# Patient Record
Sex: Female | Born: 1945
Health system: Southern US, Community
[De-identification: ages and names within clinical notes are randomized; demographics above are authoritative.]

## PROBLEM LIST (undated history)

## (undated) DIAGNOSIS — I10 Essential (primary) hypertension: Secondary | ICD-10-CM

## (undated) DIAGNOSIS — I491 Atrial premature depolarization: Secondary | ICD-10-CM

## (undated) DIAGNOSIS — K759 Inflammatory liver disease, unspecified: Secondary | ICD-10-CM

## (undated) DIAGNOSIS — E785 Hyperlipidemia, unspecified: Secondary | ICD-10-CM

## (undated) DIAGNOSIS — I4891 Unspecified atrial fibrillation: Secondary | ICD-10-CM

## (undated) DIAGNOSIS — I219 Acute myocardial infarction, unspecified: Secondary | ICD-10-CM

## (undated) DIAGNOSIS — I639 Cerebral infarction, unspecified: Secondary | ICD-10-CM

## (undated) DIAGNOSIS — Z87442 Personal history of urinary calculi: Secondary | ICD-10-CM

## (undated) HISTORY — DX: Hyperlipidemia, unspecified: E78.5

## (undated) HISTORY — DX: Unspecified atrial fibrillation: I48.91

## (undated) HISTORY — DX: Atrial premature depolarization: I49.1

## (undated) HISTORY — DX: Cerebral infarction, unspecified: I63.9

## (undated) HISTORY — DX: Essential (primary) hypertension: I10

## (undated) HISTORY — DX: Inflammatory liver disease, unspecified: K75.9

## (undated) HISTORY — PX: SHOULDER ARTHROSCOPY W/ ROTATOR CUFF REPAIR: SHX2400

## (undated) HISTORY — PX: HEMORROIDECTOMY: SUR656

## (undated) HISTORY — PX: TOTAL HIP ARTHROPLASTY: SHX124

---

## 2008-09-07 ENCOUNTER — Encounter: Admission: RE | Admit: 2008-09-07 | Discharge: 2008-09-07 | Payer: Self-pay | Admitting: Orthopedic Surgery

## 2009-02-03 ENCOUNTER — Inpatient Hospital Stay (HOSPITAL_COMMUNITY): Admission: RE | Admit: 2009-02-03 | Discharge: 2009-02-07 | Payer: Self-pay | Admitting: Neurosurgery

## 2009-02-03 ENCOUNTER — Ambulatory Visit: Payer: Self-pay | Admitting: Internal Medicine

## 2009-02-03 ENCOUNTER — Ambulatory Visit: Payer: Self-pay | Admitting: Cardiology

## 2009-02-04 ENCOUNTER — Encounter: Payer: Self-pay | Admitting: Pulmonary Disease

## 2009-02-07 ENCOUNTER — Encounter (INDEPENDENT_AMBULATORY_CARE_PROVIDER_SITE_OTHER): Payer: Self-pay | Admitting: Neurosurgery

## 2009-03-04 DIAGNOSIS — I471 Supraventricular tachycardia: Secondary | ICD-10-CM | POA: Insufficient documentation

## 2009-03-04 DIAGNOSIS — R7881 Bacteremia: Secondary | ICD-10-CM | POA: Insufficient documentation

## 2009-03-04 DIAGNOSIS — K759 Inflammatory liver disease, unspecified: Secondary | ICD-10-CM | POA: Insufficient documentation

## 2009-03-04 DIAGNOSIS — E785 Hyperlipidemia, unspecified: Secondary | ICD-10-CM | POA: Insufficient documentation

## 2009-03-04 DIAGNOSIS — I10 Essential (primary) hypertension: Secondary | ICD-10-CM | POA: Insufficient documentation

## 2009-03-07 ENCOUNTER — Encounter: Payer: Self-pay | Admitting: Internal Medicine

## 2009-03-07 ENCOUNTER — Ambulatory Visit: Payer: Self-pay | Admitting: Internal Medicine

## 2009-05-05 ENCOUNTER — Ambulatory Visit: Payer: Self-pay | Admitting: Internal Medicine

## 2009-12-16 ENCOUNTER — Encounter: Admission: RE | Admit: 2009-12-16 | Discharge: 2009-12-16 | Payer: Self-pay | Admitting: Neurosurgery

## 2010-03-28 ENCOUNTER — Telehealth: Payer: Self-pay | Admitting: Internal Medicine

## 2010-05-03 ENCOUNTER — Ambulatory Visit: Payer: Self-pay | Admitting: Internal Medicine

## 2010-07-10 ENCOUNTER — Telehealth: Payer: Self-pay | Admitting: Internal Medicine

## 2010-07-17 ENCOUNTER — Ambulatory Visit: Payer: Self-pay | Admitting: Internal Medicine

## 2010-07-17 DIAGNOSIS — R0789 Other chest pain: Secondary | ICD-10-CM | POA: Insufficient documentation

## 2010-07-20 ENCOUNTER — Telehealth (INDEPENDENT_AMBULATORY_CARE_PROVIDER_SITE_OTHER): Payer: Self-pay | Admitting: *Deleted

## 2010-07-24 ENCOUNTER — Encounter: Payer: Self-pay | Admitting: Internal Medicine

## 2010-07-24 ENCOUNTER — Ambulatory Visit: Payer: Self-pay

## 2010-07-24 ENCOUNTER — Encounter (HOSPITAL_COMMUNITY): Admission: RE | Admit: 2010-07-24 | Discharge: 2010-08-24 | Payer: Self-pay | Admitting: Internal Medicine

## 2010-07-24 ENCOUNTER — Ambulatory Visit: Payer: Self-pay | Admitting: Internal Medicine

## 2010-10-18 ENCOUNTER — Ambulatory Visit: Payer: Self-pay | Admitting: Internal Medicine

## 2011-01-02 NOTE — Assessment & Plan Note (Signed)
Summary: Cardiology Nuclear Testing  Nuclear Med Background Indications for Stress Test: Evaluation for Ischemia   History: Echo, GXT  History Comments: '10 Echo:normal  Symptoms: Chest Pain, Diaphoresis, Fatigue, Palpitations, Rapid HR  Symptoms Comments: Last episode of CP:3 weeks ago.   Nuclear Pre-Procedure Cardiac Risk Factors: Family History - CAD, Hypertension, Lipids, Smoker Caffeine/Decaff Intake: None NPO After: 5:30 AM Lungs: Clear IV 0.9% NS with Angio Cath: 22g     IV Site: (R) Forearm IV Started by: Irean Hong RN Chest Size (in) 34     Cup Size B     Height (in): 66 Weight (lb): 132 BMI: 21.38 Tech Comments: Held Diltiazem x 48 hours per MD. Patient had multiple runs of SVT with exercise and in recovery.  Spoke with Dr. Johney Frame, he said for patient to go back on her Diltiazem.  Advised patient, she had not started the 240mg  that he increased her to yet because it was mail ordered, she's still on the 180mg .  [employer], CMA-N  Nuclear Med Study 1 or 2 day study:  1 day     Stress Test Type:  Stress Reading MD:  Arvilla Meres, MD     Referring MD:  Hillis Range, MD Resting Radionuclide:  Technetium 92m Tetrofosmin     Resting Radionuclide Dose:  11 mCi  Stress Radionuclide:  Technetium 3m Tetrofosmin     Stress Radionuclide Dose:  33 mCi   Stress Protocol Exercise Time (min):  7:01 min     Max HR:  153 bpm     Predicted Max HR:  156 bpm  Max Systolic BP: 175 mm Hg     Percent Max HR:  98.08 %     METS: 8.5 Rate Pressure Product:  09811    Stress Test Technologist:  [employer] CMA-N     Nuclear Technologist:  Domenic Polite CNMT  Rest Procedure  Myocardial perfusion imaging was performed at rest 45 minutes following the intravenous administration of Myoview Technetium 44m Tetrofosmin.  Stress Procedure  The patient exercised for 7:01.  The patient stopped due to fatigue and denied any chest pain.  There were no diagnostic ST-T wave changes, but she  did have multiple runs of SVT.  Myoview was injected at peak exercise and myocardial perfusion imaging was performed after a brief delay.  QPS Raw Data Images:  Normal; no motion artifact; normal heart/lung ratio. Stress Images:  Mildly decreased uptake in the inferior wall and inferoseptum. Rest Images:  Mildly decreased uptake in the inferoseptum septum Subtraction (SDS):  Possible inferoseptal infarct with very mild ischemia in the inferior wall and septum. Transient Ischemic Dilatation:  .92  (Normal <1.22)  Lung/Heart Ratio:  .26  (Normal <0.45)  Quantitative Gated Spect Images QGS EDV:  53 ml QGS ESV:  17 ml QGS EF:  69 % QGS cine images:  Normal  Findings Abormal nuclear study Evidence for inferior ischemia      Overall Impression  Exercise Capacity: Fair exercise capacity. BP Response: Normal blood pressure response. Clinical Symptoms: There is dyspnea. ECG Impression: Insignificant upsloping ST segment depression. Very brief runs of SVT. Overall Impression: Abnormal stress nuclear study. Overall Impression Comments: Possible inferoseptal infarct with very mild ischemia in the inferior wall and septum.  Appended Document: Cardiology Nuclear Testing Low risk myoview.  Continue current management. She had frequent episodes of nonsustained atrial tachycardia during the study. We have increased cardizem recently to 240mg  daily.  IF her episodes of palpitations continue or worsen, we should  consider increase cardizem to 360mg  daily.  SHe may also require an antiarrhythmic medicine at that time.  I am scheduled to see her in Nov.  She should contact us if things worsen and we could see her sooner.  Appended Document: Cardiology Nuclear Testing increased dose of Diltiazem 240mg  was just started on 07/25/10 per pt

## 2011-01-02 NOTE — Progress Notes (Signed)
Summary: Nuc Pre-Procedure   Nuclear Med Background Indications for Stress Test: Evaluation for Ischemia   History: Echo  History Comments: 2010 Echo- Ef-Nml  Symptoms: Chest Pain, Diaphoresis, Fatigue    Nuclear Pre-Procedure Cardiac Risk Factors: Hypertension, Lipids Height (in): 66

## 2011-01-02 NOTE — Progress Notes (Signed)
Summary: Nuc Pre-Procedure  Phone Note Outgoing Call Call back at [facility] Phone [phone]   Call placed by: Antionette Char RN,  July 20, 2010 1:54 PM Call placed to: Patient Reason for Call: Confirm/change Appt Summary of Call: Reviewed information on Myoview Information Sheet (see scanned document for further details).  Spoke with patient.

## 2011-01-02 NOTE — Assessment & Plan Note (Signed)
Summary: rov.appt is 1:30/ gd   Visit Type:  Follow-up Primary Provider:  Abner Greenspan, MD   History of Present Illness: The patient presents today for cardiology followup. She reports having tachy palpitations 2 weeks ago, lasting 1 hour with diaphoresis while eating dinner.  This episode resolved spontaneously.  She also reports sharp fleeting chest pain intermittently over the past 2 weeks with associated fatigue.   The patient denies any symptoms of  shortness of breath, orthopnea, PND, lower extremity edema, dizziness, presyncope, syncope, or neurologic sequela. The patient is tolerating medications without difficulties and is otherwise without complaint today.   Current Medications (verified): 1)  Lisinopril 10 Mg Tabs (Lisinopril) .... Take One Tablet By Mouth Daily 2)  Ursodiol 300 Mg Caps (Ursodiol) .Marland Kitchen.. 1 By Mouth Once Daily 3)  Acyclovir 200 Mg Caps (Acyclovir) .... 2 Tabs Once Daily 4)  Ra Acid Reducer Max St 150 Mg Tabs (Ranitidine Hcl) .Marland Kitchen.. 1 By Mouth Once Daily 5)  Bayer Aspirin 325 Mg Tabs (Aspirin) .... Take One Tablet Once Daily 6)  Multivitamins   Tabs (Multiple Vitamin) .Marland Kitchen.. 1 By Mouth Once Daily 7)  Calcium/vitamin D/minerals 600-200 Mg-Unit Tabs (Calcium Carbonate-Vit D-Min) .... 2 Tabs Daily 8)  Actonel 150 Mg Tabs (Risedronate Sodium) .Marland Kitchen.. 1 By Mouth Qmonth 9)  Prempro 0.625-2.5 Mg Tabs (Conj Estrog-Medroxyprogest Ace) .... Once Daily 10)  Diltiazem Hcl Er Beads 180 Mg Xr24h-Cap (Diltiazem Hcl Er Beads) .... Take One Capsule By Mouth Daily 11)  Welchol 625 Mg Tabs (Colesevelam Hcl) .... 6 Tabs Once Daily 12)  Fish Oil   Oil (Fish Oil) .... 3 Capsules Once Daily  Allergies (verified): No Known Drug Allergies  Past History:  Past Medical History: Reviewed history from 05/05/2009 and no changes required. HEPATITIS (ICD-573.3) HYPERLIPIDEMIA-MIXED (ICD-272.4) HYPERTENSION, UNSPECIFIED (ICD-401.9) POST-OPERATIVE ATRIAL FIBRILLATION (PAROXYSMAL) SYMPTOMATIC  PACS  Past Surgical History: Reviewed history from 03/07/2009 and no changes required.  Status post anterolateral decompression L2-L3 and L3-L4 with diskectomy and plating under general anesthesia on February 03, 2009.      Social History: Reviewed history from 03/04/2009 and no changes required.   She lives in [place], [place]   [place].  Married  Tobacco Use - No.  Alcohol Use - no  Review of Systems       All systems are reviewed and negative except as listed in the HPI.   Vital Signs:  Patient profile:   65 year old female Height:      66 inches Weight:      135 pounds BMI:     21.87 Pulse rate:   84 / minute BP sitting:   118 / 70  (left arm)  Vitals Entered By: Laurance Flatten CMA (July 17, 2010 1:54 PM)  Physical Exam  General:  Well developed, well nourished, in no acute distress. Head:  normocephalic and atraumatic Eyes:  PERRLA/EOM intact; conjunctiva and lids normal. Mouth:  Teeth, gums and palate normal. Oral mucosa normal. Neck:  Neck supple, no JVD. No masses, thyromegaly or abnormal cervical nodes. Lungs:  Clear bilaterally to auscultation and percussion. Heart:  Non-displaced PMI, chest non-tender; regular rate and rhythm, S1, S2 without murmurs, rubs or gallops. Carotid upstroke normal, no bruit. Normal abdominal aortic size, no bruits. Femorals normal pulses, no bruits. Pedals normal pulses. No edema, no varicosities. Abdomen:  Bowel sounds positive; abdomen soft and non-tender without masses, organomegaly, or hernias noted. No hepatosplenomegaly. Msk:  Back normal, normal gait. Muscle strength and tone normal. Pulses:  pulses normal  in all 4 extremities Extremities:  No clubbing or cyanosis. Neurologic:  Alert and oriented x 3. Psych:  very anxious today, mildly pressured speech   EKG  Procedure date:  07/17/2010  Findings:      sinus rhythm 86 bpm, otherwise normal ekg  Impression & Recommendations:  Problem # 1:  ATRIAL FIBRILLATION  (ICD-427.31)  Recent palpitations are likely due to afib.  I will continue ASA and increase cardizem DC to 240mg  daily today.  If she has further palpitations, we will consider event monitor placement.  Her updated medication list for this problem includes:    Bayer Aspirin 325 Mg Tabs (Aspirin) .Marland Kitchen... Take one tablet once daily  Problem # 2:  OTHER CHEST PAIN (ICD-786.59)  The patient has symptoms of atypical chest pain.  We will obtain GXT Myoview to evaluate for ischemia as a cause.  Her updated medication list for this problem includes:    Lisinopril 10 Mg Tabs (Lisinopril) .Marland Kitchen... Take one tablet by mouth daily    Bayer Aspirin 325 Mg Tabs (Aspirin) .Marland Kitchen... Take one tablet once daily    Diltiazem Hcl Er Beads 240 Mg Xr24h-cap (Diltiazem hcl er beads) .Marland Kitchen... Take one capsule by mouth daily  Problem # 3:  HYPERLIPIDEMIA-MIXED (ICD-272.4)  stable Her updated medication list for this problem includes:    Welchol 625 Mg Tabs (Colesevelam hcl) .Marland KitchenMarland KitchenMarland KitchenMarland Kitchen 6 tabs once daily  Her updated medication list for this problem includes:    Welchol 625 Mg Tabs (Colesevelam hcl) .Marland KitchenMarland KitchenMarland KitchenMarland Kitchen 6 tabs once daily  Other Orders: EKG w/ Interpretation (93000) Nuclear Stress Test (Nuc Stress Test)  Patient Instructions: 1)  Your physician recommends that you schedule a follow-up appointment in: 3 months with Dr Johney Frame 2)  Your physician has requested that you have an exercise stress myoview.  For further information please visit [email or web address]  Please follow instruction sheet, as given. 3)  Your physician has recommended you make the following change in your medication: increase Cardizem to 240mg  daily Prescriptions: DILTIAZEM HCL ER BEADS 240 MG XR24H-CAP (DILTIAZEM HCL ER BEADS) Take one capsule by mouth daily  #90 x 3   Entered by:   Dennis Bast, RN, BSN   Authorized by:   Hillis Range, MD   Signed by:   Dennis Bast, RN, BSN on 07/17/2010   Method used:   Faxed to ...       [facility] (mail-order)              , [place]         Ph: [phone]       Fax: [phone]   RxID:   [id number]

## 2011-01-02 NOTE — Assessment & Plan Note (Signed)
Summary: yearly   Visit Type:  Follow-up Primary Provider:  Abner Greenspan, MD   History of Present Illness: The patient presents today for routine electrophysiology followup. She reports doing very well since last being seen in our clinic. SHe has had no symptomatic atrial fibrillation over the past year.  Her palpitations/ PACs have significantly improved with diltiazem.  The patient denies any symptoms of palpitations, chest pain, shortness of, orthopnea, PND, lower extremity edema, dizziness, presyncope, syncope, or neurologic sequela. The patient is tolerating medications without difficulties and is otherwise without complaint today.   Current Medications (verified): 1)  Lisinopril 10 Mg Tabs (Lisinopril) .... Take One Tablet By Mouth Daily 2)  Ursodiol 300 Mg Caps (Ursodiol) .Marland Kitchen.. 1 By Mouth Once Daily 3)  Acyclovir 200 Mg Caps (Acyclovir) .... 2 Tabs Once Daily 4)  Ra Acid Reducer Max St 150 Mg Tabs (Ranitidine Hcl) .Marland Kitchen.. 1 By Mouth Once Daily 5)  Bayer Aspirin 325 Mg Tabs (Aspirin) .... Take One Tablet Once Daily 6)  Multivitamins   Tabs (Multiple Vitamin) .Marland Kitchen.. 1 By Mouth Once Daily 7)  Calcium/vitamin D/minerals 600-200 Mg-Unit Tabs (Calcium Carbonate-Vit D-Min) .... 2 Tabs Daily 8)  Actonel 150 Mg Tabs (Risedronate Sodium) .Marland Kitchen.. 1 By Mouth Qmonth 9)  Prempro 0.625-2.5 Mg Tabs (Conj Estrog-Medroxyprogest Ace) .... Once Daily 10)  Diltiazem Hcl Er Beads 180 Mg Xr24h-Cap (Diltiazem Hcl Er Beads) .... Take One Capsule By Mouth Daily 11)  Welchol 625 Mg Tabs (Colesevelam Hcl) .... 6 Tabs Once Daily 12)  Lyrica 75 Mg Caps (Pregabalin) .Marland Kitchen.. 1 Capsule Once Daily 13)  Fish Oil   Oil (Fish Oil) .... 3 Capsules Once Daily  Allergies (verified): No Known Drug Allergies  Past History:  Past Medical History: Reviewed history from 05/05/2009 and no changes required. HEPATITIS (ICD-573.3) HYPERLIPIDEMIA-MIXED (ICD-272.4) HYPERTENSION, UNSPECIFIED (ICD-401.9) POST-OPERATIVE ATRIAL FIBRILLATION  (PAROXYSMAL) SYMPTOMATIC PACS  Past Surgical History: Reviewed history from 03/07/2009 and no changes required.  Status post anterolateral decompression L2-L3 and L3-L4 with diskectomy and plating under general anesthesia on February 03, 2009.      Vital Signs:  Patient profile:   65 year old female Height:      66 inches Weight:      140 pounds BMI:     22.68 Pulse rate:   90 / minute Pulse rhythm:   regular BP sitting:   130 / 82  (left arm)  Vitals Entered By: Laurance Flatten CMA (May 03, 2010 11:10 AM)  Physical Exam  General:  Well developed, well nourished, in no acute distress. Head:  normocephalic and atraumatic Eyes:  PERRLA/EOM intact; conjunctiva and lids normal. Mouth:  Teeth, gums and palate normal. Oral mucosa normal. Neck:  Neck supple, no JVD. No masses, thyromegaly or abnormal cervical nodes. Lungs:  Clear bilaterally to auscultation and percussion. Heart:  Non-displaced PMI, chest non-tender; regular rate and rhythm, S1, S2 without murmurs, rubs or gallops. Carotid upstroke normal, no bruit. Normal abdominal aortic size, no bruits. Femorals normal pulses, no bruits. Pedals normal pulses. No edema, no varicosities. Abdomen:  Bowel sounds positive; abdomen soft and non-tender without masses, organomegaly, or hernias noted. No hepatosplenomegaly. Msk:  Back normal, normal gait. Muscle strength and tone normal. Pulses:  pulses normal in all 4 extremities Extremities:  No clubbing or cyanosis. Neurologic:  Alert and oriented x 3.   EKG  Procedure date:  05/03/2010  Findings:      sinus rhythm 90 bpm, otherwise normal ekg  Impression & Recommendations:  Problem #  1:  ATRIAL FIBRILLATION (ICD-427.31) The patient has had no further symptomatic episodes of atrial fibrillation. She reports pacs/ palpitations are much improved with diltiazem.  I have therefore continued diltiazem longterm.  Her CHADS2 score is 1 and she is appropriately anticoagulated with aspirin.  We  discussed both pradaxa and coumadin as alternatives to aspirin.  She is clear in her decision to continue ASA.  No other changes were made today.  Problem # 2:  HYPERTENSION, UNSPECIFIED (ICD-401.9) stable no changes today  Patient Instructions: 1)  return in 12 months

## 2011-01-02 NOTE — Progress Notes (Signed)
Summary: pt wants sooner appt  Phone Note Call from Patient Call back at Home Phone [phone]   Caller: Patient Reason for Call: Talk to Nurse, Talk to Doctor Summary of Call: pt wants to come in to be seen before first avail. When I asked what problem was she having she said something is not right in her chest and she just needs to see him and she knows it. I asked was she having chest pain and she said no. Initial call taken by: Omer Jack,  July 10, 2010 10:41 AM  Follow-up for Phone Call        Mon at 1:30 to see Dr Johney Frame Dennis Bast, RN, BSN  July 12, 2010 9:28 AM

## 2011-01-02 NOTE — Assessment & Plan Note (Signed)
Summary: 3 [email or web address]   Visit Type:  Follow-up Primary Provider:  Abner Greenspan, MD  CC:  3 month ROV; No complaints.  History of Present Illness: The patient presents today for cardiology followup. She reports very well since last being seen in our office. She reports short fleeting palpitations but denies and prolonged episodes of tachycardia.  She feels that her rhythm is controlled with cardizem.   The patient denies any symptoms of chest pain, shortness of breath, orthopnea, PND, lower extremity edema, dizziness, presyncope, syncope, or neurologic sequela. The patient is tolerating medications without difficulties and is otherwise without complaint today.   Problems Prior to Update: 1)  Other Chest Pain  (ICD-786.59) 2)  Hyperlipidemia-mixed  (ICD-272.4) 3)  Atrial Fibrillation  (ICD-427.31) 4)  Hepatitis  (ICD-573.3) 5)  Hyperlipidemia-mixed  (ICD-272.4) 6)  Hypertension, Unspecified  (ICD-401.9) 7)  Atrial Fibrillation With Rapid Ventricular Response  (ICD-427.31) 8)  Bacteremia  (ICD-790.7)  Medications Prior to Update: 1)  Lisinopril 10 Mg Tabs (Lisinopril) .... Take One Tablet By Mouth Daily 2)  Ursodiol 300 Mg Caps (Ursodiol) .Marland Kitchen.. 1 By Mouth Once Daily 3)  Acyclovir 200 Mg Caps (Acyclovir) .... 2 Tabs Once Daily 4)  Ra Acid Reducer Max St 150 Mg Tabs (Ranitidine Hcl) .Marland Kitchen.. 1 By Mouth Once Daily 5)  Bayer Aspirin 325 Mg Tabs (Aspirin) .... Take One Tablet Once Daily 6)  Multivitamins   Tabs (Multiple Vitamin) .Marland Kitchen.. 1 By Mouth Once Daily 7)  Calcium/vitamin D/minerals 600-200 Mg-Unit Tabs (Calcium Carbonate-Vit D-Min) .... 2 Tabs Daily 8)  Actonel 150 Mg Tabs (Risedronate Sodium) .Marland Kitchen.. 1 By Mouth Qmonth 9)  Prempro 0.625-2.5 Mg Tabs (Conj Estrog-Medroxyprogest Ace) .... Once Daily 10)  Diltiazem Hcl Er Beads 240 Mg Xr24h-Cap (Diltiazem Hcl Er Beads) .... Take One Capsule By Mouth Daily 11)  Welchol 625 Mg Tabs (Colesevelam Hcl) .... 6 Tabs Once Daily 12)  Fish Oil   Oil (Fish  Oil) .... 3 Capsules Once Daily  Current Medications (verified): 1)  Lisinopril 10 Mg Tabs (Lisinopril) .... Take One Tablet By Mouth Daily 2)  Ursodiol 300 Mg Caps (Ursodiol) .Marland Kitchen.. 1 By Mouth Once Daily 3)  Acyclovir 200 Mg Caps (Acyclovir) .... 2 Tabs Once Daily 4)  Ra Acid Reducer Max St 150 Mg Tabs (Ranitidine Hcl) .Marland Kitchen.. 1 By Mouth Once Daily 5)  Bayer Aspirin 325 Mg Tabs (Aspirin) .... Take One Tablet Once Daily 6)  Multivitamins   Tabs (Multiple Vitamin) .Marland Kitchen.. 1 By Mouth Once Daily 7)  Calcium/vitamin D/minerals 600-200 Mg-Unit Tabs (Calcium Carbonate-Vit D-Min) .... 2 Tabs Daily 8)  Actonel 150 Mg Tabs (Risedronate Sodium) .Marland Kitchen.. 1 By Mouth Qmonth 9)  Prempro 0.625-2.5 Mg Tabs (Conj Estrog-Medroxyprogest Ace) .... Once Daily 10)  Diltiazem Hcl Er Beads 240 Mg Xr24h-Cap (Diltiazem Hcl Er Beads) .... Take One Capsule By Mouth Daily 11)  Welchol 625 Mg Tabs (Colesevelam Hcl) .... 6 Tabs Once Daily 12)  Fish Oil   Oil (Fish Oil) .... 3 Capsules Once Daily  Allergies (verified): No Known Drug Allergies  Past History:  Past Medical History: HEPATITIS (ICD-573.3) HYPERLIPIDEMIA-MIXED (ICD-272.4) HYPERTENSION, UNSPECIFIED (ICD-401.9) POST-OPERATIVE ATRIAL FIBRILLATION (PAROXYSMAL) SYMPTOMATIC PACS and recently documented SVT (likley atrial tachycardia)  Past Surgical History: Reviewed history from 03/07/2009 and no changes required.  Status post anterolateral decompression L2-L3 and L3-L4 with diskectomy and plating under general anesthesia on February 03, 2009.      Social History: Reviewed history from 03/04/2009 and no changes required.   She lives in [place], [place]  [place].  Married  Tobacco Use - No.  Alcohol Use - no  Review of Systems       All systems are reviewed and negative except as listed in the HPI.   Vital Signs:  Patient profile:   65 year old female Height:      64 inches Weight:      136 pounds Pulse rate:   98 / minute BP sitting:   132 / 78  (left  arm) Cuff size:   regular  Vitals Entered By: Stanton Kidney, EMT-P (October 18, 2010 10:12 AM)  Physical Exam  General:  Well developed, well nourished, in no acute distress. Head:  normocephalic and atraumatic Eyes:  PERRLA/EOM intact; conjunctiva and lids normal. Mouth:  Teeth, gums and palate normal. Oral mucosa normal. Neck:  Neck supple, no JVD. No masses, thyromegaly or abnormal cervical nodes. Lungs:  Clear Heart:  Non-displaced PMI, chest non-tender; regular rate and rhythm, S1, S2 without murmurs, rubs or gallops. Carotid upstroke normal, no bruit. Normal abdominal aortic size, no bruits. Femorals normal pulses, no bruits. Pedals normal pulses. No edema, no varicosities. Abdomen:  Bowel sounds positive; abdomen soft and non-tender without masses, organomegaly, or hernias noted. No hepatosplenomegaly. Msk:  Back normal, normal gait. Muscle strength and tone normal. Pulses:  pulses normal in all 4 extremities Extremities:  No clubbing or cyanosis. Neurologic:  Alert and oriented x 3.   EKG  Procedure date:  10/18/2010  Findings:      sinus with occasional PACs otherwise normal ekg  Impression & Recommendations:  Problem # 1:  ATRIAL FIBRILLATION (ICD-427.31) The patient has a h/o afib for which she is taking ASA. She recently underwent stress testing during which she had a narrow complex irrgular tachycardia which appears to have been nonsustained atrial tachycardia.   She feels that her symptoms are presently controlled with cardizem and does not want to consider AAD at this time.  Problem # 2:  OTHER CHEST PAIN (ICD-786.59) resolved recent nuclear study was low risk no further testing is planned at this time she will contact me if symptoms of ischemia occur  Problem # 3:  HYPERTENSION, UNSPECIFIED (ICD-401.9) stable  Problem # 4:  HYPERLIPIDEMIA-MIXED (ICD-272.4) stable Her updated medication list for this problem includes:    Welchol 625 Mg Tabs (Colesevelam  hcl) .Marland KitchenMarland KitchenMarland KitchenMarland Kitchen 6 tabs once daily  Patient Instructions: 1)  Your physician wants you to follow-up in: 12 months with Dr Jacquiline Doe will receive a reminder letter in the mail two months in advance. If you don't receive a letter, please call our office to schedule the follow-up appointment.

## 2011-01-02 NOTE — Progress Notes (Signed)
Summary: refill**mail to pt**  Phone Note Refill Request Message from:  Patient on March 28, 2010 11:03 AM  Refills Requested: Medication #1:  DILTIAZEM HCL ER BEADS 180 MG XR24H-CAP Take one capsule by mouth daily   Supply Requested: 3 months Please send written rx to pt, needs 90day supply   Method Requested: Mail to Patient Initial call taken by: Migdalia Dk,  March 28, 2010 11:05 AM  Follow-up for Phone Call        Rx faxed to pharmacy    Prescriptions: DILTIAZEM HCL ER BEADS 180 MG XR24H-CAP (DILTIAZEM HCL ER BEADS) Take one capsule by mouth daily  #90 x 3   Entered by:   Laurance Flatten CMA   Authorized by:   Hillis Range, MD   Signed by:   Laurance Flatten CMA on 03/31/2010   Method used:   Print then Mail to Patient   RxID:   [id number] DILTIAZEM HCL ER BEADS 180 MG XR24H-CAP (DILTIAZEM HCL ER BEADS) Take one capsule by mouth daily  #90 x 3   Entered by:   Laurance Flatten CMA   Authorized by:   Hillis Range, MD   Signed by:   Laurance Flatten CMA on 03/29/2010   Method used:   Faxed to ...       [facility] (mail-order)             , [place]         Ph:        Fax: [phone]   RxID:   [id number]

## 2011-01-08 ENCOUNTER — Other Ambulatory Visit (HOSPITAL_COMMUNITY): Payer: Self-pay | Admitting: Neurosurgery

## 2011-01-08 DIAGNOSIS — M545 Low back pain, unspecified: Secondary | ICD-10-CM

## 2011-01-08 DIAGNOSIS — M546 Pain in thoracic spine: Secondary | ICD-10-CM

## 2011-01-10 ENCOUNTER — Ambulatory Visit (HOSPITAL_COMMUNITY)
Admission: RE | Admit: 2011-01-10 | Discharge: 2011-01-10 | Disposition: A | Discharge status: Home / Self Care | Payer: 59 | Source: Ambulatory Visit | Attending: Neurosurgery | Admitting: Neurosurgery

## 2011-01-10 ENCOUNTER — Other Ambulatory Visit (HOSPITAL_COMMUNITY): Payer: Self-pay

## 2011-01-10 DIAGNOSIS — M545 Low back pain, unspecified: Secondary | ICD-10-CM | POA: Insufficient documentation

## 2011-01-10 DIAGNOSIS — M47817 Spondylosis without myelopathy or radiculopathy, lumbosacral region: Secondary | ICD-10-CM | POA: Insufficient documentation

## 2011-01-10 DIAGNOSIS — J984 Other disorders of lung: Secondary | ICD-10-CM | POA: Insufficient documentation

## 2011-01-10 DIAGNOSIS — M4120 Other idiopathic scoliosis, site unspecified: Secondary | ICD-10-CM | POA: Insufficient documentation

## 2011-01-10 DIAGNOSIS — M51379 Other intervertebral disc degeneration, lumbosacral region without mention of lumbar back pain or lower extremity pain: Secondary | ICD-10-CM | POA: Insufficient documentation

## 2011-01-10 DIAGNOSIS — M79609 Pain in unspecified limb: Secondary | ICD-10-CM | POA: Insufficient documentation

## 2011-01-10 DIAGNOSIS — M5137 Other intervertebral disc degeneration, lumbosacral region: Secondary | ICD-10-CM | POA: Insufficient documentation

## 2011-01-10 DIAGNOSIS — M546 Pain in thoracic spine: Secondary | ICD-10-CM

## 2011-01-10 DIAGNOSIS — M519 Unspecified thoracic, thoracolumbar and lumbosacral intervertebral disc disorder: Secondary | ICD-10-CM | POA: Insufficient documentation

## 2011-01-10 DIAGNOSIS — I700 Atherosclerosis of aorta: Secondary | ICD-10-CM | POA: Insufficient documentation

## 2011-01-10 DIAGNOSIS — M4804 Spinal stenosis, thoracic region: Secondary | ICD-10-CM | POA: Insufficient documentation

## 2011-01-10 MED ORDER — IOHEXOL 300 MG/ML  SOLN
10.0000 mL | Freq: Once | INTRAMUSCULAR | Status: AC | PRN
  Administered 2011-01-10: 10 mL via INTRATHECAL

## 2011-01-19 ENCOUNTER — Telehealth (INDEPENDENT_AMBULATORY_CARE_PROVIDER_SITE_OTHER): Payer: Self-pay | Admitting: *Deleted

## 2011-01-24 NOTE — Progress Notes (Signed)
Summary: Records Request  Faxed OV, EKG & Stress to [facility] ([phone]). Debby Freiberg  January 19, 2011 12:33 PM

## 2011-02-04 HISTORY — PX: LUMBAR LAMINECTOMY/DECOMPRESSION MICRODISCECTOMY: SHX5026

## 2011-03-15 LAB — CULTURE, BLOOD (ROUTINE X 2): Culture: NO GROWTH

## 2011-03-15 LAB — URINALYSIS, ROUTINE W REFLEX MICROSCOPIC
Nitrite: NEGATIVE
Protein, ur: NEGATIVE mg/dL
Urobilinogen, UA: 0.2 mg/dL (ref 0.0–1.0)

## 2011-03-15 LAB — CBC
HCT: 34.3 % — ABNORMAL LOW (ref 36.0–46.0)
Hemoglobin: 11.6 g/dL — ABNORMAL LOW (ref 12.0–15.0)
Hemoglobin: 12.2 g/dL (ref 12.0–15.0)
MCHC: 35 g/dL (ref 30.0–36.0)
MCHC: 35 g/dL (ref 30.0–36.0)
MCHC: 35 g/dL (ref 30.0–36.0)
MCV: 101.2 fL — ABNORMAL HIGH (ref 78.0–100.0)
MCV: 101.9 fL — ABNORMAL HIGH (ref 78.0–100.0)
Platelets: 212 10*3/uL (ref 150–400)
Platelets: 230 10*3/uL (ref 150–400)
Platelets: 245 10*3/uL (ref 150–400)
Platelets: 283 10*3/uL (ref 150–400)
RBC: 3.29 MIL/uL — ABNORMAL LOW (ref 3.87–5.11)
RBC: 3.91 MIL/uL (ref 3.87–5.11)
RDW: 13.2 % (ref 11.5–15.5)
RDW: 13.2 % (ref 11.5–15.5)
WBC: 10.3 10*3/uL (ref 4.0–10.5)
WBC: 13.9 10*3/uL — ABNORMAL HIGH (ref 4.0–10.5)
WBC: 8.3 10*3/uL (ref 4.0–10.5)

## 2011-03-15 LAB — BASIC METABOLIC PANEL
BUN: 8 mg/dL (ref 6–23)
BUN: 8 mg/dL (ref 6–23)
CO2: 22 mEq/L (ref 19–32)
CO2: 24 mEq/L (ref 19–32)
CO2: 25 mEq/L (ref 19–32)
CO2: 26 mEq/L (ref 19–32)
Calcium: 7.9 mg/dL — ABNORMAL LOW (ref 8.4–10.5)
Calcium: 8.2 mg/dL — ABNORMAL LOW (ref 8.4–10.5)
Calcium: 8.3 mg/dL — ABNORMAL LOW (ref 8.4–10.5)
Calcium: 8.4 mg/dL (ref 8.4–10.5)
Calcium: 9.4 mg/dL (ref 8.4–10.5)
Calcium: 9.7 mg/dL (ref 8.4–10.5)
Chloride: 107 mEq/L (ref 96–112)
Creatinine, Ser: 0.7 mg/dL (ref 0.4–1.2)
Creatinine, Ser: 0.83 mg/dL (ref 0.4–1.2)
Creatinine, Ser: 0.84 mg/dL (ref 0.4–1.2)
Creatinine, Ser: 0.92 mg/dL (ref 0.4–1.2)
Creatinine, Ser: 0.98 mg/dL (ref 0.4–1.2)
GFR calc Af Amer: >60 mL/min (ref >60)
GFR calc Af Amer: >60 mL/min (ref >60)
GFR calc non Af Amer: 58 mL/min — ABNORMAL LOW (ref >60)
GFR calc non Af Amer: >60 mL/min (ref >60)
GFR calc non Af Amer: >60 mL/min (ref >60)
GFR calc non Af Amer: >60 mL/min (ref >60)
GFR calc non Af Amer: >60 mL/min (ref >60)
GFR calc non Af Amer: >60 mL/min (ref >60)
Glucose, Bld: 125 mg/dL — ABNORMAL HIGH (ref 70–99)
Glucose, Bld: 135 mg/dL — ABNORMAL HIGH (ref 70–99)
Glucose, Bld: 90 mg/dL (ref 70–99)
Glucose, Bld: 99 mg/dL (ref 70–99)
Potassium: 3.9 mEq/L (ref 3.5–5.1)
Sodium: 134 mEq/L — ABNORMAL LOW (ref 135–145)
Sodium: 141 mEq/L (ref 135–145)

## 2011-03-15 LAB — URINE MICROSCOPIC-ADD ON

## 2011-03-15 LAB — HEPATIC FUNCTION PANEL: Alkaline Phosphatase: 70 U/L (ref 39–117)

## 2011-03-15 LAB — CARDIAC PANEL(CRET KIN+CKTOT+MB+TROPI)
CK, MB: 3.9 ng/mL (ref 0.3–4.0)
Total CK: 1425 U/L — ABNORMAL HIGH (ref 7–177)
Total CK: 1602 U/L — ABNORMAL HIGH (ref 7–177)
Total CK: 1871 U/L — ABNORMAL HIGH (ref 7–177)
Troponin I: <0.01 ng/mL (ref 0.00–0.06)

## 2011-03-15 LAB — DIFFERENTIAL
Lymphs Abs: 2.1 10*3/uL (ref 0.7–4.0)
Monocytes Absolute: 0.9 10*3/uL (ref 0.1–1.0)
Monocytes Relative: 7 % (ref 3–12)
Neutro Abs: 10.8 10*3/uL — ABNORMAL HIGH (ref 1.7–7.7)
Neutrophils Relative %: 78 % — ABNORMAL HIGH (ref 43–77)

## 2011-03-15 LAB — PROTIME-INR: INR: 0.9 (ref 0.00–1.49)

## 2011-03-15 LAB — TSH: TSH: 4.459 u[IU]/mL (ref 0.350–4.500)

## 2011-03-15 LAB — URINE CULTURE
Colony Count: NO GROWTH
Culture: NO GROWTH

## 2011-03-15 LAB — TYPE AND SCREEN: Antibody Screen: NEGATIVE

## 2011-03-15 LAB — MAGNESIUM: Magnesium: 2.1 mg/dL (ref 1.5–2.5)

## 2011-03-27 ENCOUNTER — Telehealth: Payer: Self-pay | Admitting: Internal Medicine

## 2011-03-27 NOTE — Telephone Encounter (Signed)
Diltiazem 90 day with [facility]ills [facility]

## 2011-03-27 NOTE — Telephone Encounter (Signed)
Pt id number for prescription [id number]

## 2011-03-29 ENCOUNTER — Other Ambulatory Visit: Payer: Self-pay | Admitting: *Deleted

## 2011-03-29 MED ORDER — DILTIAZEM HCL ER COATED BEADS 240 MG PO CP24: 240.0000 mg | ORAL_CAPSULE | Freq: Every day | ORAL | Status: DC

## 2011-03-29 NOTE — Telephone Encounter (Signed)
This needs to be printed and signed by the Dr to be faxed in please

## 2011-03-29 NOTE — Telephone Encounter (Signed)
Faxed to pharmacy [facility]

## 2011-04-17 NOTE — Consult Note (Signed)
Abigail Sullivan, Abigail Sullivan                ACCOUNT NO.:  [id number]   MEDICAL RECORD NO.:  [id number]          PATIENT TYPE:  INP   LOCATION:  3108                         FACILITY:  [facility]   PHYSICIAN:  Jonelle Sidle, MD DATE OF BIRTH:  1946/07/18   DATE OF CONSULTATION:  DATE OF DISCHARGE:                                 CONSULTATION   REQUESTING PHYSICIAN:  Dr. Tressie Stalker   REASON FOR CONSULTATION:  Atrial fibrillation with rapid ventricular  response.   HISTORY OF PRESENT ILLNESS:  Abigail Sullivan is a pleasant 65 year old woman  with limited information indicating approximately 12-year history of  hypertension, hyperlipidemia, and autoimmune hepatitis.  She lives in  [place], although has all of her specialty follow-up here in [place].  She denies any personal history of coronary artery disease or myocardial  infarction, but does report a history of previous irregular heart  beats and was seen by a cardiologist in [place], [place] approximately 7  years ago for this problem.  She states that she underwent cardiac  stress testing which was normal at that time and does not recall any  specific diagnosis of atrial fibrillation, although does admit that  occasionally she does experience rapid palpitations at rest, lasting  approximately 30 seconds, over the last several years.  She is now  status post anterolateral decompression L2-3 and L3-4 levels with  diskectomy and plating under general anesthesia on the 4th of March by  Dr. Venetia Maxon.  She was noted to have some postoperative agitation and by  telemetry monitoring has had evidence of paroxysmal atrial fibrillation  with rapid ventricular response as high as the 160s.  During my  interview, she had returned to sinus rhythm around 100 beats per minute.  Her electrocardiogram over the last few days shows evidence of atrial  fibrillation with nonspecific ST-T wave changes.  Her tracing from January 31, 2009 showed sinus rhythm with  premature atrial complexes.  She did  have cardiac markers obtained over the last 24 hours, revealing normal  troponin I levels.  Her TSH is also normal at 4.4.  She is mildly anemic  with a hemoglobin of 11.9 and otherwise her electrolytes are normal.  We  have been asked to help assist with her management.   ALLERGIES:  NO KNOWN DRUG ALLERGIES.   MEDICATIONS AT HOME:  Include:  1. Estradiol daily.  2. Lisinopril 10 mg p.o. daily.  3. Ursodiol 300 mg p.o. b.i.d.  4. Valtrex 1000 mg p.o. daily.  5. Acid reducer 150 mg p.o. daily.  6. Aspirin 81 mg p.o. daily.  7. Multivitamin one p.o. daily.  8. Calcium and vitamin D 600 mg p.o. daily.  9. Actonel 150 mg monthly.   PAST MEDICAL HISTORY:  Is outlined above.  1. She reports history of hypertension and hyperlipidemia, although no      known thyroid disease, diabetes mellitus, congestive heart failure      or cerebrovascular disease/stroke.  2. She reports a history of autoimmune hepatitis/primary biliary      cirrhosis overlap and is followed by Dr. Madilyn Fireman here in [place].  3.  She has had previous back surgeries and history of scoliosis      spondylosis and spondylolisthesis with herniated lumbar disks.   SOCIAL HISTORY:  The patient is married.  She lives in [place], [place].  No active tobacco or alcohol use.   FAMILY HISTORY:  Reviewed and is noncontributory at this point.   REVIEW OF SYSTEMS:  As outlined above.  She has no typical exertional  chest pain or breathlessness at baseline.  She denies any prolonged  episodes of palpitations but, as stated above, has had brief episodes of  rapid palpitations at rest over the last several years.  She has never  been on Coumadin.  She has no orthopnea, PND, or lower extremity edema.  Otherwise negative.   EXAMINATION:  Blood pressure of 114/88, heart rate 100 and sinus rhythm  up into the 150s-160s with atrial fibrillation, temperature 98.1  degrees, respirations 21  and nonlabored with oxygen saturation 90% on  room air.  The patient is comfortable and in no acute distress.  HEENT:  Conjunctivae is normal.  Pharynx is clear.  NECK:  Supple.  No elevated venous pressure.  No loud bruits or  thyromegaly.  LUNGS:  Clear breathing at rest.  CARDIAC:  Exam reveals a regular rate and rhythm.  Soft S4.  No  pathologic systolic murmur or pericardial rub.  ABDOMEN:  Soft, nontender.  Active bowel sounds.  EXTREMITIES:  Exhibit trace edema, symmetrical, distal pulses 1+.  SKIN:  Warm and dry.  MUSCULOSKELETAL:  No kyphosis noted.  NEUROPSYCHIATRIC:  The patient is alert and oriented x3.  Affect is  appropriate.   LABORATORY DATA:  WBC 11.4, hemoglobin 11.9, hematocrit 33.7, platelets  212.  Sodium 137, potassium 3.5, chloride 108, bicarb 21, glucose 90,  BUN 80, creatinine 0.75, total CK decreasing from 18.71 down to 14.25  with normal CK-MB and CK-MB relative indices and also normal troponin I  levels x3.  TSH is normal at 4.459.  Urinalysis with large amount of  blood but otherwise negative and urine culture as well as blood cultures  are negative at this point.   Chest x-ray from earlier today demonstrates no acute disease process.   IMPRESSION:  1. Paroxysmal atrial fibrillation with rapid ventricular response.  I      suspect that the patient has a longstanding history of this based      on her description of palpitations intermittently, perhaps now      exacerbated in the postoperative setting.  Her cardiac markers      argue against an acute coronary syndrome and her electrocardiogram      is nonspecific.  She is aware of the palpitations, but is not      having any other chest pain or shortness of breath.  She has not      been on any rate controlling medications as of yet.  Her CHADS2      score is essentially 1 at this point based on hypertension.  2. Longstanding history of hypertension.  3. Normal TSH.  4. Reported history of  hyperlipidemia.  5. Reported history of autoimmune hepatitis followed by Dr. Madilyn Fireman.  6. Status post anterolateral decompression L2-L3 and L3-L4 with      diskectomy and plating under general anesthesia on February 03, 2009.   RECOMMENDATIONS:  At this point, would continue aspirin and not plan on  Coumadin.  Would initiate Diltiazem at 60 mg p.o. q.6 h following a  single 15  mg IV bolus.  This can be titrated as needed.  Would  concurrently decrease lisinopril to 5 mg daily.  A follow-up  electrocardiogram as well as echocardiogram will be obtained.  As long  as her rhythm stabilizes on this regimen, she should be able to be  transferred to Telemetry.  Would not otherwise anticoagulate at this  time.  Our service will follow with you.      Jonelle Sidle, MD  Electronically Signed     SGM/MEDQ  D:  02/06/2009  T:  02/06/2009  Job:  [id number]   cc:   Cristi Loron, M.D.

## 2011-04-17 NOTE — Op Note (Signed)
NAMETOSCA, PLETZ                ACCOUNT NO.:  [id number]   MEDICAL RECORD NO.:  [id number]          PATIENT TYPE:  INP   LOCATION:  3108                         FACILITY:  [facility]   PHYSICIAN:  Danae Orleans. Venetia Maxon, M.D.  DATE OF BIRTH:  [birth year]   DATE OF PROCEDURE:  02/03/2009  DATE OF DISCHARGE:                               OPERATIVE REPORT   PREOPERATIVE DIAGNOSIS:  Scoliosis, spondylosis, spondylolisthesis,  herniated lumbar disk, degenerative disk disease and radiculopathy L2-3  and L3-4 levels.   POSTOPERATIVE DIAGNOSIS:  Scoliosis, spondylosis, spondylolisthesis,  herniated lumbar disk, degenerative disk disease and radiculopathy L2-3  and L3-4 levels.   PROCEDURE:  Anterolateral decompression L2-3 and L3-4 levels with  diskectomy, interbody PEEK cage fusion with Osteocel and anterolateral  plating at L2-3 and L3-4 levels with neuro monitoring.   SURGEON:  Danae Orleans. Venetia Maxon, MD   ASSISTANTS:  Georgiann Cocker, RN and Hilda Lias, MD   ANESTHESIA:  General endotracheal anesthesia.   ESTIMATED BLOOD LOSS:  Minimal.   COMPLICATIONS:  None.   DISPOSITION:  To recovery.   INDICATIONS:  Clydean Posas is a 65 year old woman with severe lumbar  scoliosis and disk herniations at L2-3 and L3-4 levels on the left.  It  was elected to take her to surgery for anterolateral decompression and  fusion at the L2-3 and L3-4 levels.   PROCEDURE:  Ms. Hulsebus was brought to the operating room.  Following a  satisfactory and uncomplicated induction of general endotracheal  anesthesia plus intravenous lines and Foley catheter, she was placed in  a lateral position with right side down and the operating table was  adjusted and the patient was taped so that exposure could be obtained of  the L3-4 and L2-3 interspaces.  Using the [facility] far lateral  decompression minimally invasive system with neuro monitoring the lower  extremity myotomes, the C-arm fluoroscopy to confirm the correct level  and orientation the incision was made on the left flank overlying the L3-  4 interspace and further incision was made posteriorly, one  fingerbreadth behind and the retroperitoneal space was entered.  The  soft tissues were swept and then the dilator probe was inserted after a  stab incision was made through fascia.  Using sequential dilators and  confirming with neuro monitoring, the interspace was instrumented with a  K-wire at the L3-4 level and subsequently there was no evidence of any  neural irritation.  A minimally invasive retractor was placed.  The  psoas muscle was swept off the interspace after the posterior shim was  placed, no evidence of any neural activity.  The interspace was incised  and a thorough diskectomy was performed including release of the lateral  annulus on the right side.  After thorough diskectomy, an initial cage  phantom was placed.  An 8-mm PEEK cage was selected, packed with  Osteocel, inserted and countersunk appropriately with good restoration  of the interspace height and alignment with correction of the scoliotic  curve and deformity.  A lateral plate was then placed with 5.5 x 55 mm  screws one at L3, one at  L4 and the plate was placed and anchored  appropriately.  A separate stab incision was then made between the tenth  and eleventh ribs overlying the L2-3 interspace and the L2-3 interspace  was entered and K-wire was placed.  The similar diskectomy was performed  and after lateral release was performed with thorough diskectomy, a 50-  mm x 8-mm PEEK cage was selected, packed and morselized with Osteocel  and inserted into the interspace and countersunk appropriately.  The  lateral osteophytes were removed at this level.  A similarly sized  lateral plate was placed with 50 mm screws and locked down  appropriately.  The minimally invasive retractor was removed.  The  incisions were closed with interrupted Vicryls after irrigation.  The  wound was   dressed with Dermabond.  The patient was extubated in the  operating room and taken to recovery room in stable satisfactory  condition having tolerated the operation well.  Counts were correct at  the end of the case.      Danae Orleans. Venetia Maxon, M.D.  Electronically Signed     JDS/MEDQ  D:  02/03/2009  T:  02/04/2009  Job:  [id number]

## 2011-04-20 NOTE — Discharge Summary (Signed)
[name], Sullivan                ACCOUNT NO.:  [id number]   MEDICAL RECORD NO.:  [id number]          PATIENT TYPE:  INP   LOCATION:  4709                         FACILITY:  [facility]   PHYSICIAN:  Danae Orleans. Venetia Maxon, M.D.  DATE OF BIRTH:  [birth year]   DATE OF ADMISSION:  02/03/2009  DATE OF DISCHARGE:  02/07/2009                               DISCHARGE SUMMARY   REASON FOR ADMISSION:  1. Lumbosacral spondylosis.  2. Spondylolisthesis.  3. Scoliosis.  4. Hyperlipidemia.  5. Autoimmune hepatitis.  6. Esophageal reflux.   ADDITIONAL DIAGNOSES:  1. Acute delirium.  2. Bone cartilage disorder, not otherwise specified.  3. Atrial fibrillation.   FINAL DIAGNOSES:  1. Acute delirium.  2. Bone cartilage disorder, not otherwise specified.  3. Atrial fibrillation.   HISTORY OF PRESENT ILLNESS AND HOSPITAL COURSE:  Abigail Sullivan is a 65-  year-old woman with idiopathic scoliosis, severe left groin and low back  pain.  She has a significant lumbar scoliosis that involving the L2-3  and L3-L4 levels and it was elected to admit to the hospital, at which  point she underwent an anterolateral decompression and fusion L2-3 and  L3-4 levels with good restoration of vertebral alignment and relief of  her pain.  Postoperatively, she had a bout of atrial fibrillation.  She  also had acute delirium.  She was initially mildly combative in the  intensive care unit.  She was treated for her new onset atrial  fibrillation with Cardizem drip.  She also had hypokalemia and  hypomagnesemia, both which were replaced, and she was having acute  delirium, which also resolved.  The patient was observed in the  intensive care unit and improved her atrial fibrillation also resolved.  Her echocardiogram was not problematic, and at this point, she was doing  better on March 10, 2009, and was released home following cardiology  clearance with discharge medications of Flexeril and Tylenol for pain.  Follow up in 3 weeks  in the office.      Danae Orleans. Venetia Maxon, M.D.  Electronically Signed     JDS/MEDQ  D:  03/10/2009  T:  03/11/2009  Job:  [id number]

## 2011-10-10 ENCOUNTER — Encounter: Payer: Self-pay | Admitting: *Deleted

## 2011-10-10 ENCOUNTER — Ambulatory Visit (INDEPENDENT_AMBULATORY_CARE_PROVIDER_SITE_OTHER): Payer: Medicare Other | Admitting: Internal Medicine

## 2011-10-10 ENCOUNTER — Encounter: Payer: Self-pay | Admitting: Internal Medicine

## 2011-10-10 VITALS — BP 150/83 | HR 104 | Ht 64.0 in | Wt 140.0 lb

## 2011-10-10 DIAGNOSIS — I471 Supraventricular tachycardia: Secondary | ICD-10-CM

## 2011-10-10 DIAGNOSIS — I498 Other specified cardiac arrhythmias: Secondary | ICD-10-CM

## 2011-10-10 DIAGNOSIS — Z720 Tobacco use: Secondary | ICD-10-CM | POA: Insufficient documentation

## 2011-10-10 DIAGNOSIS — R0789 Other chest pain: Secondary | ICD-10-CM

## 2011-10-10 DIAGNOSIS — F17200 Nicotine dependence, unspecified, uncomplicated: Secondary | ICD-10-CM | POA: Insufficient documentation

## 2011-10-10 DIAGNOSIS — E785 Hyperlipidemia, unspecified: Secondary | ICD-10-CM

## 2011-10-10 DIAGNOSIS — I10 Essential (primary) hypertension: Secondary | ICD-10-CM

## 2011-10-10 NOTE — Progress Notes (Signed)
The patient presents today for routine electrophysiology followup.  Since last being seen in our clinic, the patient reports doing very well.  Today, she denies symptoms of palpitations, chest pain, shortness of breath, orthopnea, PND, lower extremity edema, dizziness, presyncope, syncope, or neurologic sequela.  The patient feels that she is tolerating medications without difficulties and is otherwise without complaint today.   Past Medical History  Diagnosis Date  . Hyperlipidemia   . Hypertension   . Hepatitis   . Atrial fibrillation     post-operative   . Premature atrial contractions     systomatic PAC's and documented SVT ( likley atrial tachycardia )   Past Surgical History  Procedure Date  . Lumbar laminectomy/decompression microdiscectomy 02/04/2011    L2-L3 and L3-L4 with diskectomy and plating    Current Outpatient Prescriptions  Medication Sig Dispense Refill  . acyclovir (ZOVIRAX) 200 MG capsule Take by mouth 2 (two) times daily.        Marland Kitchen aspirin 325 MG tablet Take 325 mg by mouth daily.        . Calcium Carbonate-Vitamin D (CALCIUM + D) 600-200 MG-UNIT TABS Take 1 tablet by mouth daily.        Marland Kitchen diltiazem (CARDIZEM CD) 240 MG 24 hr capsule Take 1 capsule (240 mg total) by mouth daily.  90 capsule  3  . Doxylamine Succinate, Sleep, (SLEEP AID PO) Take 50 mg by mouth as needed.        Marland Kitchen estrogen, conjugated,-medroxyprogesterone (PREMPRO) 0.625-2.5 MG per tablet Take 1 tablet by mouth daily.        Marland Kitchen lisinopril (PRINIVIL,ZESTRIL) 10 MG tablet Take 10 mg by mouth daily.        . multivitamin (THERAGRAN) per tablet Take 1 tablet by mouth daily.        . Omega-3 Fatty Acids (FISH OIL PO) Take 3 capsules by mouth daily.        . ranitidine (ZANTAC) 150 MG capsule Take 150 mg by mouth daily.        . simvastatin (ZOCOR) 20 MG tablet Take 20 mg by mouth at bedtime.        . ursodiol (ACTIGALL) 300 MG capsule Take 300 mg by mouth 2 (two) times daily.          No Known  Allergies  History   Social History  . Marital Status: Married    Spouse Name: N/A    Number of Children: N/A  . Years of Education: N/A   Occupational History  . Not on file.   Social History Main Topics  . Smoking status: Current Everyday Smoker -- 0.3 packs/day    Types: Cigarettes  . Smokeless tobacco: Never Used   Comment: smokes 3 cigarettes per day, not interested in quitting  . Alcohol Use: No  . Drug Use: No  . Sexually Active: Not on file   Other Topics Concern  . Not on file   Social History Narrative  . No narrative on file    Physical Exam: Filed Vitals:   10/10/11 1037  BP: 150/83  Pulse: 104  Height: 5\' 4"  (1.626 m)  Weight: 140 lb (63.504 kg)    GEN- The patient is anxious appearing, alert and oriented x 3 today.   Head- normocephalic, atraumatic Eyes-  Sclera clear, conjunctiva pink Ears- hearing intact Oropharynx- clear Neck- supple, no JVP Lymph- no cervical lymphadenopathy Lungs- Clear to ausculation bilaterally, normal work of breathing Heart- Regular rate and rhythm, no murmurs, rubs or  gallops, PMI not laterally displaced GI- soft, NT, ND, + BS Extremities- no clubbing, cyanosis, or edema MS- no significant deformity or atrophy Skin- no rash or lesion  ekg today reveals sinus rhythm 97 bpm, otherwise normal ekg  Assessment and Plan:

## 2011-10-10 NOTE — Patient Instructions (Signed)
Your physician wants you to follow-up in: 12 months with Dr Allred You will receive a reminder letter in the mail two months in advance. If you don't receive a letter, please call our office to schedule the follow-up appointment.  

## 2011-10-10 NOTE — Assessment & Plan Note (Signed)
Resolved Low risk myoview from 2011 reviewed with pt today No further workup planned

## 2011-10-10 NOTE — Assessment & Plan Note (Signed)
Elevated today,  I have recommended tobacco cessation though she is unwilling to quit. She will follow her BP at home and contact my office if it remains elevated We would increase lisinopril at that time.

## 2011-10-10 NOTE — Assessment & Plan Note (Signed)
Stable No change required today  

## 2011-10-10 NOTE — Assessment & Plan Note (Signed)
I have recommended tobacco cessation though she is unwilling to quit.

## 2011-10-10 NOTE — Assessment & Plan Note (Signed)
No recent epiosdes Controlled with diltiazem

## 2011-12-10 DIAGNOSIS — K759 Inflammatory liver disease, unspecified: Secondary | ICD-10-CM | POA: Diagnosis not present

## 2012-02-13 DIAGNOSIS — I10 Essential (primary) hypertension: Secondary | ICD-10-CM | POA: Diagnosis not present

## 2012-02-13 DIAGNOSIS — E782 Mixed hyperlipidemia: Secondary | ICD-10-CM | POA: Diagnosis not present

## 2012-02-13 DIAGNOSIS — M25869 Other specified joint disorders, unspecified knee: Secondary | ICD-10-CM | POA: Diagnosis not present

## 2012-03-20 DIAGNOSIS — Z0000 Encounter for general adult medical examination without abnormal findings: Secondary | ICD-10-CM | POA: Diagnosis not present

## 2012-04-02 DIAGNOSIS — H01009 Unspecified blepharitis unspecified eye, unspecified eyelid: Secondary | ICD-10-CM | POA: Diagnosis not present

## 2012-04-02 DIAGNOSIS — D1801 Hemangioma of skin and subcutaneous tissue: Secondary | ICD-10-CM | POA: Diagnosis not present

## 2012-04-02 DIAGNOSIS — B009 Herpesviral infection, unspecified: Secondary | ICD-10-CM | POA: Diagnosis not present

## 2012-04-02 DIAGNOSIS — L821 Other seborrheic keratosis: Secondary | ICD-10-CM | POA: Diagnosis not present

## 2012-04-07 DIAGNOSIS — F3289 Other specified depressive episodes: Secondary | ICD-10-CM | POA: Diagnosis not present

## 2012-04-07 DIAGNOSIS — F329 Major depressive disorder, single episode, unspecified: Secondary | ICD-10-CM | POA: Diagnosis not present

## 2012-04-29 DIAGNOSIS — Z01419 Encounter for gynecological examination (general) (routine) without abnormal findings: Secondary | ICD-10-CM | POA: Diagnosis not present

## 2012-04-29 DIAGNOSIS — Z1231 Encounter for screening mammogram for malignant neoplasm of breast: Secondary | ICD-10-CM | POA: Diagnosis not present

## 2012-04-29 DIAGNOSIS — R61 Generalized hyperhidrosis: Secondary | ICD-10-CM | POA: Diagnosis not present

## 2012-04-30 DIAGNOSIS — M1610 Unilateral primary osteoarthritis, unspecified hip: Secondary | ICD-10-CM | POA: Diagnosis not present

## 2012-04-30 DIAGNOSIS — M76899 Other specified enthesopathies of unspecified lower limb, excluding foot: Secondary | ICD-10-CM | POA: Diagnosis not present

## 2012-05-05 DIAGNOSIS — R928 Other abnormal and inconclusive findings on diagnostic imaging of breast: Secondary | ICD-10-CM | POA: Diagnosis not present

## 2012-05-05 DIAGNOSIS — Z13820 Encounter for screening for osteoporosis: Secondary | ICD-10-CM | POA: Diagnosis not present

## 2012-05-07 ENCOUNTER — Other Ambulatory Visit: Payer: Self-pay | Admitting: Internal Medicine

## 2012-05-07 MED ORDER — DILTIAZEM HCL ER COATED BEADS 240 MG PO CP24: 240.0000 mg | ORAL_CAPSULE | Freq: Every day | ORAL | Status: DC

## 2012-05-07 NOTE — Telephone Encounter (Signed)
Pt needs a 90day supply sent in plz she is almost out

## 2012-05-28 DIAGNOSIS — E782 Mixed hyperlipidemia: Secondary | ICD-10-CM | POA: Diagnosis not present

## 2012-05-28 DIAGNOSIS — I10 Essential (primary) hypertension: Secondary | ICD-10-CM | POA: Diagnosis not present

## 2012-06-10 DIAGNOSIS — R61 Generalized hyperhidrosis: Secondary | ICD-10-CM | POA: Diagnosis not present

## 2012-06-10 DIAGNOSIS — Z79890 Hormone replacement therapy (postmenopausal): Secondary | ICD-10-CM | POA: Diagnosis not present

## 2012-06-10 DIAGNOSIS — Z13820 Encounter for screening for osteoporosis: Secondary | ICD-10-CM | POA: Diagnosis not present

## 2012-06-23 DIAGNOSIS — K759 Inflammatory liver disease, unspecified: Secondary | ICD-10-CM | POA: Diagnosis not present

## 2012-07-01 DIAGNOSIS — Z961 Presence of intraocular lens: Secondary | ICD-10-CM | POA: Diagnosis not present

## 2012-09-01 DIAGNOSIS — Z23 Encounter for immunization: Secondary | ICD-10-CM | POA: Diagnosis not present

## 2012-09-05 DIAGNOSIS — F17200 Nicotine dependence, unspecified, uncomplicated: Secondary | ICD-10-CM | POA: Diagnosis not present

## 2012-09-05 DIAGNOSIS — Z2089 Contact with and (suspected) exposure to other communicable diseases: Secondary | ICD-10-CM | POA: Diagnosis not present

## 2012-09-05 DIAGNOSIS — I10 Essential (primary) hypertension: Secondary | ICD-10-CM | POA: Diagnosis not present

## 2012-09-05 DIAGNOSIS — E782 Mixed hyperlipidemia: Secondary | ICD-10-CM | POA: Diagnosis not present

## 2012-10-27 ENCOUNTER — Ambulatory Visit (INDEPENDENT_AMBULATORY_CARE_PROVIDER_SITE_OTHER): Payer: Medicare Other | Admitting: Internal Medicine

## 2012-10-27 ENCOUNTER — Encounter: Payer: Self-pay | Admitting: Internal Medicine

## 2012-10-27 VITALS — BP 124/68 | HR 84 | Ht 65.0 in | Wt 125.0 lb

## 2012-10-27 DIAGNOSIS — I471 Supraventricular tachycardia: Secondary | ICD-10-CM

## 2012-10-27 DIAGNOSIS — E785 Hyperlipidemia, unspecified: Secondary | ICD-10-CM

## 2012-10-27 DIAGNOSIS — F17200 Nicotine dependence, unspecified, uncomplicated: Secondary | ICD-10-CM

## 2012-10-27 DIAGNOSIS — I10 Essential (primary) hypertension: Secondary | ICD-10-CM | POA: Diagnosis not present

## 2012-10-27 DIAGNOSIS — Z720 Tobacco use: Secondary | ICD-10-CM

## 2012-10-27 DIAGNOSIS — I498 Other specified cardiac arrhythmias: Secondary | ICD-10-CM | POA: Diagnosis not present

## 2012-10-27 MED ORDER — DILTIAZEM HCL ER COATED BEADS 240 MG PO CP24: 240.0000 mg | ORAL_CAPSULE | Freq: Every day | ORAL | Status: DC

## 2012-10-27 NOTE — Assessment & Plan Note (Signed)
Stable No change required today Followed by primary care

## 2012-10-27 NOTE — Assessment & Plan Note (Signed)
Stable No change required today  

## 2012-10-27 NOTE — Progress Notes (Signed)
PCP: Abner Greenspan, MD  Abigail Sullivan is a 66 y.o. female who presents today for routine electrophysiology followup.  Since last being seen in our clinic, the patient reports doing very well.  She has rare palpitations at night but feels that these are well controlled. Today, she denies symptoms of chest pain, shortness of breath,  lower extremity edema, dizziness, presyncope, or syncope.  The patient is otherwise without complaint today.   Past Medical History  Diagnosis Date  . Hyperlipidemia   . Hypertension   . Hepatitis   . Atrial fibrillation     post-operative   . Premature atrial contractions     systomatic PAC's and documented SVT ( likley atrial tachycardia )   Past Surgical History  Procedure Date  . Lumbar laminectomy/decompression microdiscectomy 02/04/2011    L2-L3 and L3-L4 with diskectomy and plating    Current Outpatient Prescriptions  Medication Sig Dispense Refill  . acyclovir (ZOVIRAX) 200 MG capsule Take by mouth 2 (two) times daily.        Marland Kitchen aspirin 325 MG tablet Take 325 mg by mouth daily.        . Calcium Carbonate-Vitamin D (CALCIUM + D) 600-200 MG-UNIT TABS Take 1 tablet by mouth daily.        Marland Kitchen diltiazem (CARDIZEM CD) 240 MG 24 hr capsule Take 1 capsule (240 mg total) by mouth daily.  90 capsule  3  . Doxylamine Succinate, Sleep, (SLEEP AID PO) Take 50 mg by mouth as needed.        Marland Kitchen lisinopril (PRINIVIL,ZESTRIL) 10 MG tablet Take 10 mg by mouth daily.        Marland Kitchen LYRICA 75 MG capsule Take 1 tablet by mouth daily.      . medroxyPROGESTERone (PROVERA) 2.5 MG tablet Take 1 tablet by mouth daily.      . multivitamin (THERAGRAN) per tablet Take 1 tablet by mouth daily.        . Omega-3 Fatty Acids (FISH OIL PO) Take 3 capsules by mouth daily.        . ranitidine (ZANTAC) 150 MG capsule Take 150 mg by mouth daily.        . simvastatin (ZOCOR) 20 MG tablet Take 20 mg by mouth at bedtime.        . ursodiol (ACTIGALL) 300 MG capsule Take 300 mg by mouth 2 (two) times  daily.          Physical Exam: Filed Vitals:   10/27/12 1026  BP: 124/68  Pulse: 84  Height: 5\' 5"  (1.651 m)  Weight: 125 lb (56.7 kg)  SpO2: 99%    GEN- The patient is well appearing, alert and oriented x 3 today.   Head- normocephalic, atraumatic Eyes-  Sclera clear, conjunctiva pink Ears- hearing intact Oropharynx- clear Lungs- Clear to ausculation bilaterally, normal work of breathing Heart- Regular rate and rhythm, no murmurs, rubs or gallops, PMI not laterally displaced GI- soft, NT, ND, + BS Extremities- no clubbing, cyanosis, or edema  ekg today reveals sinus rhythm with occasional PACs, otherwise normal ekg  Assessment and Plan:

## 2012-10-27 NOTE — Assessment & Plan Note (Signed)
Controlled. No changes. 

## 2012-10-27 NOTE — Patient Instructions (Addendum)
Your physician wants you to follow-up in: 12 months with Dr Allred You will receive a reminder letter in the mail two months in advance. If you don't receive a letter, please call our office to schedule the follow-up appointment.  

## 2012-10-27 NOTE — Assessment & Plan Note (Signed)
Cessation is advised 

## 2012-11-03 ENCOUNTER — Other Ambulatory Visit: Payer: Self-pay | Admitting: Internal Medicine

## 2012-11-03 NOTE — Telephone Encounter (Signed)
Pt states that the 24 hour diltiazem will cost $40 next year instead of the $7 the others will cost.  She is requesting that it be changed to diltiazem hcl or verapamil (regular short acting) if possible.  She would like the rx sent to [facility].  She is requesting a return call when this has been done.

## 2012-11-03 NOTE — Telephone Encounter (Signed)
New Problem:    Patient called in because her insurance company let her know that her diltiazem (CARDIZEM CD) 240 MG 24 hr capsule will be more expensive next year and wanted to know if she could change it to Diltiazem HCL or Verapamil HCL.  Please call back.

## 2012-11-04 NOTE — Telephone Encounter (Signed)
We can have her take cardizem 60mg  q 6 hours, though I worry about compliance and lifestyle implications with this strategy. Are there any once daily calcium channel blockers that they will pay for?  How about daily beta blockers?

## 2012-11-05 NOTE — Telephone Encounter (Signed)
There are BB I'm sure that would be covered but not sure about the CCB.  Just let me know if your would prefer BB and we can try this

## 2012-11-07 DIAGNOSIS — R928 Other abnormal and inconclusive findings on diagnostic imaging of breast: Secondary | ICD-10-CM | POA: Diagnosis not present

## 2012-11-07 NOTE — Telephone Encounter (Signed)
Dr Johney Frame says patient can change to Nadolol 10mg  daily if insurance will pay  She is going to check and see if her insurance will cover and call me back on Mon

## 2012-11-07 NOTE — Telephone Encounter (Signed)
Pt calling back re change in medication, check with pharmacy and med has not been called in , pls call [phone]

## 2012-11-10 NOTE — Telephone Encounter (Signed)
F/u   Returning call back to nurse from Friday evening.  Will explain to Bonnie when she calls

## 2012-11-11 NOTE — Telephone Encounter (Signed)
Nadolol will not work It will be over $100  So she will stay with Cardizem

## 2012-12-18 DIAGNOSIS — K759 Inflammatory liver disease, unspecified: Secondary | ICD-10-CM | POA: Diagnosis not present

## 2013-01-06 DIAGNOSIS — E782 Mixed hyperlipidemia: Secondary | ICD-10-CM | POA: Diagnosis not present

## 2013-01-06 DIAGNOSIS — I4891 Unspecified atrial fibrillation: Secondary | ICD-10-CM | POA: Diagnosis not present

## 2013-01-06 DIAGNOSIS — I10 Essential (primary) hypertension: Secondary | ICD-10-CM | POA: Diagnosis not present

## 2013-04-02 DIAGNOSIS — L538 Other specified erythematous conditions: Secondary | ICD-10-CM | POA: Diagnosis not present

## 2013-04-02 DIAGNOSIS — B009 Herpesviral infection, unspecified: Secondary | ICD-10-CM | POA: Diagnosis not present

## 2013-04-02 DIAGNOSIS — L259 Unspecified contact dermatitis, unspecified cause: Secondary | ICD-10-CM | POA: Diagnosis not present

## 2013-04-02 DIAGNOSIS — L250 Unspecified contact dermatitis due to cosmetics: Secondary | ICD-10-CM | POA: Diagnosis not present

## 2013-04-02 DIAGNOSIS — L570 Actinic keratosis: Secondary | ICD-10-CM | POA: Diagnosis not present

## 2013-05-01 DIAGNOSIS — Z79890 Hormone replacement therapy (postmenopausal): Secondary | ICD-10-CM | POA: Diagnosis not present

## 2013-05-01 DIAGNOSIS — Z01419 Encounter for gynecological examination (general) (routine) without abnormal findings: Secondary | ICD-10-CM | POA: Diagnosis not present

## 2013-05-04 DIAGNOSIS — R928 Other abnormal and inconclusive findings on diagnostic imaging of breast: Secondary | ICD-10-CM | POA: Diagnosis not present

## 2013-05-12 DIAGNOSIS — R7402 Elevation of levels of lactic acid dehydrogenase (LDH): Secondary | ICD-10-CM | POA: Diagnosis not present

## 2013-05-12 DIAGNOSIS — I10 Essential (primary) hypertension: Secondary | ICD-10-CM | POA: Diagnosis not present

## 2013-05-12 DIAGNOSIS — E782 Mixed hyperlipidemia: Secondary | ICD-10-CM | POA: Diagnosis not present

## 2013-06-08 DIAGNOSIS — M79609 Pain in unspecified limb: Secondary | ICD-10-CM | POA: Diagnosis not present

## 2013-06-08 DIAGNOSIS — Z1331 Encounter for screening for depression: Secondary | ICD-10-CM | POA: Diagnosis not present

## 2013-06-09 DIAGNOSIS — M79609 Pain in unspecified limb: Secondary | ICD-10-CM | POA: Diagnosis not present

## 2013-06-16 DIAGNOSIS — K759 Inflammatory liver disease, unspecified: Secondary | ICD-10-CM | POA: Diagnosis not present

## 2013-06-16 DIAGNOSIS — D500 Iron deficiency anemia secondary to blood loss (chronic): Secondary | ICD-10-CM | POA: Diagnosis not present

## 2013-06-29 DIAGNOSIS — M205X9 Other deformities of toe(s) (acquired), unspecified foot: Secondary | ICD-10-CM | POA: Diagnosis not present

## 2013-06-29 DIAGNOSIS — G5790 Unspecified mononeuropathy of unspecified lower limb: Secondary | ICD-10-CM | POA: Diagnosis not present

## 2013-06-29 DIAGNOSIS — IMO0002 Reserved for concepts with insufficient information to code with codable children: Secondary | ICD-10-CM | POA: Diagnosis not present

## 2013-07-02 DIAGNOSIS — H26499 Other secondary cataract, unspecified eye: Secondary | ICD-10-CM | POA: Diagnosis not present

## 2013-09-08 DIAGNOSIS — Z23 Encounter for immunization: Secondary | ICD-10-CM | POA: Diagnosis not present

## 2013-09-11 DIAGNOSIS — E782 Mixed hyperlipidemia: Secondary | ICD-10-CM | POA: Diagnosis not present

## 2013-09-11 DIAGNOSIS — I10 Essential (primary) hypertension: Secondary | ICD-10-CM | POA: Diagnosis not present

## 2013-09-14 DIAGNOSIS — E875 Hyperkalemia: Secondary | ICD-10-CM | POA: Diagnosis not present

## 2013-09-30 DIAGNOSIS — I129 Hypertensive chronic kidney disease with stage 1 through stage 4 chronic kidney disease, or unspecified chronic kidney disease: Secondary | ICD-10-CM | POA: Diagnosis not present

## 2013-09-30 DIAGNOSIS — N183 Chronic kidney disease, stage 3 unspecified: Secondary | ICD-10-CM | POA: Diagnosis not present

## 2013-09-30 DIAGNOSIS — E782 Mixed hyperlipidemia: Secondary | ICD-10-CM | POA: Diagnosis not present

## 2013-10-28 ENCOUNTER — Encounter: Payer: Self-pay | Admitting: Internal Medicine

## 2013-10-28 ENCOUNTER — Ambulatory Visit (INDEPENDENT_AMBULATORY_CARE_PROVIDER_SITE_OTHER): Payer: Medicare Other | Admitting: Internal Medicine

## 2013-10-28 VITALS — BP 102/68 | HR 97 | Ht 65.0 in | Wt 123.0 lb

## 2013-10-28 DIAGNOSIS — F17200 Nicotine dependence, unspecified, uncomplicated: Secondary | ICD-10-CM

## 2013-10-28 DIAGNOSIS — I4891 Unspecified atrial fibrillation: Secondary | ICD-10-CM

## 2013-10-28 DIAGNOSIS — I498 Other specified cardiac arrhythmias: Secondary | ICD-10-CM

## 2013-10-28 DIAGNOSIS — Z720 Tobacco use: Secondary | ICD-10-CM

## 2013-10-28 DIAGNOSIS — I10 Essential (primary) hypertension: Secondary | ICD-10-CM

## 2013-10-28 MED ORDER — DILTIAZEM HCL ER COATED BEADS 240 MG PO CP24: 240.0000 mg | ORAL_CAPSULE | Freq: Every day | ORAL | Status: DC

## 2013-10-28 MED ORDER — ASPIRIN EC 81 MG PO TBEC: 81.0000 mg | DELAYED_RELEASE_TABLET | Freq: Every day | ORAL | Status: DC

## 2013-10-28 NOTE — Progress Notes (Signed)
PCP: Abner Greenspan, MD  Abigail Sullivan is a 67 y.o. female who presents today for routine electrophysiology followup.  Since last being seen in our clinic, the patient reports doing very well.  She has rare palpitations at night but feels that these are well controlled. Today, she denies symptoms of chest pain, shortness of breath,  lower extremity edema, dizziness, presyncope, or syncope.  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   The patient is otherwise without complaint today.   Past Medical History  Diagnosis Date  . Hyperlipidemia   . Hypertension   . Hepatitis   . Atrial fibrillation     post-operative   . Premature atrial contractions     systomatic PAC's and documented SVT ( likley atrial tachycardia )   Past Surgical History  Procedure Laterality Date  . Lumbar laminectomy/decompression microdiscectomy  02/04/2011    L2-L3 and L3-L4 with diskectomy and plating    Current Outpatient Prescriptions  Medication Sig Dispense Refill  . acyclovir (ZOVIRAX) 200 MG capsule Take by mouth 2 (two) times daily.        Marland Kitchen aspirin 325 MG tablet Take 325 mg by mouth daily.        . Calcium Carbonate-Vitamin D (CALCIUM + D) 600-200 MG-UNIT TABS Take 1 tablet by mouth daily.        Marland Kitchen diltiazem (CARDIZEM CD) 240 MG 24 hr capsule Take 1 capsule (240 mg total) by mouth daily.  90 capsule  3  . Doxylamine Succinate, Sleep, (SLEEP AID PO) Take 50 mg by mouth as needed.        Marland Kitchen estradiol (ESTRACE) 1 MG tablet TAKE ONE TABLET DAILY      . lisinopril (PRINIVIL,ZESTRIL) 10 MG tablet Take 10 mg by mouth daily.        . multivitamin (THERAGRAN) per tablet Take 1 tablet by mouth daily.        . Omega-3 Fatty Acids (FISH OIL PO) Take 3 capsules by mouth daily.        . simvastatin (ZOCOR) 20 MG tablet Take 20 mg by mouth at bedtime.        . ursodiol (ACTIGALL) 300 MG capsule Take 300 mg by mouth 2 (two) times daily.         No current facility-administered medications for this visit.     Physical Exam: Filed Vitals:   10/28/13 0945  BP: 102/68  Pulse: 97  Height: 5\' 5"  (1.651 m)  Weight: 123 lb (55.792 kg)  SpO2: 98%    GEN- The patient is well appearing, alert and oriented x 3 today.   Head- normocephalic, atraumatic Eyes-  Sclera clear, conjunctiva pink Ears- hearing intact Oropharynx- clear Lungs- Clear to ausculation bilaterally, normal work of breathing Heart- Regular rate and rhythm, no murmurs, rubs or gallops, PMI not laterally displaced GI- soft, NT, ND, + BS Extremities- no clubbing, cyanosis, or edema  ekg today reveals sinus rhythm with occasional PACs, otherwise normal ekg  Assessment and Plan:  1. afib Well controlled CHADS2VASC score is 3.  She is very clear that she does not want to take anticoagulation at this time.  She is aware that her annual risk for stroke is 2-3%. Decrease ASA to 81mg  daily today.  We discussed AVERROES data as an alterative  2. HTN Stable No change required today  3. Tobacco Cessation advised  Return in 1 year

## 2013-10-28 NOTE — Patient Instructions (Signed)
Your physician wants you to follow-up in: 12 months with Dr Allred You will receive a reminder letter in the mail two months in advance. If you don't receive a letter, please call our office to schedule the follow-up appointment.  Your physician has recommended you make the following change in your medication:  1) Decrease Aspirin to 81mg daily   

## 2013-12-09 DIAGNOSIS — E782 Mixed hyperlipidemia: Secondary | ICD-10-CM | POA: Diagnosis not present

## 2013-12-09 DIAGNOSIS — I129 Hypertensive chronic kidney disease with stage 1 through stage 4 chronic kidney disease, or unspecified chronic kidney disease: Secondary | ICD-10-CM | POA: Diagnosis not present

## 2013-12-09 DIAGNOSIS — R61 Generalized hyperhidrosis: Secondary | ICD-10-CM | POA: Diagnosis not present

## 2013-12-15 DIAGNOSIS — K759 Inflammatory liver disease, unspecified: Secondary | ICD-10-CM | POA: Diagnosis not present

## 2013-12-16 DIAGNOSIS — I129 Hypertensive chronic kidney disease with stage 1 through stage 4 chronic kidney disease, or unspecified chronic kidney disease: Secondary | ICD-10-CM | POA: Diagnosis not present

## 2013-12-16 DIAGNOSIS — B199 Unspecified viral hepatitis without hepatic coma: Secondary | ICD-10-CM | POA: Diagnosis not present

## 2013-12-16 DIAGNOSIS — E782 Mixed hyperlipidemia: Secondary | ICD-10-CM | POA: Diagnosis not present

## 2013-12-16 DIAGNOSIS — N183 Chronic kidney disease, stage 3 unspecified: Secondary | ICD-10-CM | POA: Diagnosis not present

## 2013-12-30 DIAGNOSIS — M5417 Radiculopathy, lumbosacral region: Secondary | ICD-10-CM | POA: Insufficient documentation

## 2013-12-30 DIAGNOSIS — IMO0002 Reserved for concepts with insufficient information to code with codable children: Secondary | ICD-10-CM | POA: Diagnosis not present

## 2013-12-30 DIAGNOSIS — M169 Osteoarthritis of hip, unspecified: Secondary | ICD-10-CM | POA: Diagnosis not present

## 2013-12-30 DIAGNOSIS — M4120 Other idiopathic scoliosis, site unspecified: Secondary | ICD-10-CM | POA: Diagnosis not present

## 2013-12-30 DIAGNOSIS — M1610 Unilateral primary osteoarthritis, unspecified hip: Secondary | ICD-10-CM | POA: Diagnosis not present

## 2014-01-05 ENCOUNTER — Other Ambulatory Visit: Payer: Self-pay | Admitting: Neurosurgery

## 2014-01-05 DIAGNOSIS — M25552 Pain in left hip: Secondary | ICD-10-CM

## 2014-01-05 DIAGNOSIS — M4120 Other idiopathic scoliosis, site unspecified: Secondary | ICD-10-CM

## 2014-01-14 ENCOUNTER — Ambulatory Visit
Admission: RE | Admit: 2014-01-14 | Discharge: 2014-01-14 | Disposition: A | Discharge status: Home / Self Care | Payer: Medicare Other | Source: Ambulatory Visit | Attending: Neurosurgery | Admitting: Neurosurgery

## 2014-01-14 DIAGNOSIS — M1610 Unilateral primary osteoarthritis, unspecified hip: Secondary | ICD-10-CM | POA: Diagnosis not present

## 2014-01-14 DIAGNOSIS — M48061 Spinal stenosis, lumbar region without neurogenic claudication: Secondary | ICD-10-CM | POA: Diagnosis not present

## 2014-01-14 DIAGNOSIS — M25552 Pain in left hip: Secondary | ICD-10-CM

## 2014-01-14 DIAGNOSIS — M5137 Other intervertebral disc degeneration, lumbosacral region: Secondary | ICD-10-CM | POA: Diagnosis not present

## 2014-01-14 DIAGNOSIS — M4120 Other idiopathic scoliosis, site unspecified: Secondary | ICD-10-CM

## 2014-01-14 DIAGNOSIS — M169 Osteoarthritis of hip, unspecified: Secondary | ICD-10-CM | POA: Diagnosis not present

## 2014-01-14 DIAGNOSIS — M47817 Spondylosis without myelopathy or radiculopathy, lumbosacral region: Secondary | ICD-10-CM | POA: Diagnosis not present

## 2014-01-14 MED ORDER — GADOBENATE DIMEGLUMINE 529 MG/ML IV SOLN
10.0000 mL | Freq: Once | INTRAVENOUS | Status: AC | PRN
  Administered 2014-01-14: 10 mL via INTRAVENOUS

## 2014-01-18 DIAGNOSIS — IMO0002 Reserved for concepts with insufficient information to code with codable children: Secondary | ICD-10-CM | POA: Diagnosis not present

## 2014-01-18 DIAGNOSIS — M545 Low back pain, unspecified: Secondary | ICD-10-CM | POA: Insufficient documentation

## 2014-01-18 DIAGNOSIS — I10 Essential (primary) hypertension: Secondary | ICD-10-CM | POA: Diagnosis not present

## 2014-01-18 DIAGNOSIS — M4120 Other idiopathic scoliosis, site unspecified: Secondary | ICD-10-CM | POA: Diagnosis not present

## 2014-01-18 DIAGNOSIS — M169 Osteoarthritis of hip, unspecified: Secondary | ICD-10-CM | POA: Diagnosis not present

## 2014-01-18 DIAGNOSIS — M1610 Unilateral primary osteoarthritis, unspecified hip: Secondary | ICD-10-CM | POA: Diagnosis not present

## 2014-01-21 DIAGNOSIS — M1610 Unilateral primary osteoarthritis, unspecified hip: Secondary | ICD-10-CM | POA: Diagnosis not present

## 2014-01-26 DIAGNOSIS — M1610 Unilateral primary osteoarthritis, unspecified hip: Secondary | ICD-10-CM | POA: Diagnosis not present

## 2014-02-01 DIAGNOSIS — IMO0002 Reserved for concepts with insufficient information to code with codable children: Secondary | ICD-10-CM | POA: Diagnosis not present

## 2014-02-01 DIAGNOSIS — M79609 Pain in unspecified limb: Secondary | ICD-10-CM | POA: Diagnosis not present

## 2014-02-01 DIAGNOSIS — I4891 Unspecified atrial fibrillation: Secondary | ICD-10-CM | POA: Diagnosis not present

## 2014-02-01 DIAGNOSIS — R52 Pain, unspecified: Secondary | ICD-10-CM | POA: Diagnosis not present

## 2014-02-01 DIAGNOSIS — Z79899 Other long term (current) drug therapy: Secondary | ICD-10-CM | POA: Diagnosis not present

## 2014-02-01 DIAGNOSIS — Z01812 Encounter for preprocedural laboratory examination: Secondary | ICD-10-CM | POA: Diagnosis not present

## 2014-02-01 DIAGNOSIS — M1990 Unspecified osteoarthritis, unspecified site: Secondary | ICD-10-CM | POA: Diagnosis not present

## 2014-02-01 DIAGNOSIS — Z01818 Encounter for other preprocedural examination: Secondary | ICD-10-CM | POA: Diagnosis not present

## 2014-02-03 DIAGNOSIS — Z9889 Other specified postprocedural states: Secondary | ICD-10-CM | POA: Diagnosis not present

## 2014-02-03 DIAGNOSIS — Z01818 Encounter for other preprocedural examination: Secondary | ICD-10-CM | POA: Diagnosis not present

## 2014-02-03 DIAGNOSIS — M1610 Unilateral primary osteoarthritis, unspecified hip: Secondary | ICD-10-CM | POA: Diagnosis not present

## 2014-02-08 DIAGNOSIS — I10 Essential (primary) hypertension: Secondary | ICD-10-CM | POA: Diagnosis not present

## 2014-02-08 DIAGNOSIS — E7800 Pure hypercholesterolemia, unspecified: Secondary | ICD-10-CM | POA: Diagnosis present

## 2014-02-08 DIAGNOSIS — M259 Joint disorder, unspecified: Secondary | ICD-10-CM | POA: Diagnosis not present

## 2014-02-08 DIAGNOSIS — M1610 Unilateral primary osteoarthritis, unspecified hip: Secondary | ICD-10-CM | POA: Diagnosis not present

## 2014-02-08 DIAGNOSIS — I4891 Unspecified atrial fibrillation: Secondary | ICD-10-CM | POA: Diagnosis present

## 2014-02-08 DIAGNOSIS — M25859 Other specified joint disorders, unspecified hip: Secondary | ICD-10-CM | POA: Diagnosis not present

## 2014-02-08 DIAGNOSIS — F17200 Nicotine dependence, unspecified, uncomplicated: Secondary | ICD-10-CM | POA: Diagnosis present

## 2014-02-08 DIAGNOSIS — M169 Osteoarthritis of hip, unspecified: Secondary | ICD-10-CM | POA: Diagnosis present

## 2014-02-08 DIAGNOSIS — Z96649 Presence of unspecified artificial hip joint: Secondary | ICD-10-CM | POA: Diagnosis not present

## 2014-02-08 DIAGNOSIS — K219 Gastro-esophageal reflux disease without esophagitis: Secondary | ICD-10-CM | POA: Diagnosis present

## 2014-02-08 DIAGNOSIS — Z471 Aftercare following joint replacement surgery: Secondary | ICD-10-CM | POA: Diagnosis not present

## 2014-02-08 DIAGNOSIS — Z79899 Other long term (current) drug therapy: Secondary | ICD-10-CM | POA: Diagnosis not present

## 2014-02-08 DIAGNOSIS — Z7982 Long term (current) use of aspirin: Secondary | ICD-10-CM | POA: Diagnosis not present

## 2014-02-12 DIAGNOSIS — R269 Unspecified abnormalities of gait and mobility: Secondary | ICD-10-CM | POA: Diagnosis not present

## 2014-02-12 DIAGNOSIS — Z96649 Presence of unspecified artificial hip joint: Secondary | ICD-10-CM | POA: Diagnosis not present

## 2014-02-12 DIAGNOSIS — IMO0001 Reserved for inherently not codable concepts without codable children: Secondary | ICD-10-CM | POA: Diagnosis not present

## 2014-02-12 DIAGNOSIS — Z471 Aftercare following joint replacement surgery: Secondary | ICD-10-CM | POA: Diagnosis not present

## 2014-03-25 DIAGNOSIS — M1610 Unilateral primary osteoarthritis, unspecified hip: Secondary | ICD-10-CM | POA: Diagnosis not present

## 2014-03-25 DIAGNOSIS — Z96649 Presence of unspecified artificial hip joint: Secondary | ICD-10-CM | POA: Diagnosis not present

## 2014-04-08 DIAGNOSIS — B009 Herpesviral infection, unspecified: Secondary | ICD-10-CM | POA: Diagnosis not present

## 2014-04-08 DIAGNOSIS — L578 Other skin changes due to chronic exposure to nonionizing radiation: Secondary | ICD-10-CM | POA: Diagnosis not present

## 2014-05-04 DIAGNOSIS — M25559 Pain in unspecified hip: Secondary | ICD-10-CM | POA: Diagnosis not present

## 2014-05-04 DIAGNOSIS — M1710 Unilateral primary osteoarthritis, unspecified knee: Secondary | ICD-10-CM | POA: Diagnosis not present

## 2014-05-04 DIAGNOSIS — IMO0002 Reserved for concepts with insufficient information to code with codable children: Secondary | ICD-10-CM | POA: Diagnosis not present

## 2014-05-10 DIAGNOSIS — Z1231 Encounter for screening mammogram for malignant neoplasm of breast: Secondary | ICD-10-CM | POA: Diagnosis not present

## 2014-05-10 DIAGNOSIS — Z01419 Encounter for gynecological examination (general) (routine) without abnormal findings: Secondary | ICD-10-CM | POA: Diagnosis not present

## 2014-06-08 DIAGNOSIS — E782 Mixed hyperlipidemia: Secondary | ICD-10-CM | POA: Diagnosis not present

## 2014-06-08 DIAGNOSIS — I129 Hypertensive chronic kidney disease with stage 1 through stage 4 chronic kidney disease, or unspecified chronic kidney disease: Secondary | ICD-10-CM | POA: Diagnosis not present

## 2014-06-08 DIAGNOSIS — N183 Chronic kidney disease, stage 3 unspecified: Secondary | ICD-10-CM | POA: Diagnosis not present

## 2014-06-10 DIAGNOSIS — M810 Age-related osteoporosis without current pathological fracture: Secondary | ICD-10-CM | POA: Diagnosis not present

## 2014-06-15 DIAGNOSIS — IMO0002 Reserved for concepts with insufficient information to code with codable children: Secondary | ICD-10-CM | POA: Diagnosis not present

## 2014-06-15 DIAGNOSIS — N183 Chronic kidney disease, stage 3 unspecified: Secondary | ICD-10-CM | POA: Diagnosis not present

## 2014-06-15 DIAGNOSIS — I129 Hypertensive chronic kidney disease with stage 1 through stage 4 chronic kidney disease, or unspecified chronic kidney disease: Secondary | ICD-10-CM | POA: Diagnosis not present

## 2014-06-15 DIAGNOSIS — E782 Mixed hyperlipidemia: Secondary | ICD-10-CM | POA: Diagnosis not present

## 2014-06-18 DIAGNOSIS — Z86010 Personal history of colonic polyps: Secondary | ICD-10-CM | POA: Diagnosis not present

## 2014-06-18 DIAGNOSIS — Z1211 Encounter for screening for malignant neoplasm of colon: Secondary | ICD-10-CM | POA: Diagnosis not present

## 2014-06-18 DIAGNOSIS — K5900 Constipation, unspecified: Secondary | ICD-10-CM | POA: Diagnosis not present

## 2014-06-24 DIAGNOSIS — Z96649 Presence of unspecified artificial hip joint: Secondary | ICD-10-CM | POA: Diagnosis not present

## 2014-06-28 DIAGNOSIS — K745 Biliary cirrhosis, unspecified: Secondary | ICD-10-CM | POA: Diagnosis not present

## 2014-06-28 DIAGNOSIS — K759 Inflammatory liver disease, unspecified: Secondary | ICD-10-CM | POA: Diagnosis not present

## 2014-07-01 DIAGNOSIS — M949 Disorder of cartilage, unspecified: Secondary | ICD-10-CM | POA: Diagnosis not present

## 2014-07-01 DIAGNOSIS — M899 Disorder of bone, unspecified: Secondary | ICD-10-CM | POA: Diagnosis not present

## 2014-07-05 DIAGNOSIS — H26499 Other secondary cataract, unspecified eye: Secondary | ICD-10-CM | POA: Diagnosis not present

## 2014-08-10 DIAGNOSIS — Z23 Encounter for immunization: Secondary | ICD-10-CM | POA: Diagnosis not present

## 2014-08-19 DIAGNOSIS — Z1211 Encounter for screening for malignant neoplasm of colon: Secondary | ICD-10-CM | POA: Diagnosis not present

## 2014-08-19 DIAGNOSIS — Z86010 Personal history of colonic polyps: Secondary | ICD-10-CM | POA: Diagnosis not present

## 2014-09-09 DIAGNOSIS — Z1211 Encounter for screening for malignant neoplasm of colon: Secondary | ICD-10-CM | POA: Diagnosis not present

## 2014-09-09 DIAGNOSIS — Z86010 Personal history of colonic polyps: Secondary | ICD-10-CM | POA: Diagnosis not present

## 2014-09-09 DIAGNOSIS — Z79899 Other long term (current) drug therapy: Secondary | ICD-10-CM | POA: Diagnosis not present

## 2014-09-09 DIAGNOSIS — I10 Essential (primary) hypertension: Secondary | ICD-10-CM | POA: Diagnosis not present

## 2014-09-09 DIAGNOSIS — D127 Benign neoplasm of rectosigmoid junction: Secondary | ICD-10-CM | POA: Diagnosis not present

## 2014-09-09 DIAGNOSIS — Z7982 Long term (current) use of aspirin: Secondary | ICD-10-CM | POA: Diagnosis not present

## 2014-09-09 DIAGNOSIS — K635 Polyp of colon: Secondary | ICD-10-CM | POA: Diagnosis not present

## 2014-09-09 DIAGNOSIS — Z79890 Hormone replacement therapy (postmenopausal): Secondary | ICD-10-CM | POA: Diagnosis not present

## 2014-09-09 DIAGNOSIS — K5730 Diverticulosis of large intestine without perforation or abscess without bleeding: Secondary | ICD-10-CM | POA: Diagnosis not present

## 2014-09-09 DIAGNOSIS — Z87891 Personal history of nicotine dependence: Secondary | ICD-10-CM | POA: Diagnosis not present

## 2014-09-09 DIAGNOSIS — K648 Other hemorrhoids: Secondary | ICD-10-CM | POA: Diagnosis not present

## 2014-09-15 DIAGNOSIS — M546 Pain in thoracic spine: Secondary | ICD-10-CM | POA: Diagnosis not present

## 2014-09-15 DIAGNOSIS — R262 Difficulty in walking, not elsewhere classified: Secondary | ICD-10-CM | POA: Diagnosis not present

## 2014-09-15 DIAGNOSIS — M6281 Muscle weakness (generalized): Secondary | ICD-10-CM | POA: Diagnosis not present

## 2014-09-22 DIAGNOSIS — M546 Pain in thoracic spine: Secondary | ICD-10-CM | POA: Diagnosis not present

## 2014-09-22 DIAGNOSIS — R262 Difficulty in walking, not elsewhere classified: Secondary | ICD-10-CM | POA: Diagnosis not present

## 2014-09-22 DIAGNOSIS — M6281 Muscle weakness (generalized): Secondary | ICD-10-CM | POA: Diagnosis not present

## 2014-09-24 DIAGNOSIS — M546 Pain in thoracic spine: Secondary | ICD-10-CM | POA: Diagnosis not present

## 2014-09-24 DIAGNOSIS — M6281 Muscle weakness (generalized): Secondary | ICD-10-CM | POA: Diagnosis not present

## 2014-09-24 DIAGNOSIS — R262 Difficulty in walking, not elsewhere classified: Secondary | ICD-10-CM | POA: Diagnosis not present

## 2014-09-28 DIAGNOSIS — M6281 Muscle weakness (generalized): Secondary | ICD-10-CM | POA: Diagnosis not present

## 2014-09-28 DIAGNOSIS — R262 Difficulty in walking, not elsewhere classified: Secondary | ICD-10-CM | POA: Diagnosis not present

## 2014-09-28 DIAGNOSIS — M546 Pain in thoracic spine: Secondary | ICD-10-CM | POA: Diagnosis not present

## 2014-09-30 DIAGNOSIS — M546 Pain in thoracic spine: Secondary | ICD-10-CM | POA: Diagnosis not present

## 2014-09-30 DIAGNOSIS — R262 Difficulty in walking, not elsewhere classified: Secondary | ICD-10-CM | POA: Diagnosis not present

## 2014-09-30 DIAGNOSIS — M6281 Muscle weakness (generalized): Secondary | ICD-10-CM | POA: Diagnosis not present

## 2014-10-25 ENCOUNTER — Ambulatory Visit (INDEPENDENT_AMBULATORY_CARE_PROVIDER_SITE_OTHER): Payer: Medicare Other | Admitting: Internal Medicine

## 2014-10-25 ENCOUNTER — Encounter: Payer: Self-pay | Admitting: Internal Medicine

## 2014-10-25 VITALS — BP 120/88 | HR 98 | Ht 65.0 in | Wt 128.4 lb

## 2014-10-25 DIAGNOSIS — I480 Paroxysmal atrial fibrillation: Secondary | ICD-10-CM | POA: Diagnosis not present

## 2014-10-25 DIAGNOSIS — I10 Essential (primary) hypertension: Secondary | ICD-10-CM | POA: Diagnosis not present

## 2014-10-25 DIAGNOSIS — Z720 Tobacco use: Secondary | ICD-10-CM | POA: Diagnosis not present

## 2014-10-25 DIAGNOSIS — Z23 Encounter for immunization: Secondary | ICD-10-CM | POA: Diagnosis not present

## 2014-10-25 DIAGNOSIS — I4891 Unspecified atrial fibrillation: Secondary | ICD-10-CM | POA: Diagnosis not present

## 2014-10-25 NOTE — Progress Notes (Signed)
PCP: Marco Collie, MD  Abigail Sullivan is a 68 y.o. female who presents today for routine electrophysiology followup.  Since last being seen in our clinic, the patient reports doing very well.  Her afib is well controlled.  She underwent hip surgery this past year.  Her primary concern is with orthopedic issues.  Today, she denies symptoms of chest pain, shortness of breath,  lower extremity edema, dizziness, presyncope, or syncope.  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   The patient is otherwise without complaint today.   Past Medical History  Diagnosis Date  . Hyperlipidemia   . Hypertension   . Hepatitis   . Atrial fibrillation     post-operative   . Premature atrial contractions     systomatic PAC's and documented SVT ( likley atrial tachycardia )   Past Surgical History  Procedure Laterality Date  . Lumbar laminectomy/decompression microdiscectomy  02/04/2011    L2-L3 and L3-L4 with diskectomy and plating    Current Outpatient Prescriptions  Medication Sig Dispense Refill  . acyclovir (ZOVIRAX) 200 MG capsule Take 200 mg by mouth 2 (two) times daily.     Marland Kitchen aspirin EC 81 MG tablet Take 1 tablet (81 mg total) by mouth daily. 90 tablet 3  . Calcium Carbonate-Vitamin D (CALCIUM + D) 600-200 MG-UNIT TABS Take 3 tablets by mouth daily.     Marland Kitchen diltiazem (CARDIZEM CD) 240 MG 24 hr capsule Take 1 capsule (240 mg total) by mouth daily. 90 capsule 3  . Doxylamine Succinate, Sleep, (SLEEP AID PO) Take 100 mg by mouth at bedtime.     Marland Kitchen estradiol (ESTRACE) 1 MG tablet TAKE ONE TABLET BY MOUTH DAILY    . lisinopril (PRINIVIL,ZESTRIL) 10 MG tablet Take 10 mg by mouth daily.      . medroxyPROGESTERone (PROVERA) 2.5 MG tablet Take 1 tablet by mouth daily.    . multivitamin (THERAGRAN) per tablet Take 1 tablet by mouth daily.      . Omega-3 Fatty Acids (FISH OIL PO) Take 3 capsules by mouth daily.      . simvastatin (ZOCOR) 20 MG tablet Take 20 mg by mouth at bedtime.      .  ursodiol (ACTIGALL) 300 MG capsule Take 300 mg by mouth 2 (two) times daily.       No current facility-administered medications for this visit.    Physical Exam: Filed Vitals:   10/25/14 0940  BP: 120/88  Pulse: 98  Height: 5\' 5"  (1.651 m)  Weight: 128 lb 6.4 oz (58.242 kg)    GEN- The patient is well appearing, alert and oriented x 3 today.   Head- normocephalic, atraumatic Eyes-  Sclera clear, conjunctiva pink Ears- hearing intact Oropharynx- clear Lungs- Clear to ausculation bilaterally, normal work of breathing Heart- Regular rate and rhythm, no murmurs, rubs or gallops, PMI not laterally displaced GI- soft, NT, ND, + BS Extremities- no clubbing, cyanosis, or edema  ekg today reveals sinus rhythm with occasional PACs, otherwise normal ekg  Assessment and Plan:  1. afib Well controlled CHADS2VASC score is 3.  She is very clear that she does not want to take anticoagulation at this time.  She is aware that her annual risk for stroke is 2-3%. Decrease ASA to 81mg  daily today.  We discussed AVERROES data as an alterative again today  2. HTN Stable No change required today She wishes to have bmet followed by primary care  3. Tobacco Cessation advised.  She is clear  that she will not quit  4. HL She wishes to have LFTs/Lipids by primary care As she is on diltiazem, would consider decreasing simvastatin to 10mg  daily.  Will defer to primary care.  Return in 1 year

## 2014-10-25 NOTE — Patient Instructions (Signed)
Your physician wants you to follow-up in: 12 months with Dr Allred You will receive a reminder letter in the mail two months in advance. If you don't receive a letter, please call our office to schedule the follow-up appointment.  

## 2014-12-06 ENCOUNTER — Other Ambulatory Visit: Payer: Self-pay | Admitting: *Deleted

## 2014-12-06 MED ORDER — DILTIAZEM HCL ER COATED BEADS 240 MG PO CP24: 240.0000 mg | ORAL_CAPSULE | Freq: Every day | ORAL | Status: DC

## 2014-12-07 DIAGNOSIS — I129 Hypertensive chronic kidney disease with stage 1 through stage 4 chronic kidney disease, or unspecified chronic kidney disease: Secondary | ICD-10-CM | POA: Diagnosis not present

## 2014-12-07 DIAGNOSIS — N183 Chronic kidney disease, stage 3 (moderate): Secondary | ICD-10-CM | POA: Diagnosis not present

## 2014-12-07 DIAGNOSIS — E782 Mixed hyperlipidemia: Secondary | ICD-10-CM | POA: Diagnosis not present

## 2014-12-14 DIAGNOSIS — K754 Autoimmune hepatitis: Secondary | ICD-10-CM | POA: Diagnosis not present

## 2014-12-14 DIAGNOSIS — N183 Chronic kidney disease, stage 3 (moderate): Secondary | ICD-10-CM | POA: Diagnosis not present

## 2014-12-14 DIAGNOSIS — I129 Hypertensive chronic kidney disease with stage 1 through stage 4 chronic kidney disease, or unspecified chronic kidney disease: Secondary | ICD-10-CM | POA: Diagnosis not present

## 2014-12-14 DIAGNOSIS — Z0000 Encounter for general adult medical examination without abnormal findings: Secondary | ICD-10-CM | POA: Diagnosis not present

## 2014-12-14 DIAGNOSIS — Z139 Encounter for screening, unspecified: Secondary | ICD-10-CM | POA: Diagnosis not present

## 2014-12-14 DIAGNOSIS — Z1389 Encounter for screening for other disorder: Secondary | ICD-10-CM | POA: Diagnosis not present

## 2015-01-28 DIAGNOSIS — K219 Gastro-esophageal reflux disease without esophagitis: Secondary | ICD-10-CM | POA: Diagnosis not present

## 2015-01-28 DIAGNOSIS — K745 Biliary cirrhosis, unspecified: Secondary | ICD-10-CM | POA: Diagnosis not present

## 2015-04-11 DIAGNOSIS — L821 Other seborrheic keratosis: Secondary | ICD-10-CM | POA: Diagnosis not present

## 2015-04-11 DIAGNOSIS — L578 Other skin changes due to chronic exposure to nonionizing radiation: Secondary | ICD-10-CM | POA: Diagnosis not present

## 2015-05-12 DIAGNOSIS — Z1231 Encounter for screening mammogram for malignant neoplasm of breast: Secondary | ICD-10-CM | POA: Diagnosis not present

## 2015-05-12 DIAGNOSIS — Z01419 Encounter for gynecological examination (general) (routine) without abnormal findings: Secondary | ICD-10-CM | POA: Diagnosis not present

## 2015-05-26 DIAGNOSIS — R922 Inconclusive mammogram: Secondary | ICD-10-CM | POA: Diagnosis not present

## 2015-05-26 DIAGNOSIS — R928 Other abnormal and inconclusive findings on diagnostic imaging of breast: Secondary | ICD-10-CM | POA: Diagnosis not present

## 2015-06-07 DIAGNOSIS — E782 Mixed hyperlipidemia: Secondary | ICD-10-CM | POA: Diagnosis not present

## 2015-06-07 DIAGNOSIS — N183 Chronic kidney disease, stage 3 (moderate): Secondary | ICD-10-CM | POA: Diagnosis not present

## 2015-06-07 DIAGNOSIS — I129 Hypertensive chronic kidney disease with stage 1 through stage 4 chronic kidney disease, or unspecified chronic kidney disease: Secondary | ICD-10-CM | POA: Diagnosis not present

## 2015-06-14 DIAGNOSIS — K754 Autoimmune hepatitis: Secondary | ICD-10-CM | POA: Diagnosis not present

## 2015-06-14 DIAGNOSIS — E782 Mixed hyperlipidemia: Secondary | ICD-10-CM | POA: Diagnosis not present

## 2015-06-14 DIAGNOSIS — N183 Chronic kidney disease, stage 3 (moderate): Secondary | ICD-10-CM | POA: Diagnosis not present

## 2015-06-14 DIAGNOSIS — I129 Hypertensive chronic kidney disease with stage 1 through stage 4 chronic kidney disease, or unspecified chronic kidney disease: Secondary | ICD-10-CM | POA: Diagnosis not present

## 2015-07-07 DIAGNOSIS — H26493 Other secondary cataract, bilateral: Secondary | ICD-10-CM | POA: Diagnosis not present

## 2015-08-31 DIAGNOSIS — Z23 Encounter for immunization: Secondary | ICD-10-CM | POA: Diagnosis not present

## 2015-09-13 DIAGNOSIS — L570 Actinic keratosis: Secondary | ICD-10-CM | POA: Diagnosis not present

## 2015-09-16 DIAGNOSIS — I214 Non-ST elevation (NSTEMI) myocardial infarction: Secondary | ICD-10-CM | POA: Diagnosis not present

## 2015-09-16 DIAGNOSIS — R197 Diarrhea, unspecified: Secondary | ICD-10-CM | POA: Diagnosis not present

## 2015-09-16 DIAGNOSIS — A09 Infectious gastroenteritis and colitis, unspecified: Secondary | ICD-10-CM | POA: Diagnosis not present

## 2015-09-16 DIAGNOSIS — I249 Acute ischemic heart disease, unspecified: Secondary | ICD-10-CM | POA: Diagnosis not present

## 2015-09-16 DIAGNOSIS — R404 Transient alteration of awareness: Secondary | ICD-10-CM | POA: Diagnosis not present

## 2015-09-16 DIAGNOSIS — Z79899 Other long term (current) drug therapy: Secondary | ICD-10-CM | POA: Diagnosis not present

## 2015-09-16 DIAGNOSIS — I482 Chronic atrial fibrillation: Secondary | ICD-10-CM | POA: Diagnosis not present

## 2015-09-16 DIAGNOSIS — K922 Gastrointestinal hemorrhage, unspecified: Secondary | ICD-10-CM | POA: Diagnosis not present

## 2015-09-16 DIAGNOSIS — I517 Cardiomegaly: Secondary | ICD-10-CM | POA: Diagnosis not present

## 2015-09-16 DIAGNOSIS — I4892 Unspecified atrial flutter: Secondary | ICD-10-CM | POA: Diagnosis not present

## 2015-09-16 DIAGNOSIS — I491 Atrial premature depolarization: Secondary | ICD-10-CM | POA: Diagnosis not present

## 2015-09-16 DIAGNOSIS — R531 Weakness: Secondary | ICD-10-CM | POA: Diagnosis not present

## 2015-09-16 DIAGNOSIS — R110 Nausea: Secondary | ICD-10-CM | POA: Diagnosis not present

## 2015-09-16 DIAGNOSIS — M1990 Unspecified osteoarthritis, unspecified site: Secondary | ICD-10-CM | POA: Diagnosis present

## 2015-09-16 DIAGNOSIS — I484 Atypical atrial flutter: Secondary | ICD-10-CM | POA: Diagnosis not present

## 2015-09-16 DIAGNOSIS — D500 Iron deficiency anemia secondary to blood loss (chronic): Secondary | ICD-10-CM | POA: Diagnosis present

## 2015-09-16 DIAGNOSIS — N179 Acute kidney failure, unspecified: Secondary | ICD-10-CM | POA: Diagnosis not present

## 2015-09-16 DIAGNOSIS — E785 Hyperlipidemia, unspecified: Secondary | ICD-10-CM | POA: Diagnosis present

## 2015-09-16 DIAGNOSIS — K529 Noninfective gastroenteritis and colitis, unspecified: Secondary | ICD-10-CM | POA: Diagnosis not present

## 2015-09-16 DIAGNOSIS — R1110 Vomiting, unspecified: Secondary | ICD-10-CM | POA: Diagnosis not present

## 2015-09-16 DIAGNOSIS — R1013 Epigastric pain: Secondary | ICD-10-CM | POA: Diagnosis not present

## 2015-09-16 DIAGNOSIS — R112 Nausea with vomiting, unspecified: Secondary | ICD-10-CM | POA: Diagnosis not present

## 2015-09-16 DIAGNOSIS — I10 Essential (primary) hypertension: Secondary | ICD-10-CM | POA: Diagnosis not present

## 2015-09-16 DIAGNOSIS — Z7982 Long term (current) use of aspirin: Secondary | ICD-10-CM | POA: Diagnosis not present

## 2015-09-16 DIAGNOSIS — Z23 Encounter for immunization: Secondary | ICD-10-CM | POA: Diagnosis not present

## 2015-09-16 DIAGNOSIS — E860 Dehydration: Secondary | ICD-10-CM | POA: Diagnosis not present

## 2015-09-16 DIAGNOSIS — F17210 Nicotine dependence, cigarettes, uncomplicated: Secondary | ICD-10-CM | POA: Diagnosis present

## 2015-09-16 DIAGNOSIS — R079 Chest pain, unspecified: Secondary | ICD-10-CM | POA: Diagnosis not present

## 2015-09-27 DIAGNOSIS — I4891 Unspecified atrial fibrillation: Secondary | ICD-10-CM | POA: Diagnosis not present

## 2015-09-27 DIAGNOSIS — K922 Gastrointestinal hemorrhage, unspecified: Secondary | ICD-10-CM | POA: Diagnosis not present

## 2015-09-27 DIAGNOSIS — I214 Non-ST elevation (NSTEMI) myocardial infarction: Secondary | ICD-10-CM | POA: Diagnosis not present

## 2015-09-27 DIAGNOSIS — K529 Noninfective gastroenteritis and colitis, unspecified: Secondary | ICD-10-CM | POA: Diagnosis not present

## 2015-09-28 DIAGNOSIS — R197 Diarrhea, unspecified: Secondary | ICD-10-CM | POA: Diagnosis not present

## 2015-10-03 DIAGNOSIS — E876 Hypokalemia: Secondary | ICD-10-CM | POA: Diagnosis not present

## 2015-10-04 ENCOUNTER — Encounter: Payer: Self-pay | Admitting: Internal Medicine

## 2015-10-06 ENCOUNTER — Telehealth: Payer: Self-pay | Admitting: Internal Medicine

## 2015-10-06 NOTE — Telephone Encounter (Signed)
New Message  Pt calling to speak w/ RN concernig her hospital f/u- seen @ [facility]. Please call back and discuss.

## 2015-10-06 NOTE — Telephone Encounter (Signed)
I spoke with the pt and she was admitted to [facility] 2 weeks ago with dehydration, vomiting and diarrhea.  During her hospitalization she was told that she had a heart attack.  The physicians felt that this was related to infection.  When the pt was discharged she was given all documents and told to follow-up with Cardiology. The pt would like to send this information into our office for Dr Rayann Heman to review prior to her appointment on 11/02/15. The pt plans to send this through certified mailed to our office.  I instructed her to send them Attn: Dr Rayann Heman and Janan Halter RN.  Pt agreed with plan.

## 2015-10-10 ENCOUNTER — Telehealth: Payer: Self-pay | Admitting: Internal Medicine

## 2015-10-10 NOTE — Telephone Encounter (Signed)
New message     FYI Pt c/o medication issue:  1. Name of Medication: lipitor 2. How are you currently taking this medication (dosage and times per day)?  Stopped rx 3. Are you having a reaction (difficulty breathing--STAT)? no 4. What is your medication issue?  Patient want Dr Rayann Heman to know that she is no longer taking lipitor because of severe leg cramps.  She is now taking simvastatin 20mg  daily.  She has been on this medication for 2wks and no leg cramps.  Changes were made when she was in the hosp.

## 2015-10-10 NOTE — Telephone Encounter (Signed)
Changes are present on medication list

## 2015-10-28 ENCOUNTER — Ambulatory Visit: Payer: Medicare Other | Admitting: Internal Medicine

## 2015-11-02 ENCOUNTER — Ambulatory Visit (INDEPENDENT_AMBULATORY_CARE_PROVIDER_SITE_OTHER): Payer: Medicare Other | Admitting: Internal Medicine

## 2015-11-02 ENCOUNTER — Ambulatory Visit: Payer: Medicare Other | Admitting: Internal Medicine

## 2015-11-02 ENCOUNTER — Encounter: Payer: Self-pay | Admitting: Internal Medicine

## 2015-11-02 VITALS — BP 120/70 | HR 101 | Ht 64.0 in | Wt 134.6 lb

## 2015-11-02 DIAGNOSIS — I214 Non-ST elevation (NSTEMI) myocardial infarction: Secondary | ICD-10-CM

## 2015-11-02 MED ORDER — NITROGLYCERIN 0.4 MG SL SUBL: 0.4000 mg | SUBLINGUAL_TABLET | SUBLINGUAL | Status: DC | PRN

## 2015-11-02 MED ORDER — DILTIAZEM HCL ER COATED BEADS 240 MG PO CP24: 240.0000 mg | ORAL_CAPSULE | Freq: Every day | ORAL | Status: DC

## 2015-11-02 NOTE — Patient Instructions (Addendum)
Medication Instructions:  Your physician recommends that you continue on your current medications as directed. Please refer to the Current Medication list given to you today.   Labwork: None ordered   Testing/Procedures: Your physician has requested that you have a lexiscan myoview. For further information please visit [email or web address]. Please follow instruction sheet, as given.    Follow-Up: Your physician wants you to follow-up in: 12 months with Dr Vallery Ridge will receive a reminder letter in the mail two months in advance. If you don't receive a letter, please call our office to schedule the follow-up appointment.   Any Other Special Instructions Will Be Listed Below (If Applicable).     If you need a refill on your cardiac medications before your next appointment, please call your pharmacy.

## 2015-11-03 NOTE — Progress Notes (Signed)
PCP: Marco Collie, MD  Abigail Sullivan is a 69 y.o. female who presents today for routine electrophysiology followup.  Since last being seen in our clinic, the patient reports doing very well.  Her afib is well controlled.  She was hospitalized 10/16 for GI bleed.  Her GI workup remains pending.  I have reviewed multiple records from her hospitalization.  This reveals that she had elevated troponin without significant ekg changes.  Echo revealed no WMA.  She has done well since.  She has occasional SOB.  Today, she denies symptoms of chest pain, shortness of breath,  lower extremity edema, dizziness, presyncope, or syncope.  The patient is otherwise without complaint today.   Past Medical History  Diagnosis Date  . Hyperlipidemia   . Hypertension   . Hepatitis   . Atrial fibrillation (HCC)     post-operative   . Premature atrial contractions     systomatic PAC's and documented SVT ( likley atrial tachycardia )   Past Surgical History  Procedure Laterality Date  . Lumbar laminectomy/decompression microdiscectomy  02/04/2011    L2-L3 and L3-L4 with diskectomy and plating    Current Outpatient Prescriptions  Medication Sig Dispense Refill  . acyclovir (ZOVIRAX) 200 MG capsule Take 200 mg by mouth 2 (two) times daily.     . Calcium Carbonate-Vitamin D (CALCIUM + D) 600-200 MG-UNIT TABS Take 1 tablet by mouth daily.     Marland Kitchen diltiazem (CARDIZEM CD) 240 MG 24 hr capsule Take 1 capsule (240 mg total) by mouth daily. 90 capsule 3  . Doxylamine Succinate, Sleep, (SLEEP AID PO) Take 100 mg by mouth at bedtime.     Marland Kitchen estradiol (ESTRACE) 1 MG tablet TAKE ONE TABLET BY MOUTH DAILY    . lisinopril (PRINIVIL,ZESTRIL) 10 MG tablet Take 10 mg by mouth daily.      . medroxyPROGESTERone (PROVERA) 2.5 MG tablet Take 1 tablet by mouth daily.    . multivitamin (THERAGRAN) per tablet Take 1 tablet by mouth daily.      . nitroGLYCERIN (NITROSTAT) 0.4 MG SL tablet Place 1 tablet (0.4 mg total) under the tongue every 5  (five) minutes x 3 doses as needed for chest pain. If no relief after 3 doses, call 911 25 tablet 3  . Omega-3 Fatty Acids (FISH OIL PO) Take 3 capsules by mouth daily.      . Probiotic Product (PROBIOTIC PO) Take 1 capsule by mouth daily.    . simvastatin (ZOCOR) 20 MG tablet Take 20 mg by mouth at bedtime.      . ursodiol (ACTIGALL) 300 MG capsule Take 300 mg by mouth 2 (two) times daily.       No current facility-administered medications for this visit.    Physical Exam: Filed Vitals:   11/02/15 1552  BP: 120/70  Pulse: 101  Height: 5\' 4"  (1.626 m)  Weight: 134 lb 9.6 oz (61.054 kg)    GEN- The patient is well appearing, alert and oriented x 3 today.   Head- normocephalic, atraumatic Eyes-  Sclera clear, conjunctiva pink Ears- hearing intact Oropharynx- clear Lungs- Clear to ausculation bilaterally, normal work of breathing Heart- Regular rate and rhythm, no murmurs, rubs or gallops, PMI not laterally displaced GI- soft, NT, ND, + BS Extremities- no clubbing, cyanosis, or edema  ekg today reveals sinus rhythm with occasional PACs, otherwise normal ekg  Assessment and Plan:  1. afib Well controlled CHADS2VASC score is 3.  She is very clear that she does not want to take anticoagulation  at this time.  She was previously on ASA but due to recent GI bleed, this is on hold.  She would like to restart ASA if upcoming GI workup is negative.  I have advised eliquis long term which she declines. Could consider watchman device, though we did not discuss this today  2. HTN Stable No change required today  3. Tobacco Cessation advised.  She is clear that she will not quit  4. Recent NSTEMI Given robust troponin elevation in setting of GI bleed, she may benefit from stress testing.  Due to orthopedic issues, she is not very active and thus exercise tolerance is difficult to assess. Lexiscan myoview  Return in 1 year

## 2015-11-08 DIAGNOSIS — Z8719 Personal history of other diseases of the digestive system: Secondary | ICD-10-CM | POA: Diagnosis not present

## 2015-11-08 DIAGNOSIS — D649 Anemia, unspecified: Secondary | ICD-10-CM | POA: Diagnosis not present

## 2015-11-08 DIAGNOSIS — R112 Nausea with vomiting, unspecified: Secondary | ICD-10-CM | POA: Diagnosis not present

## 2015-11-11 ENCOUNTER — Encounter: Payer: Self-pay | Admitting: Internal Medicine

## 2015-11-11 ENCOUNTER — Telehealth: Payer: Self-pay | Admitting: Internal Medicine

## 2015-11-11 NOTE — Telephone Encounter (Signed)
°  New Prob  Pt states she spoke with Thressa Sheller regarding having records sent to her medical team. Fax numbers below:  PCP: Dr. Marco Collie Fax [phone] GI: Dr. Carmell Austria Fax [phone]

## 2015-11-11 NOTE — Telephone Encounter (Signed)
Office note sent to both MD's

## 2015-11-14 ENCOUNTER — Encounter: Payer: Self-pay | Admitting: Internal Medicine

## 2015-11-16 ENCOUNTER — Encounter (HOSPITAL_COMMUNITY): Payer: Medicare Other

## 2015-12-06 DIAGNOSIS — J40 Bronchitis, not specified as acute or chronic: Secondary | ICD-10-CM | POA: Diagnosis not present

## 2015-12-06 DIAGNOSIS — Z6823 Body mass index (BMI) 23.0-23.9, adult: Secondary | ICD-10-CM | POA: Diagnosis not present

## 2015-12-07 DIAGNOSIS — E782 Mixed hyperlipidemia: Secondary | ICD-10-CM | POA: Diagnosis not present

## 2015-12-07 DIAGNOSIS — I129 Hypertensive chronic kidney disease with stage 1 through stage 4 chronic kidney disease, or unspecified chronic kidney disease: Secondary | ICD-10-CM | POA: Diagnosis not present

## 2015-12-13 DIAGNOSIS — J40 Bronchitis, not specified as acute or chronic: Secondary | ICD-10-CM | POA: Diagnosis not present

## 2015-12-19 ENCOUNTER — Other Ambulatory Visit: Payer: Self-pay

## 2015-12-19 DIAGNOSIS — R112 Nausea with vomiting, unspecified: Secondary | ICD-10-CM | POA: Diagnosis not present

## 2015-12-19 DIAGNOSIS — K2900 Acute gastritis without bleeding: Secondary | ICD-10-CM | POA: Diagnosis not present

## 2015-12-19 DIAGNOSIS — Z8719 Personal history of other diseases of the digestive system: Secondary | ICD-10-CM | POA: Diagnosis not present

## 2015-12-19 DIAGNOSIS — I10 Essential (primary) hypertension: Secondary | ICD-10-CM | POA: Diagnosis not present

## 2015-12-19 DIAGNOSIS — I252 Old myocardial infarction: Secondary | ICD-10-CM | POA: Diagnosis not present

## 2015-12-19 DIAGNOSIS — K2970 Gastritis, unspecified, without bleeding: Secondary | ICD-10-CM | POA: Diagnosis not present

## 2015-12-26 ENCOUNTER — Telehealth (HOSPITAL_COMMUNITY): Payer: Self-pay | Admitting: *Deleted

## 2015-12-29 DIAGNOSIS — I4891 Unspecified atrial fibrillation: Secondary | ICD-10-CM | POA: Diagnosis not present

## 2015-12-29 DIAGNOSIS — E782 Mixed hyperlipidemia: Secondary | ICD-10-CM | POA: Diagnosis not present

## 2015-12-29 DIAGNOSIS — N183 Chronic kidney disease, stage 3 (moderate): Secondary | ICD-10-CM | POA: Diagnosis not present

## 2015-12-29 DIAGNOSIS — I129 Hypertensive chronic kidney disease with stage 1 through stage 4 chronic kidney disease, or unspecified chronic kidney disease: Secondary | ICD-10-CM | POA: Diagnosis not present

## 2016-01-04 ENCOUNTER — Telehealth (HOSPITAL_COMMUNITY): Payer: Self-pay | Admitting: *Deleted

## 2016-01-04 NOTE — Telephone Encounter (Signed)
Patient given detailed instructions per Myocardial Perfusion Study Information Sheet for the test on 01/09/16 at 1000 Patient notified to arrive 15 minutes early and that it is imperative to arrive on time for appointment to keep from having the test rescheduled.  If you need to cancel or reschedule your appointment, please call the office within 24 hours of your appointment. Failure to do so may result in a cancellation of your appointment, and a $50 no show fee. Patient verbalized understanding.Hubbard Robinson, RN

## 2016-01-09 ENCOUNTER — Ambulatory Visit (HOSPITAL_COMMUNITY): Payer: Medicare Other | Attending: Internal Medicine

## 2016-01-09 DIAGNOSIS — R9439 Abnormal result of other cardiovascular function study: Secondary | ICD-10-CM | POA: Insufficient documentation

## 2016-01-09 DIAGNOSIS — F17200 Nicotine dependence, unspecified, uncomplicated: Secondary | ICD-10-CM | POA: Diagnosis not present

## 2016-01-09 DIAGNOSIS — I10 Essential (primary) hypertension: Secondary | ICD-10-CM | POA: Diagnosis not present

## 2016-01-09 DIAGNOSIS — R0602 Shortness of breath: Secondary | ICD-10-CM | POA: Insufficient documentation

## 2016-01-09 DIAGNOSIS — I214 Non-ST elevation (NSTEMI) myocardial infarction: Secondary | ICD-10-CM

## 2016-01-09 DIAGNOSIS — I4891 Unspecified atrial fibrillation: Secondary | ICD-10-CM | POA: Insufficient documentation

## 2016-01-09 LAB — MYOCARDIAL PERFUSION IMAGING
CHL CUP NUCLEAR SSS: 2
CSEPPHR: 117 {beats}/min
LHR: 0.3
LV dias vol: 77 mL
LVSYSVOL: 29 mL
NUC STRESS TID: 0.74
Rest HR: 93 {beats}/min
SDS: 2
SRS: 0

## 2016-01-09 MED ORDER — TECHNETIUM TC 99M SESTAMIBI GENERIC - CARDIOLITE
32.6000 | Freq: Once | INTRAVENOUS | Status: AC | PRN
  Administered 2016-01-09: 33 via INTRAVENOUS

## 2016-01-09 MED ORDER — TECHNETIUM TC 99M SESTAMIBI GENERIC - CARDIOLITE
10.3000 | Freq: Once | INTRAVENOUS | Status: AC | PRN
  Administered 2016-01-09: 10 via INTRAVENOUS

## 2016-01-09 MED ORDER — REGADENOSON 0.4 MG/5ML IV SOLN
0.4000 mg | Freq: Once | INTRAVENOUS | Status: AC
  Administered 2016-01-09: 0.4 mg via INTRAVENOUS

## 2016-01-16 ENCOUNTER — Telehealth: Payer: Self-pay | Admitting: Internal Medicine

## 2016-01-16 NOTE — Telephone Encounter (Signed)
New message   Pt states that she is returning rn call

## 2016-01-17 ENCOUNTER — Other Ambulatory Visit: Payer: Self-pay | Admitting: Internal Medicine

## 2016-01-17 MED ORDER — APIXABAN 5 MG PO TABS
5.0000 mg | ORAL_TABLET | Freq: Two times a day (BID) | ORAL | Status: DC
Start: 2016-01-17 — End: 2016-01-25

## 2016-01-17 NOTE — Telephone Encounter (Signed)
Discussed with Dr Rayann Heman, she may start Eliquis 5 mg bid and stop Aspirin. She will need a 4 week follow up in the anti-coagulation clinic.  Patient aware. Rx sent in and I have mailed patient a 30 day free trial card.  She is worried about the cost and I have told her that we can do tier exceptions and will handle that for her.

## 2016-01-18 ENCOUNTER — Telehealth: Payer: Self-pay | Admitting: Internal Medicine

## 2016-01-19 ENCOUNTER — Telehealth: Payer: Self-pay | Admitting: Internal Medicine

## 2016-01-19 NOTE — Telephone Encounter (Signed)
Pt calling to check if she can get a "waiver" for the Eliquis to reduce the cost, her husband picked up her med and it was 366 for 1 month, she asked him why he didn't wait for the 30 free card and he said because it was ready at the pharmacy, will she still be able to use it? Also took asa today, should she start taking it tomorrow or wait until she see's Allred 01-24-16? PLS CALL [phone]

## 2016-01-19 NOTE — Telephone Encounter (Signed)
Called patient and let her know that I had mailed her a 30 day free card which we discussed.  She said she knew but her husband picked it up anyway.  She called yesterday and requested an appointment with Dr Rayann Heman to discuss Eliquis.  I tried explaining to her that she would need to come in to the CV[facility] clinic in 4 weeks to see how things were going on the medication.  She wants to discuss with Dr Rayann Heman still.  Melissa made her an appointment with Dr Rayann Heman for 2/22.  She called yesterday with multiple questions and Melissa let her know she could forward these to me.  She stated she would feel better discussing with Dr Rayann Heman as he is the one who prescribed.  The questions that I answered were.Marland Kitchen 1) What kind of SE should she expect, 2) How does she take(ok to take in the morning with all her other meds), 3) Does it have to be 12 hours apart? 4) What type things should she look for?  I tried to answer her questions but unfortunately she has more.  Over the last 2 days she has called multiple times with numerous questions.  Very anxious about the medication and in general

## 2016-01-25 ENCOUNTER — Ambulatory Visit (INDEPENDENT_AMBULATORY_CARE_PROVIDER_SITE_OTHER): Payer: Medicare Other | Admitting: Pharmacist

## 2016-01-25 ENCOUNTER — Ambulatory Visit: Payer: Medicare Other | Admitting: Internal Medicine

## 2016-01-25 DIAGNOSIS — I4891 Unspecified atrial fibrillation: Secondary | ICD-10-CM | POA: Diagnosis not present

## 2016-01-25 MED ORDER — RIVAROXABAN 20 MG PO TABS: 20.0000 mg | ORAL_TABLET | Freq: Every day | ORAL | Status: DC

## 2016-01-25 NOTE — Patient Instructions (Signed)
Continue your Eliquis until you run out.  Change to Xarelto 20mg  daily.  Make sure you take it with food.   We will follow up in 6 weeks.

## 2016-01-25 NOTE — Progress Notes (Signed)
Pt seen today at the request of Dr. Rayann Heman.  She was started on anticoagulation with Eliquis on 2/14.  She had been hesitant to start anticoagulation in the past due to potential side effects.  At her visit with Dr. Rayann Heman in November 2016, he stressed the importance of anticoagulation again and patient called back in February ready to start.  During a hospital stay at [facility] in October, pt had a GI bleed so Eliquis was chosen.  After pt had Rx sent to the pharmacy, she called back on 2/15 and 2/16 with concerns over cost and side effects.  Her questions were answered but she had more so an appointment was made in the Anticoagulation clinic today.   Discussed rationale for anticoagulation in patients with atrial fibrillation as well as her individual stroke risk.  We compared stroke risk with bleeding risks of these medications to explain the benefit.  She also asked for an explination of why Eliquis so we discussed the difference in the medications.  She states she has had a repeat endoscopy last month and everything was normal.  She would like to try Xarelto or Pradaxa because the copay is significantly lower than with Eliquis ($30/mo verus $138/mo).  Given her repeat GI evaluation was clear, will change to Xarelto.    Reviewed patients medication list.  Pt is not currently on any combined P-gp and strong CYP3A4 inhibitors/inducers (ketoconazole, traconazole, ritonavir, carbamazepine, phenytoin, rifampin, St. John's wort).  Reviewed labs.  SCr 0.8, Weight 61 kg, CrCl- 64 mL/min. Will dose her at 20 mg daily.    Pt is aware to take the medication once daily with the largest meal of the day.  Side effects of potential bleeding are discussed, including unusual colored urine or stools, coughing up blood or coffee ground emesis, nose bleeds or serious fall or head trauma.  Discussed signs and symptoms of stroke. The patient should avoid any OTC items containing aspirin or ibuprofen.  She does take IBU  a few times of the month for back pain.  Instructed to limit to as little as possible.  Avoid alcohol consumption.   Call if any signs of abnormal bleeding.  Discussed financial obligations and resolved any difficulty in obtaining medication.  Next lab test test in 1 month.

## 2016-02-06 DIAGNOSIS — E860 Dehydration: Secondary | ICD-10-CM | POA: Diagnosis not present

## 2016-02-06 DIAGNOSIS — R112 Nausea with vomiting, unspecified: Secondary | ICD-10-CM | POA: Diagnosis not present

## 2016-02-06 DIAGNOSIS — A09 Infectious gastroenteritis and colitis, unspecified: Secondary | ICD-10-CM | POA: Diagnosis not present

## 2016-02-06 DIAGNOSIS — R109 Unspecified abdominal pain: Secondary | ICD-10-CM | POA: Diagnosis not present

## 2016-02-06 DIAGNOSIS — R000 Tachycardia, unspecified: Secondary | ICD-10-CM | POA: Diagnosis not present

## 2016-02-06 DIAGNOSIS — R197 Diarrhea, unspecified: Secondary | ICD-10-CM | POA: Diagnosis not present

## 2016-02-08 DIAGNOSIS — I4891 Unspecified atrial fibrillation: Secondary | ICD-10-CM | POA: Diagnosis not present

## 2016-02-08 DIAGNOSIS — Z6824 Body mass index (BMI) 24.0-24.9, adult: Secondary | ICD-10-CM | POA: Diagnosis not present

## 2016-02-08 DIAGNOSIS — R159 Full incontinence of feces: Secondary | ICD-10-CM | POA: Diagnosis not present

## 2016-02-08 DIAGNOSIS — A09 Infectious gastroenteritis and colitis, unspecified: Secondary | ICD-10-CM | POA: Diagnosis not present

## 2016-02-15 DIAGNOSIS — R159 Full incontinence of feces: Secondary | ICD-10-CM | POA: Diagnosis not present

## 2016-02-15 DIAGNOSIS — Z6824 Body mass index (BMI) 24.0-24.9, adult: Secondary | ICD-10-CM | POA: Diagnosis not present

## 2016-02-15 DIAGNOSIS — R197 Diarrhea, unspecified: Secondary | ICD-10-CM | POA: Diagnosis not present

## 2016-02-16 DIAGNOSIS — R197 Diarrhea, unspecified: Secondary | ICD-10-CM | POA: Diagnosis not present

## 2016-03-02 ENCOUNTER — Ambulatory Visit (INDEPENDENT_AMBULATORY_CARE_PROVIDER_SITE_OTHER): Payer: Medicare Other | Admitting: Pharmacist

## 2016-03-02 VITALS — Wt 137.8 lb

## 2016-03-02 DIAGNOSIS — I4891 Unspecified atrial fibrillation: Secondary | ICD-10-CM

## 2016-03-02 LAB — BASIC METABOLIC PANEL
BUN: 18 mg/dL (ref 7–25)
CALCIUM: 9.1 mg/dL (ref 8.6–10.4)
CO2: 24 mmol/L (ref 20–31)
Chloride: 104 mmol/L (ref 98–110)
Creat: 1.03 mg/dL — ABNORMAL HIGH (ref 0.50–0.99)
GLUCOSE: 142 mg/dL — AB (ref 65–99)
Potassium: 4 mmol/L (ref 3.5–5.3)
SODIUM: 138 mmol/L (ref 135–146)

## 2016-03-02 LAB — CBC
HCT: 41.9 % (ref 36.0–46.0)
Hemoglobin: 14 g/dL (ref 12.0–15.0)
MCH: 30.5 pg (ref 26.0–34.0)
MCHC: 33.4 g/dL (ref 30.0–36.0)
MCV: 91.3 fL (ref 78.0–100.0)
MPV: 11.1 fL (ref 8.6–12.4)
PLATELETS: 272 10*3/uL (ref 150–400)
RBC: 4.59 MIL/uL (ref 3.87–5.11)
RDW: 13.6 % (ref 11.5–15.5)
WBC: 8.6 10*3/uL (ref 4.0–10.5)

## 2016-03-02 NOTE — Progress Notes (Signed)
Pt was started on Eliquis on 01/17/16 and switched to Xarelto on 01/25/16 due to cost. She currently takes Xarelto 20mg  daily for atrial fibrillation.  Reviewed patients medication list.  Pt is not currently on any combined P-gp and strong CYP3A4 inhibitors/inducers (ketoconazole, traconazole, ritonavir, carbamazepine, phenytoin, rifampin, St. John's wort).  Reviewed labs.  SCr 1.03, Weight 137 lbs, CrCl 51.  Dose appropriate based on CrCl.   Hgb and HCT WNL at 14 and 41.9, respectively.  A full discussion of the nature of anticoagulants has been carried out.  A benefit/risk analysis has been presented to the patient, so that they understand the justification for choosing anticoagulation with Xarelto at this time.  The need for compliance is stressed.  Pt is aware to take the medication once daily with the largest meal of the day.  Side effects of potential bleeding are discussed, including unusual colored urine or stools, coughing up blood or coffee ground emesis, nose bleeds or serious fall or head trauma.  Discussed signs and symptoms of stroke. The patient should avoid any OTC items containing aspirin or ibuprofen.  Avoid alcohol consumption.   Call if any signs of abnormal bleeding.  Discussed financial obligations and resolved any difficulty in obtaining medication. Provided patient with Xarelto 30 day free card. Next lab test test in 3 months since CrCl borderline to dose reduction.   Discussed lab results with patient - will recheck labs in 3 months instead of 6 given borderline CrCl.

## 2016-03-06 DIAGNOSIS — K529 Noninfective gastroenteritis and colitis, unspecified: Secondary | ICD-10-CM | POA: Diagnosis not present

## 2016-03-13 DIAGNOSIS — K219 Gastro-esophageal reflux disease without esophagitis: Secondary | ICD-10-CM | POA: Diagnosis not present

## 2016-03-13 DIAGNOSIS — K745 Biliary cirrhosis, unspecified: Secondary | ICD-10-CM | POA: Diagnosis not present

## 2016-04-10 DIAGNOSIS — L578 Other skin changes due to chronic exposure to nonionizing radiation: Secondary | ICD-10-CM | POA: Diagnosis not present

## 2016-04-10 DIAGNOSIS — B009 Herpesviral infection, unspecified: Secondary | ICD-10-CM | POA: Diagnosis not present

## 2016-04-10 DIAGNOSIS — L821 Other seborrheic keratosis: Secondary | ICD-10-CM | POA: Diagnosis not present

## 2016-04-12 ENCOUNTER — Telehealth: Payer: Self-pay | Admitting: Internal Medicine

## 2016-04-12 NOTE — Telephone Encounter (Signed)
New message      Pt c/o medication issue:  1. Name of Medication: xarelto 2. How are you currently taking this medication (dosage and times per day)? 20mg  daily  3. Are you having a reaction (difficulty breathing--STAT)? no  4. What is your medication issue? Pt just found out that she should not be taking fish oil and blood thinner.  She is taking 3 fish oil tablets daily----1000mg  of fish oil and 300mg  of omega 3 per tablet and she takes 3 pills each.  Should she stop taking this medication?

## 2016-04-12 NOTE — Telephone Encounter (Signed)
Spoke with pt.  She has been taking fish oil for years. Her last TG in October 2016 were normal.  Told her fish oil may increase her risk of bleeding slightly.  She is having blood work repeated next week.  If TG are normal, suggest she stop the fish oil.  If they are elevated, she should continue the fish oil.

## 2016-04-18 ENCOUNTER — Encounter: Payer: Self-pay | Admitting: Internal Medicine

## 2016-04-18 DIAGNOSIS — N183 Chronic kidney disease, stage 3 (moderate): Secondary | ICD-10-CM | POA: Diagnosis not present

## 2016-04-18 DIAGNOSIS — I129 Hypertensive chronic kidney disease with stage 1 through stage 4 chronic kidney disease, or unspecified chronic kidney disease: Secondary | ICD-10-CM | POA: Diagnosis not present

## 2016-04-18 DIAGNOSIS — E782 Mixed hyperlipidemia: Secondary | ICD-10-CM | POA: Diagnosis not present

## 2016-04-26 DIAGNOSIS — Z139 Encounter for screening, unspecified: Secondary | ICD-10-CM | POA: Diagnosis not present

## 2016-04-26 DIAGNOSIS — R197 Diarrhea, unspecified: Secondary | ICD-10-CM | POA: Diagnosis not present

## 2016-04-26 DIAGNOSIS — N183 Chronic kidney disease, stage 3 (moderate): Secondary | ICD-10-CM | POA: Diagnosis not present

## 2016-04-26 DIAGNOSIS — Z0000 Encounter for general adult medical examination without abnormal findings: Secondary | ICD-10-CM | POA: Diagnosis not present

## 2016-04-26 DIAGNOSIS — Z9181 History of falling: Secondary | ICD-10-CM | POA: Diagnosis not present

## 2016-04-26 DIAGNOSIS — I129 Hypertensive chronic kidney disease with stage 1 through stage 4 chronic kidney disease, or unspecified chronic kidney disease: Secondary | ICD-10-CM | POA: Diagnosis not present

## 2016-04-26 DIAGNOSIS — Z1389 Encounter for screening for other disorder: Secondary | ICD-10-CM | POA: Diagnosis not present

## 2016-04-26 DIAGNOSIS — E782 Mixed hyperlipidemia: Secondary | ICD-10-CM | POA: Diagnosis not present

## 2016-05-02 ENCOUNTER — Telehealth: Payer: Self-pay | Admitting: Internal Medicine

## 2016-05-02 NOTE — Telephone Encounter (Signed)
Left message for patient

## 2016-05-02 NOTE — Telephone Encounter (Signed)
New message   The pt went to their family doctor and the primary doctor said the pt was in A-Fib      Pt c/o medication issue:  1. Name of Medication: diltiazem  2. How are you currently taking this medication (dosage and times per day)? Pt did not provide dosage, pt takes once daily  3. Are you having a reaction (difficulty breathing--STAT)? no  4. What is your medication issue? The pt is concerned the medication is not working like it should. Cause now the pt states she is in A-fib

## 2016-05-07 NOTE — Telephone Encounter (Signed)
Follow-up    The pt is calling the nurse called back later in the day the pt missed the phone call, the pt has provided a new number, the pt really wants to speak with the nurse.

## 2016-05-08 NOTE — Telephone Encounter (Signed)
Spoke with patient and she feels good.  I let her know if she felt she needed to be seen I could get her an appointment.  She said she just wanted [place] to be aware as her PCP told her.  She really did not know but now that they brought to her attention she says every now and then, mostly at night, she feels "fluttering" in her chest. It is not racing and she says she feels good otherwise.  I let her know if she felt she needed to come in and be seen sooner to call me back.  She will do so and appreciated my return call.

## 2016-05-14 ENCOUNTER — Telehealth: Payer: Self-pay | Admitting: Internal Medicine

## 2016-05-14 DIAGNOSIS — Z803 Family history of malignant neoplasm of breast: Secondary | ICD-10-CM | POA: Diagnosis not present

## 2016-05-14 DIAGNOSIS — I252 Old myocardial infarction: Secondary | ICD-10-CM | POA: Diagnosis not present

## 2016-05-14 DIAGNOSIS — Z1231 Encounter for screening mammogram for malignant neoplasm of breast: Secondary | ICD-10-CM | POA: Diagnosis not present

## 2016-05-14 DIAGNOSIS — Z01419 Encounter for gynecological examination (general) (routine) without abnormal findings: Secondary | ICD-10-CM | POA: Insufficient documentation

## 2016-05-14 DIAGNOSIS — Z79890 Hormone replacement therapy (postmenopausal): Secondary | ICD-10-CM | POA: Insufficient documentation

## 2016-05-14 DIAGNOSIS — Z780 Asymptomatic menopausal state: Secondary | ICD-10-CM | POA: Diagnosis not present

## 2016-05-14 NOTE — Telephone Encounter (Signed)
Dr Rayann Heman reviewed and is okay with starting

## 2016-05-14 NOTE — Telephone Encounter (Signed)
New message      Calling to see if Dr Rayann Heman ok'd pt to take hormone replacement therapy?

## 2016-05-14 NOTE — Telephone Encounter (Signed)
There are no drug-drug interactions between estrogen and Xarelto.

## 2016-05-14 NOTE — Telephone Encounter (Signed)
New Message:   She just left her Gyn doctor.He wants to know if it is all right for her to take Estrogen with her blood thinner?

## 2016-05-14 NOTE — Telephone Encounter (Signed)
Spoke with patient and let her know.  

## 2016-05-15 NOTE — Telephone Encounter (Signed)
Spoke with Lilia Pro and aware okay to start

## 2016-05-16 ENCOUNTER — Telehealth: Payer: Self-pay | Admitting: Internal Medicine

## 2016-05-16 NOTE — Telephone Encounter (Signed)
New message       Returning nurse's call wanted to speak with Claiborne Billings

## 2016-05-16 NOTE — Telephone Encounter (Signed)
Pt called to let Claiborne Billings now that [place] with her GYN needs a letter saying that pt can start this medication. There are no contraindication  Taking      the Estrogen ( hormone replacement )medication, with the Blood thinner and that pt may start this medication. The letter needs to be faxed to [place] Fax # [phone]. Phone call notes of Elberta Leatherwood Pharmacist and Dr. Jackalyn Lombard were  FAXED TO Dr Jon Billings office ATT: Lilia Pro.

## 2016-06-11 ENCOUNTER — Ambulatory Visit (INDEPENDENT_AMBULATORY_CARE_PROVIDER_SITE_OTHER): Payer: Medicare Other

## 2016-06-11 DIAGNOSIS — Z5181 Encounter for therapeutic drug level monitoring: Secondary | ICD-10-CM

## 2016-06-11 DIAGNOSIS — I4891 Unspecified atrial fibrillation: Secondary | ICD-10-CM | POA: Diagnosis not present

## 2016-06-11 LAB — CBC
HEMATOCRIT: 43.8 % (ref 35.0–45.0)
HEMOGLOBIN: 14.9 g/dL (ref 11.7–15.5)
MCH: 30.9 pg (ref 27.0–33.0)
MCHC: 34 g/dL (ref 32.0–36.0)
MCV: 90.9 fL (ref 80.0–100.0)
MPV: 11.4 fL (ref 7.5–12.5)
Platelets: 268 10*3/uL (ref 140–400)
RBC: 4.82 MIL/uL (ref 3.80–5.10)
RDW: 13.6 % (ref 11.0–15.0)
WBC: 9.2 10*3/uL (ref 3.8–10.8)

## 2016-06-11 LAB — BASIC METABOLIC PANEL
BUN: 18 mg/dL (ref 7–25)
CO2: 20 mmol/L (ref 20–31)
Calcium: 9.6 mg/dL (ref 8.6–10.4)
Chloride: 106 mmol/L (ref 98–110)
Creat: 1.1 mg/dL — ABNORMAL HIGH (ref 0.60–0.93)
GLUCOSE: 93 mg/dL (ref 65–99)
POTASSIUM: 3.8 mmol/L (ref 3.5–5.3)
SODIUM: 137 mmol/L (ref 135–146)

## 2016-06-12 MED ORDER — RIVAROXABAN 15 MG PO TABS: 15.0000 mg | ORAL_TABLET | Freq: Every day | ORAL | Status: DC

## 2016-06-12 NOTE — Progress Notes (Signed)
Pt was started on Xarelto 20mg  for afib on 01/25/16 by Dr Rayann Heman.  This is a 3 month recheck secondary to borderline CrCl.  Reviewed patients medication list.  Pt is not currently on any combined P-gp and strong CYP3A4 inhibitors/inducers (ketoconazole, traconazole, ritonavir, carbamazepine, phenytoin, rifampin, St. John's wort).  Reviewed labs.  SCr 1.10, Weight 134 lbs, CrCl- 45.66.  Dose inappropriate based on CrCl.   Hgb and HCT Within Normal Limits 14.9/43.8 on 06/11/16.  Called spoke with pt advised due to decreased CrCl/kidney fx we need to decrease pt's dosage of Xarelto from 20mg  to 15mg  daily.  Pt verbalized understanding new rx sent to pharmacy electronically, pt is aware to stop taking Xarelto 20mg  and start taking Xarelto 15mg .  Pt is seeing Dr Rayann Heman in November for a 1 year follow-up, I have made a note on pt's recall appt that she will need repeat BMP and CBC for Xarelto follow-up at that time to make sure Xarelto dosing still appropriate.

## 2016-06-12 NOTE — Patient Instructions (Signed)

## 2016-07-11 DIAGNOSIS — H26493 Other secondary cataract, bilateral: Secondary | ICD-10-CM | POA: Diagnosis not present

## 2016-08-01 DIAGNOSIS — Z6823 Body mass index (BMI) 23.0-23.9, adult: Secondary | ICD-10-CM | POA: Diagnosis not present

## 2016-08-01 DIAGNOSIS — M549 Dorsalgia, unspecified: Secondary | ICD-10-CM | POA: Diagnosis not present

## 2016-08-09 DIAGNOSIS — R319 Hematuria, unspecified: Secondary | ICD-10-CM | POA: Diagnosis not present

## 2016-08-09 DIAGNOSIS — R109 Unspecified abdominal pain: Secondary | ICD-10-CM | POA: Diagnosis not present

## 2016-08-09 DIAGNOSIS — Z6823 Body mass index (BMI) 23.0-23.9, adult: Secondary | ICD-10-CM | POA: Diagnosis not present

## 2016-08-09 DIAGNOSIS — Z87442 Personal history of urinary calculi: Secondary | ICD-10-CM | POA: Diagnosis not present

## 2016-08-14 DIAGNOSIS — R109 Unspecified abdominal pain: Secondary | ICD-10-CM | POA: Diagnosis not present

## 2016-08-14 DIAGNOSIS — Z87442 Personal history of urinary calculi: Secondary | ICD-10-CM | POA: Diagnosis not present

## 2016-08-14 DIAGNOSIS — R319 Hematuria, unspecified: Secondary | ICD-10-CM | POA: Diagnosis not present

## 2016-09-04 DIAGNOSIS — Z23 Encounter for immunization: Secondary | ICD-10-CM | POA: Diagnosis not present

## 2016-09-17 DIAGNOSIS — R51 Headache: Secondary | ICD-10-CM | POA: Diagnosis present

## 2016-09-17 DIAGNOSIS — K529 Noninfective gastroenteritis and colitis, unspecified: Secondary | ICD-10-CM | POA: Diagnosis not present

## 2016-09-17 DIAGNOSIS — E860 Dehydration: Secondary | ICD-10-CM | POA: Diagnosis not present

## 2016-09-17 DIAGNOSIS — I252 Old myocardial infarction: Secondary | ICD-10-CM | POA: Diagnosis not present

## 2016-09-17 DIAGNOSIS — K5641 Fecal impaction: Secondary | ICD-10-CM | POA: Diagnosis not present

## 2016-09-17 DIAGNOSIS — R112 Nausea with vomiting, unspecified: Secondary | ICD-10-CM | POA: Diagnosis not present

## 2016-09-17 DIAGNOSIS — F17210 Nicotine dependence, cigarettes, uncomplicated: Secondary | ICD-10-CM | POA: Diagnosis present

## 2016-09-17 DIAGNOSIS — Z7902 Long term (current) use of antithrombotics/antiplatelets: Secondary | ICD-10-CM | POA: Diagnosis not present

## 2016-09-17 DIAGNOSIS — D72825 Bandemia: Secondary | ICD-10-CM | POA: Diagnosis not present

## 2016-09-17 DIAGNOSIS — K5900 Constipation, unspecified: Secondary | ICD-10-CM | POA: Diagnosis not present

## 2016-09-17 DIAGNOSIS — E7800 Pure hypercholesterolemia, unspecified: Secondary | ICD-10-CM | POA: Diagnosis present

## 2016-09-17 DIAGNOSIS — I10 Essential (primary) hypertension: Secondary | ICD-10-CM | POA: Diagnosis not present

## 2016-09-17 DIAGNOSIS — Z79899 Other long term (current) drug therapy: Secondary | ICD-10-CM | POA: Diagnosis not present

## 2016-09-17 DIAGNOSIS — N179 Acute kidney failure, unspecified: Secondary | ICD-10-CM | POA: Diagnosis not present

## 2016-09-17 DIAGNOSIS — R0789 Other chest pain: Secondary | ICD-10-CM | POA: Diagnosis not present

## 2016-09-17 DIAGNOSIS — D72829 Elevated white blood cell count, unspecified: Secondary | ICD-10-CM | POA: Diagnosis not present

## 2016-09-17 DIAGNOSIS — M1990 Unspecified osteoarthritis, unspecified site: Secondary | ICD-10-CM | POA: Diagnosis present

## 2016-09-17 DIAGNOSIS — K6389 Other specified diseases of intestine: Secondary | ICD-10-CM | POA: Diagnosis not present

## 2016-09-17 DIAGNOSIS — K862 Cyst of pancreas: Secondary | ICD-10-CM | POA: Diagnosis not present

## 2016-09-17 DIAGNOSIS — R1013 Epigastric pain: Secondary | ICD-10-CM | POA: Diagnosis not present

## 2016-09-17 DIAGNOSIS — I4891 Unspecified atrial fibrillation: Secondary | ICD-10-CM | POA: Diagnosis not present

## 2016-09-18 DIAGNOSIS — K5641 Fecal impaction: Secondary | ICD-10-CM

## 2016-09-18 DIAGNOSIS — D72829 Elevated white blood cell count, unspecified: Secondary | ICD-10-CM

## 2016-09-18 DIAGNOSIS — R112 Nausea with vomiting, unspecified: Secondary | ICD-10-CM

## 2016-09-24 DIAGNOSIS — K5641 Fecal impaction: Secondary | ICD-10-CM | POA: Diagnosis not present

## 2016-09-24 DIAGNOSIS — K754 Autoimmune hepatitis: Secondary | ICD-10-CM | POA: Diagnosis not present

## 2016-09-24 DIAGNOSIS — I4891 Unspecified atrial fibrillation: Secondary | ICD-10-CM | POA: Diagnosis not present

## 2016-09-24 DIAGNOSIS — K862 Cyst of pancreas: Secondary | ICD-10-CM | POA: Diagnosis not present

## 2016-10-01 DIAGNOSIS — K5900 Constipation, unspecified: Secondary | ICD-10-CM | POA: Diagnosis not present

## 2016-10-01 DIAGNOSIS — K869 Disease of pancreas, unspecified: Secondary | ICD-10-CM | POA: Diagnosis not present

## 2016-10-01 DIAGNOSIS — R935 Abnormal findings on diagnostic imaging of other abdominal regions, including retroperitoneum: Secondary | ICD-10-CM | POA: Diagnosis not present

## 2016-10-08 DIAGNOSIS — K862 Cyst of pancreas: Secondary | ICD-10-CM | POA: Diagnosis not present

## 2016-10-08 DIAGNOSIS — R935 Abnormal findings on diagnostic imaging of other abdominal regions, including retroperitoneum: Secondary | ICD-10-CM | POA: Diagnosis not present

## 2016-10-23 DIAGNOSIS — Z96649 Presence of unspecified artificial hip joint: Secondary | ICD-10-CM | POA: Diagnosis not present

## 2016-10-23 DIAGNOSIS — M5416 Radiculopathy, lumbar region: Secondary | ICD-10-CM | POA: Diagnosis not present

## 2016-10-29 ENCOUNTER — Encounter: Payer: Self-pay | Admitting: Internal Medicine

## 2016-10-29 ENCOUNTER — Ambulatory Visit (INDEPENDENT_AMBULATORY_CARE_PROVIDER_SITE_OTHER): Payer: Medicare Other | Admitting: Internal Medicine

## 2016-10-29 VITALS — BP 138/88 | HR 121 | Ht 63.0 in | Wt 133.8 lb

## 2016-10-29 DIAGNOSIS — I480 Paroxysmal atrial fibrillation: Secondary | ICD-10-CM

## 2016-10-29 DIAGNOSIS — I10 Essential (primary) hypertension: Secondary | ICD-10-CM | POA: Diagnosis not present

## 2016-10-29 MED ORDER — DILTIAZEM HCL ER COATED BEADS 240 MG PO CP24: 240.0000 mg | ORAL_CAPSULE | Freq: Every day | ORAL | 3 refills | Status: DC

## 2016-10-29 NOTE — Patient Instructions (Signed)

## 2016-10-29 NOTE — Progress Notes (Signed)
PCP: Marco Collie, MD  Abigail Sullivan is a 70 y.o. female who presents today for routine electrophysiology followup.  Since last being seen in our clinic, the patient reports doing very well.  Her afib is well controlled.  Her primary concern is with bilateral thumb discomfort.  She has had no further bleeding on anticoagulation.  Today, she denies symptoms of chest pain, shortness of breath,  lower extremity edema, dizziness, presyncope, or syncope.  The patient is otherwise without complaint today.   Past Medical History:  Diagnosis Date  . Atrial fibrillation (HCC)    post-operative   . Hepatitis   . Hyperlipidemia   . Hypertension   . Premature atrial contractions    systomatic PAC's and documented SVT ( likley atrial tachycardia )   Past Surgical History:  Procedure Laterality Date  . LUMBAR LAMINECTOMY/DECOMPRESSION MICRODISCECTOMY  02/04/2011   L2-L3 and L3-L4 with diskectomy and plating    Current Outpatient Prescriptions  Medication Sig Dispense Refill  . acyclovir (ZOVIRAX) 200 MG capsule Take 200 mg by mouth 2 (two) times daily.     . Calcium Carbonate-Vitamin D (CALCIUM + D) 600-200 MG-UNIT TABS Take 1 tablet by mouth daily.     Marland Kitchen CARTIA XT 240 MG 24 hr capsule TAKE ONE CAPSULE BY MOUTH EVERY DAY 90 capsule 3  . estradiol (ESTRACE) 1 MG tablet TAKE ONE TABLET BY MOUTH DAILY    . Lactobacillus (PROBIOTIC ACIDOPHILUS PO) Take 1 tablet by mouth daily.    Marland Kitchen losartan (COZAAR) 25 MG tablet Take 25 mg by mouth daily.    . medroxyPROGESTERone (PROVERA) 2.5 MG tablet Take 1 tablet by mouth daily.    . multivitamin (THERAGRAN) per tablet Take 1 tablet by mouth daily.      . nitroGLYCERIN (NITROSTAT) 0.4 MG SL tablet Place 1 tablet (0.4 mg total) under the tongue every 5 (five) minutes x 3 doses as needed for chest pain. If no relief after 3 doses, call 911 25 tablet 3  . Omega-3 Fatty Acids (FISH OIL PO) Take 3 capsules by mouth daily.      . Probiotic Product (PROBIOTIC PO) Take 1  capsule by mouth daily.    . ranitidine (ZANTAC) 150 MG tablet Take 150 mg by mouth at bedtime.    . Rivaroxaban (XARELTO) 15 MG TABS tablet Take 1 tablet (15 mg total) by mouth daily with supper. 30 tablet 6  . simvastatin (ZOCOR) 20 MG tablet Take 20 mg by mouth at bedtime.      . ursodiol (ACTIGALL) 300 MG capsule Take 300 mg by mouth 2 (two) times daily.       No current facility-administered medications for this visit.     Physical Exam: Vitals:   10/29/16 1007  BP: 138/88  Pulse: (!) 121  Weight: 133 lb 12.8 oz (60.7 kg)  Height: 5\' 3"  (1.6 m)    GEN- The patient is well appearing, alert and oriented x 3 today.   Head- normocephalic, atraumatic Eyes-  Sclera clear, conjunctiva pink Ears- hearing intact Oropharynx- clear Lungs- Clear to ausculation bilaterally, normal work of breathing Heart- Regular rate and rhythm, no murmurs, rubs or gallops, PMI not laterally displaced GI- soft, NT, ND, + BS Extremities- no clubbing, cyanosis, or edema  ekg today reveals sinus tachycardia  Assessment and Plan:  1. afib Well controlled CHADS2VASC score is 3.  Doing well with xarelto.  She is considering switching to eliquis due to finances in the next few months.  If she decides to  switch, she should contact our office.  Her dose would be eliquis 5mg  BID based on labs from 7/17  2. HTN Stable No change required today  3. Tobacco Cessation advised.  She is clear that she will not quit  4. Prior NSTEMI with GI bleed 2016 Lexiscan myoview reviewed and was low risk.  No further symptoms.  No further workup at this time.  Return in 1 year  Thompson Grayer MD, [facility] 10/29/2016 10:48 AM

## 2016-11-06 DIAGNOSIS — I129 Hypertensive chronic kidney disease with stage 1 through stage 4 chronic kidney disease, or unspecified chronic kidney disease: Secondary | ICD-10-CM | POA: Diagnosis not present

## 2016-11-06 DIAGNOSIS — N183 Chronic kidney disease, stage 3 (moderate): Secondary | ICD-10-CM | POA: Diagnosis not present

## 2016-11-06 DIAGNOSIS — E782 Mixed hyperlipidemia: Secondary | ICD-10-CM | POA: Diagnosis not present

## 2016-11-15 DIAGNOSIS — E875 Hyperkalemia: Secondary | ICD-10-CM | POA: Diagnosis not present

## 2016-12-11 ENCOUNTER — Telehealth: Payer: Self-pay | Admitting: Internal Medicine

## 2016-12-11 NOTE — Telephone Encounter (Signed)
New Message   Pt was taking xeralto, and was told to call back and  Let Doctor know when she was ready to switch to Eloquis, and a prescription could be sent over. Would like a call back.

## 2016-12-12 MED ORDER — APIXABAN 5 MG PO TABS: 5.0000 mg | ORAL_TABLET | Freq: Two times a day (BID) | ORAL | 11 refills | Status: DC

## 2016-12-12 NOTE — Telephone Encounter (Signed)
1. afib Well controlled CHADS2VASC score is 3.  Doing well with xarelto.  She is considering switching to eliquis due to finances in the next few months.  If she decides to switch, she should contact our office.  Her dose would be eliquis 5mg  BID based on labs from 7/17   Spoke with patient and have called in medication

## 2016-12-17 DIAGNOSIS — N182 Chronic kidney disease, stage 2 (mild): Secondary | ICD-10-CM | POA: Diagnosis not present

## 2016-12-17 DIAGNOSIS — I4891 Unspecified atrial fibrillation: Secondary | ICD-10-CM | POA: Diagnosis not present

## 2016-12-17 DIAGNOSIS — I129 Hypertensive chronic kidney disease with stage 1 through stage 4 chronic kidney disease, or unspecified chronic kidney disease: Secondary | ICD-10-CM | POA: Diagnosis not present

## 2016-12-17 DIAGNOSIS — I2510 Atherosclerotic heart disease of native coronary artery without angina pectoris: Secondary | ICD-10-CM | POA: Diagnosis not present

## 2016-12-17 DIAGNOSIS — E782 Mixed hyperlipidemia: Secondary | ICD-10-CM | POA: Diagnosis not present

## 2016-12-18 ENCOUNTER — Telehealth: Payer: Self-pay | Admitting: Internal Medicine

## 2016-12-18 NOTE — Telephone Encounter (Signed)
Called pt back and advised her that we are comfortable starting her on the Eliquis 5mg  BID dosing. Scheduled pt for 1 month follow up BMET and CBC in anticoag clinic. No further questions.

## 2016-12-18 NOTE — Telephone Encounter (Signed)
Patient is calling because she has additional questions since her conversation on 1/9 about switching from xarelto to eiliquis. The patient states that she has 2-3 weeks supply of xarelto that she is going to finish and then she wants to start the eliquis. She wants to know if she needs to have her labs drawn prior to starting the eliquis 5 mg BID that was based on her labs from 06/2016. She states that it seemed like a long time ago to be basing her dosage on. She also wants to know if she needs to come in sometime after she starts the eliquis to have labs drawn as well.

## 2016-12-18 NOTE — Telephone Encounter (Signed)
New Message     Has question about the medication change for Xarelto to Eliquis?  Does she have to come back in for blood work to have blood levels?

## 2017-01-02 ENCOUNTER — Encounter: Payer: Self-pay | Admitting: Cardiovascular Disease

## 2017-01-02 NOTE — Progress Notes (Signed)
This encounter was created in error - please disregard.

## 2017-01-29 ENCOUNTER — Telehealth: Payer: Self-pay | Admitting: Internal Medicine

## 2017-01-29 NOTE — Telephone Encounter (Signed)
New Message ° °Pt call requesting to speak with RN. Pt did not want to disclose any further information. Please call back to discuss  °

## 2017-01-29 NOTE — Telephone Encounter (Signed)
Returned call to patient and she is calling because she is having labs tomorrow for Eliquis and wants to make sure she is talking to someone about her sleepless nights and extreme headaches. I let her know she needed to call her PCP also

## 2017-01-30 ENCOUNTER — Ambulatory Visit (INDEPENDENT_AMBULATORY_CARE_PROVIDER_SITE_OTHER): Payer: Medicare Other | Admitting: *Deleted

## 2017-01-30 DIAGNOSIS — I482 Chronic atrial fibrillation, unspecified: Secondary | ICD-10-CM

## 2017-01-30 LAB — CBC
HEMATOCRIT: 41.7 % (ref 34.0–46.6)
Hemoglobin: 14.1 g/dL (ref 11.1–15.9)
MCH: 30.9 pg (ref 26.6–33.0)
MCHC: 33.8 g/dL (ref 31.5–35.7)
MCV: 91 fL (ref 79–97)
PLATELETS: 262 10*3/uL (ref 150–379)
RBC: 4.56 x10E6/uL (ref 3.77–5.28)
RDW: 13.1 % (ref 12.3–15.4)
WBC: 9.5 10*3/uL (ref 3.4–10.8)

## 2017-01-30 LAB — BASIC METABOLIC PANEL
BUN/Creatinine Ratio: 18 (ref 12–28)
BUN: 20 mg/dL (ref 8–27)
CALCIUM: 10 mg/dL (ref 8.7–10.3)
CHLORIDE: 99 mmol/L (ref 96–106)
CO2: 21 mmol/L (ref 18–29)
Creatinine, Ser: 1.09 mg/dL — ABNORMAL HIGH (ref 0.57–1.00)
GFR calc Af Amer: 59 mL/min/{1.73_m2} — ABNORMAL LOW (ref >59)
GFR, EST NON AFRICAN AMERICAN: 52 mL/min/{1.73_m2} — AB (ref >59)
GLUCOSE: 125 mg/dL — AB (ref 65–99)
POTASSIUM: 4.2 mmol/L (ref 3.5–5.2)
SODIUM: 140 mmol/L (ref 134–144)

## 2017-01-30 MED ORDER — APIXABAN 5 MG PO TABS: 5.0000 mg | ORAL_TABLET | Freq: Two times a day (BID) | ORAL | 1 refills | Status: DC

## 2017-01-30 NOTE — Progress Notes (Signed)
Pt was started on Eliquis 5mg  q 12 hours  for Atrial Fib on approximately January 28th 2018.    Reviewed patients medication list.  Pt is not currently on any combined P-gp and strong CYP3A4 inhibitors/inducers (ketoconazole, traconazole, ritonavir, carbamazepine, phenytoin, rifampin, St. John's wort).  Reviewed labs.  SCr 1.09 , Weight 60.5kg,.  Dose is appropriate based on age, weight, and SCr.  Hgb 14.1 and HCT 41.7  A full discussion of the nature of anticoagulants has been carried out.  A benefit/risk analysis has been presented to the patient, so that they understand the justification for choosing anticoagulation with Eliquis at this time Pt had been on Xarelto and cost became too much.  The need for compliance is stressed.  Pt is aware to take the medication twice daily.  Side effects of potential bleeding are discussed, including unusual colored urine or stools, coughing up blood or coffee ground emesis, nose bleeds or serious fall or head trauma.  Discussed signs and symptoms of stroke. The patient should avoid any OTC items containing aspirin or ibuprofen.  Avoid alcohol consumption.   Call if any signs of abnormal bleeding.  Discussed financial obligations and  not having  any difficulty in obtaining medication.  Next lab test in 6 months. Pt states she has not missed any doses  of Eliquis and no sign or symptom of bleeding no sign or symptom of stroke but does state has had severe headaches that have become worse over the last month and that she is not able to sleep through the night Pt states she does take 3 Alleve in am prn and that does stop headache Cautioned with use of Alleve and Eliquis Will obtain CBC and BMET today and will call with results  Pt did speak with [place] PharmD regarding concerns about her headache and taking Fish oil and pt states she is seeing PCP tomorrow as well Spoke with pt and instructed that so she is on the correct dose Eliquis 5mg  q 12 hours and to recheck  labs in 6 months and appt made for recheck in August 2018 Also refill done of Eliquis 3 month supply as requested

## 2017-01-31 DIAGNOSIS — G479 Sleep disorder, unspecified: Secondary | ICD-10-CM | POA: Diagnosis not present

## 2017-01-31 DIAGNOSIS — Z6823 Body mass index (BMI) 23.0-23.9, adult: Secondary | ICD-10-CM | POA: Diagnosis not present

## 2017-02-15 DIAGNOSIS — G4700 Insomnia, unspecified: Secondary | ICD-10-CM | POA: Diagnosis not present

## 2017-02-15 DIAGNOSIS — Z6823 Body mass index (BMI) 23.0-23.9, adult: Secondary | ICD-10-CM | POA: Diagnosis not present

## 2017-03-13 DIAGNOSIS — G4700 Insomnia, unspecified: Secondary | ICD-10-CM | POA: Diagnosis not present

## 2017-03-13 DIAGNOSIS — I2510 Atherosclerotic heart disease of native coronary artery without angina pectoris: Secondary | ICD-10-CM | POA: Diagnosis not present

## 2017-03-13 DIAGNOSIS — Z6823 Body mass index (BMI) 23.0-23.9, adult: Secondary | ICD-10-CM | POA: Diagnosis not present

## 2017-03-13 DIAGNOSIS — F3110 Bipolar disorder, current episode manic without psychotic features, unspecified: Secondary | ICD-10-CM | POA: Diagnosis not present

## 2017-03-20 DIAGNOSIS — K745 Biliary cirrhosis, unspecified: Secondary | ICD-10-CM | POA: Diagnosis not present

## 2017-03-27 DIAGNOSIS — I129 Hypertensive chronic kidney disease with stage 1 through stage 4 chronic kidney disease, or unspecified chronic kidney disease: Secondary | ICD-10-CM | POA: Diagnosis not present

## 2017-03-27 DIAGNOSIS — G4700 Insomnia, unspecified: Secondary | ICD-10-CM | POA: Diagnosis not present

## 2017-03-27 DIAGNOSIS — F3110 Bipolar disorder, current episode manic without psychotic features, unspecified: Secondary | ICD-10-CM | POA: Diagnosis not present

## 2017-03-27 DIAGNOSIS — N183 Chronic kidney disease, stage 3 (moderate): Secondary | ICD-10-CM | POA: Diagnosis not present

## 2017-04-16 DIAGNOSIS — B009 Herpesviral infection, unspecified: Secondary | ICD-10-CM | POA: Diagnosis not present

## 2017-04-16 DIAGNOSIS — L578 Other skin changes due to chronic exposure to nonionizing radiation: Secondary | ICD-10-CM | POA: Diagnosis not present

## 2017-04-25 DIAGNOSIS — Z789 Other specified health status: Secondary | ICD-10-CM | POA: Diagnosis not present

## 2017-04-25 DIAGNOSIS — F3110 Bipolar disorder, current episode manic without psychotic features, unspecified: Secondary | ICD-10-CM | POA: Diagnosis not present

## 2017-05-03 DIAGNOSIS — Z139 Encounter for screening, unspecified: Secondary | ICD-10-CM | POA: Diagnosis not present

## 2017-05-03 DIAGNOSIS — Z1389 Encounter for screening for other disorder: Secondary | ICD-10-CM | POA: Diagnosis not present

## 2017-05-03 DIAGNOSIS — R42 Dizziness and giddiness: Secondary | ICD-10-CM | POA: Diagnosis not present

## 2017-05-03 DIAGNOSIS — Z0000 Encounter for general adult medical examination without abnormal findings: Secondary | ICD-10-CM | POA: Diagnosis not present

## 2017-05-03 DIAGNOSIS — F3110 Bipolar disorder, current episode manic without psychotic features, unspecified: Secondary | ICD-10-CM | POA: Diagnosis not present

## 2017-06-04 DIAGNOSIS — Z6823 Body mass index (BMI) 23.0-23.9, adult: Secondary | ICD-10-CM | POA: Diagnosis not present

## 2017-06-04 DIAGNOSIS — F3110 Bipolar disorder, current episode manic without psychotic features, unspecified: Secondary | ICD-10-CM | POA: Diagnosis not present

## 2017-06-04 DIAGNOSIS — Z789 Other specified health status: Secondary | ICD-10-CM | POA: Diagnosis not present

## 2017-06-04 DIAGNOSIS — R42 Dizziness and giddiness: Secondary | ICD-10-CM | POA: Diagnosis not present

## 2017-06-06 DIAGNOSIS — H6120 Impacted cerumen, unspecified ear: Secondary | ICD-10-CM | POA: Diagnosis not present

## 2017-06-06 DIAGNOSIS — F3110 Bipolar disorder, current episode manic without psychotic features, unspecified: Secondary | ICD-10-CM | POA: Diagnosis not present

## 2017-06-06 DIAGNOSIS — Z6823 Body mass index (BMI) 23.0-23.9, adult: Secondary | ICD-10-CM | POA: Diagnosis not present

## 2017-06-17 DIAGNOSIS — E782 Mixed hyperlipidemia: Secondary | ICD-10-CM | POA: Diagnosis not present

## 2017-06-17 DIAGNOSIS — I129 Hypertensive chronic kidney disease with stage 1 through stage 4 chronic kidney disease, or unspecified chronic kidney disease: Secondary | ICD-10-CM | POA: Diagnosis not present

## 2017-06-24 DIAGNOSIS — M79609 Pain in unspecified limb: Secondary | ICD-10-CM | POA: Diagnosis not present

## 2017-06-24 DIAGNOSIS — I10 Essential (primary) hypertension: Secondary | ICD-10-CM | POA: Diagnosis not present

## 2017-06-24 DIAGNOSIS — F3110 Bipolar disorder, current episode manic without psychotic features, unspecified: Secondary | ICD-10-CM | POA: Diagnosis not present

## 2017-06-24 DIAGNOSIS — F3160 Bipolar disorder, current episode mixed, unspecified: Secondary | ICD-10-CM | POA: Diagnosis not present

## 2017-06-24 DIAGNOSIS — Z6823 Body mass index (BMI) 23.0-23.9, adult: Secondary | ICD-10-CM | POA: Diagnosis not present

## 2017-06-24 DIAGNOSIS — Z1389 Encounter for screening for other disorder: Secondary | ICD-10-CM | POA: Diagnosis not present

## 2017-06-24 DIAGNOSIS — Z139 Encounter for screening, unspecified: Secondary | ICD-10-CM | POA: Diagnosis not present

## 2017-06-28 ENCOUNTER — Other Ambulatory Visit: Payer: Self-pay

## 2017-07-16 DIAGNOSIS — H26493 Other secondary cataract, bilateral: Secondary | ICD-10-CM | POA: Diagnosis not present

## 2017-07-16 DIAGNOSIS — H04123 Dry eye syndrome of bilateral lacrimal glands: Secondary | ICD-10-CM | POA: Diagnosis not present

## 2017-07-18 DIAGNOSIS — Z6823 Body mass index (BMI) 23.0-23.9, adult: Secondary | ICD-10-CM | POA: Diagnosis not present

## 2017-07-18 DIAGNOSIS — Z789 Other specified health status: Secondary | ICD-10-CM | POA: Diagnosis not present

## 2017-07-18 DIAGNOSIS — F3110 Bipolar disorder, current episode manic without psychotic features, unspecified: Secondary | ICD-10-CM | POA: Diagnosis not present

## 2017-07-30 ENCOUNTER — Ambulatory Visit (INDEPENDENT_AMBULATORY_CARE_PROVIDER_SITE_OTHER): Payer: Medicare Other | Admitting: *Deleted

## 2017-07-30 DIAGNOSIS — I482 Chronic atrial fibrillation, unspecified: Secondary | ICD-10-CM

## 2017-07-30 LAB — BASIC METABOLIC PANEL
BUN / CREAT RATIO: 12 (ref 12–28)
BUN: 11 mg/dL (ref 8–27)
CO2: 18 mmol/L — ABNORMAL LOW (ref 20–29)
CREATININE: 0.91 mg/dL (ref 0.57–1.00)
Calcium: 9.4 mg/dL (ref 8.7–10.3)
Chloride: 104 mmol/L (ref 96–106)
GFR, EST AFRICAN AMERICAN: 73 mL/min/{1.73_m2} (ref >59)
GFR, EST NON AFRICAN AMERICAN: 64 mL/min/{1.73_m2} (ref >59)
Glucose: 91 mg/dL (ref 65–99)
Potassium: 4.6 mmol/L (ref 3.5–5.2)
SODIUM: 140 mmol/L (ref 134–144)

## 2017-07-30 LAB — CBC
HEMATOCRIT: 43.4 % (ref 34.0–46.6)
Hemoglobin: 14.5 g/dL (ref 11.1–15.9)
MCH: 29.9 pg (ref 26.6–33.0)
MCHC: 33.4 g/dL (ref 31.5–35.7)
MCV: 90 fL (ref 79–97)
Platelets: 242 10*3/uL (ref 150–379)
RBC: 4.85 x10E6/uL (ref 3.77–5.28)
RDW: 13 % (ref 12.3–15.4)
WBC: 11 10*3/uL — ABNORMAL HIGH (ref 3.4–10.8)

## 2017-07-30 NOTE — Progress Notes (Signed)
Pt was started on Eliquis 5mg  BID for Afib on January 20th, 2018.    Reviewed patients medication list.  Pt is not currently on any combined P-gp and strong CYP3A4 inhibitors/inducers (ketoconazole, traconazole, ritonavir, carbamazepine, phenytoin, rifampin, St. John's wort).  Reviewed labs:  SCr-0.91, Hgb-14.5, HCT-43.4, Weight-60.8kg.  Dose appropriate based on age, weight, and SCr.  Hgb and HCT within normal limits.   A full discussion of the nature of anticoagulants has been carried out.  A benefit/risk analysis has been presented to the patient, so that they understand the justification for choosing anticoagulation with Eliquis at this time.  The need for compliance is stressed.  Pt is aware to take the medication twice daily.  Side effects of potential bleeding are discussed, including unusual colored urine or stools, coughing up blood or coffee ground emesis, nose bleeds or serious fall or head trauma.  Discussed signs and symptoms of stroke. The patient should avoid any OTC items containing aspirin or ibuprofen.  Avoid alcohol consumption.   Call if any signs of abnormal bleeding.  Discussed financial obligations and resolved any difficulty in obtaining medication.  Next lab test in 6 months.   07/30/17: Labs drawn & resulted today; Spoke with the pt & instructed pt to continue taking current dose of Eliquis 5mg  twice a day & to call with any issues or questions & she verbalized understanding. Appt set for 6 months with pt over the phone.

## 2017-08-01 ENCOUNTER — Other Ambulatory Visit: Payer: Self-pay | Admitting: Internal Medicine

## 2017-08-08 DIAGNOSIS — Z6822 Body mass index (BMI) 22.0-22.9, adult: Secondary | ICD-10-CM | POA: Diagnosis not present

## 2017-08-08 DIAGNOSIS — Z7189 Other specified counseling: Secondary | ICD-10-CM | POA: Diagnosis not present

## 2017-08-08 DIAGNOSIS — F3110 Bipolar disorder, current episode manic without psychotic features, unspecified: Secondary | ICD-10-CM | POA: Diagnosis not present

## 2017-08-19 DIAGNOSIS — I639 Cerebral infarction, unspecified: Secondary | ICD-10-CM | POA: Diagnosis not present

## 2017-08-19 DIAGNOSIS — G9389 Other specified disorders of brain: Secondary | ICD-10-CM | POA: Diagnosis not present

## 2017-08-19 DIAGNOSIS — R42 Dizziness and giddiness: Secondary | ICD-10-CM | POA: Diagnosis not present

## 2017-08-22 DIAGNOSIS — Z6822 Body mass index (BMI) 22.0-22.9, adult: Secondary | ICD-10-CM | POA: Diagnosis not present

## 2017-08-22 DIAGNOSIS — Z139 Encounter for screening, unspecified: Secondary | ICD-10-CM | POA: Diagnosis not present

## 2017-08-22 DIAGNOSIS — I638 Other cerebral infarction: Secondary | ICD-10-CM | POA: Diagnosis not present

## 2017-08-22 DIAGNOSIS — Z7189 Other specified counseling: Secondary | ICD-10-CM | POA: Diagnosis not present

## 2017-08-22 DIAGNOSIS — Z789 Other specified health status: Secondary | ICD-10-CM | POA: Diagnosis not present

## 2017-08-27 DIAGNOSIS — R262 Difficulty in walking, not elsewhere classified: Secondary | ICD-10-CM | POA: Diagnosis not present

## 2017-08-27 DIAGNOSIS — M62562 Muscle wasting and atrophy, not elsewhere classified, left lower leg: Secondary | ICD-10-CM | POA: Diagnosis not present

## 2017-08-27 DIAGNOSIS — I639 Cerebral infarction, unspecified: Secondary | ICD-10-CM | POA: Diagnosis not present

## 2017-08-27 DIAGNOSIS — M62452 Contracture of muscle, left thigh: Secondary | ICD-10-CM | POA: Diagnosis not present

## 2017-08-28 DIAGNOSIS — R262 Difficulty in walking, not elsewhere classified: Secondary | ICD-10-CM | POA: Diagnosis not present

## 2017-08-28 DIAGNOSIS — I639 Cerebral infarction, unspecified: Secondary | ICD-10-CM | POA: Diagnosis not present

## 2017-08-28 DIAGNOSIS — M62452 Contracture of muscle, left thigh: Secondary | ICD-10-CM | POA: Diagnosis not present

## 2017-08-28 DIAGNOSIS — M62562 Muscle wasting and atrophy, not elsewhere classified, left lower leg: Secondary | ICD-10-CM | POA: Diagnosis not present

## 2017-09-02 DIAGNOSIS — I6523 Occlusion and stenosis of bilateral carotid arteries: Secondary | ICD-10-CM | POA: Diagnosis not present

## 2017-09-03 DIAGNOSIS — Z23 Encounter for immunization: Secondary | ICD-10-CM | POA: Diagnosis not present

## 2017-09-03 DIAGNOSIS — I639 Cerebral infarction, unspecified: Secondary | ICD-10-CM | POA: Diagnosis not present

## 2017-09-03 DIAGNOSIS — R262 Difficulty in walking, not elsewhere classified: Secondary | ICD-10-CM | POA: Diagnosis not present

## 2017-09-03 DIAGNOSIS — M62452 Contracture of muscle, left thigh: Secondary | ICD-10-CM | POA: Diagnosis not present

## 2017-09-03 DIAGNOSIS — M62562 Muscle wasting and atrophy, not elsewhere classified, left lower leg: Secondary | ICD-10-CM | POA: Diagnosis not present

## 2017-09-05 DIAGNOSIS — I639 Cerebral infarction, unspecified: Secondary | ICD-10-CM | POA: Diagnosis not present

## 2017-09-05 DIAGNOSIS — M62562 Muscle wasting and atrophy, not elsewhere classified, left lower leg: Secondary | ICD-10-CM | POA: Diagnosis not present

## 2017-09-05 DIAGNOSIS — M62452 Contracture of muscle, left thigh: Secondary | ICD-10-CM | POA: Diagnosis not present

## 2017-09-05 DIAGNOSIS — R262 Difficulty in walking, not elsewhere classified: Secondary | ICD-10-CM | POA: Diagnosis not present

## 2017-09-09 DIAGNOSIS — M62452 Contracture of muscle, left thigh: Secondary | ICD-10-CM | POA: Diagnosis not present

## 2017-09-09 DIAGNOSIS — M62562 Muscle wasting and atrophy, not elsewhere classified, left lower leg: Secondary | ICD-10-CM | POA: Diagnosis not present

## 2017-09-09 DIAGNOSIS — R262 Difficulty in walking, not elsewhere classified: Secondary | ICD-10-CM | POA: Diagnosis not present

## 2017-09-09 DIAGNOSIS — I639 Cerebral infarction, unspecified: Secondary | ICD-10-CM | POA: Diagnosis not present

## 2017-09-10 DIAGNOSIS — M62562 Muscle wasting and atrophy, not elsewhere classified, left lower leg: Secondary | ICD-10-CM | POA: Diagnosis not present

## 2017-09-10 DIAGNOSIS — R262 Difficulty in walking, not elsewhere classified: Secondary | ICD-10-CM | POA: Diagnosis not present

## 2017-09-10 DIAGNOSIS — I639 Cerebral infarction, unspecified: Secondary | ICD-10-CM | POA: Diagnosis not present

## 2017-09-10 DIAGNOSIS — M62452 Contracture of muscle, left thigh: Secondary | ICD-10-CM | POA: Diagnosis not present

## 2017-09-11 DIAGNOSIS — F4322 Adjustment disorder with anxiety: Secondary | ICD-10-CM | POA: Diagnosis not present

## 2017-09-12 DIAGNOSIS — M62452 Contracture of muscle, left thigh: Secondary | ICD-10-CM | POA: Diagnosis not present

## 2017-09-12 DIAGNOSIS — I639 Cerebral infarction, unspecified: Secondary | ICD-10-CM | POA: Diagnosis not present

## 2017-09-12 DIAGNOSIS — R262 Difficulty in walking, not elsewhere classified: Secondary | ICD-10-CM | POA: Diagnosis not present

## 2017-09-12 DIAGNOSIS — M62562 Muscle wasting and atrophy, not elsewhere classified, left lower leg: Secondary | ICD-10-CM | POA: Diagnosis not present

## 2017-09-16 DIAGNOSIS — M62452 Contracture of muscle, left thigh: Secondary | ICD-10-CM | POA: Diagnosis not present

## 2017-09-16 DIAGNOSIS — R262 Difficulty in walking, not elsewhere classified: Secondary | ICD-10-CM | POA: Diagnosis not present

## 2017-09-16 DIAGNOSIS — M62562 Muscle wasting and atrophy, not elsewhere classified, left lower leg: Secondary | ICD-10-CM | POA: Diagnosis not present

## 2017-09-16 DIAGNOSIS — I639 Cerebral infarction, unspecified: Secondary | ICD-10-CM | POA: Diagnosis not present

## 2017-09-17 DIAGNOSIS — R262 Difficulty in walking, not elsewhere classified: Secondary | ICD-10-CM | POA: Diagnosis not present

## 2017-09-17 DIAGNOSIS — I639 Cerebral infarction, unspecified: Secondary | ICD-10-CM | POA: Diagnosis not present

## 2017-09-17 DIAGNOSIS — M62452 Contracture of muscle, left thigh: Secondary | ICD-10-CM | POA: Diagnosis not present

## 2017-09-17 DIAGNOSIS — M62562 Muscle wasting and atrophy, not elsewhere classified, left lower leg: Secondary | ICD-10-CM | POA: Diagnosis not present

## 2017-09-19 DIAGNOSIS — D72829 Elevated white blood cell count, unspecified: Secondary | ICD-10-CM | POA: Diagnosis not present

## 2017-09-19 DIAGNOSIS — Z8673 Personal history of transient ischemic attack (TIA), and cerebral infarction without residual deficits: Secondary | ICD-10-CM | POA: Diagnosis not present

## 2017-09-19 DIAGNOSIS — E872 Acidosis: Secondary | ICD-10-CM | POA: Diagnosis not present

## 2017-09-19 DIAGNOSIS — I483 Typical atrial flutter: Secondary | ICD-10-CM | POA: Diagnosis not present

## 2017-09-19 DIAGNOSIS — Z87891 Personal history of nicotine dependence: Secondary | ICD-10-CM | POA: Diagnosis not present

## 2017-09-19 DIAGNOSIS — I2510 Atherosclerotic heart disease of native coronary artery without angina pectoris: Secondary | ICD-10-CM | POA: Diagnosis not present

## 2017-09-19 DIAGNOSIS — F319 Bipolar disorder, unspecified: Secondary | ICD-10-CM | POA: Diagnosis not present

## 2017-09-19 DIAGNOSIS — Z79899 Other long term (current) drug therapy: Secondary | ICD-10-CM | POA: Diagnosis not present

## 2017-09-19 DIAGNOSIS — I10 Essential (primary) hypertension: Secondary | ICD-10-CM | POA: Diagnosis not present

## 2017-09-19 DIAGNOSIS — E785 Hyperlipidemia, unspecified: Secondary | ICD-10-CM | POA: Diagnosis not present

## 2017-09-19 DIAGNOSIS — I252 Old myocardial infarction: Secondary | ICD-10-CM | POA: Diagnosis not present

## 2017-09-19 DIAGNOSIS — G479 Sleep disorder, unspecified: Secondary | ICD-10-CM | POA: Diagnosis not present

## 2017-09-19 DIAGNOSIS — I200 Unstable angina: Secondary | ICD-10-CM | POA: Diagnosis not present

## 2017-09-19 DIAGNOSIS — R079 Chest pain, unspecified: Secondary | ICD-10-CM | POA: Diagnosis not present

## 2017-09-19 DIAGNOSIS — I4892 Unspecified atrial flutter: Secondary | ICD-10-CM | POA: Diagnosis not present

## 2017-09-20 DIAGNOSIS — I4892 Unspecified atrial flutter: Secondary | ICD-10-CM | POA: Diagnosis not present

## 2017-09-20 DIAGNOSIS — D72829 Elevated white blood cell count, unspecified: Secondary | ICD-10-CM | POA: Diagnosis not present

## 2017-09-20 DIAGNOSIS — E872 Acidosis: Secondary | ICD-10-CM | POA: Diagnosis not present

## 2017-09-20 DIAGNOSIS — R079 Chest pain, unspecified: Secondary | ICD-10-CM | POA: Diagnosis not present

## 2017-09-23 ENCOUNTER — Telehealth: Payer: Self-pay | Admitting: Internal Medicine

## 2017-09-23 NOTE — Telephone Encounter (Signed)
New message    Pt is calling asking for a call back from [facility]. She did not say what it was about.

## 2017-09-23 NOTE — Telephone Encounter (Signed)
Returned call to patient.  She was calling to let us know that she had her husband drive her to the ER last Thurs because she had what she describes as a "knot" in her chest. Says her HR was 130-140 and they increased her Diltiazem to 360 mg daily.  She is due for yearly follow-up.  I have scheduled her for 10/16/17 at 11:15am. She is aware of appointment date and time.

## 2017-09-24 DIAGNOSIS — M62452 Contracture of muscle, left thigh: Secondary | ICD-10-CM | POA: Diagnosis not present

## 2017-09-24 DIAGNOSIS — M62562 Muscle wasting and atrophy, not elsewhere classified, left lower leg: Secondary | ICD-10-CM | POA: Diagnosis not present

## 2017-09-24 DIAGNOSIS — R262 Difficulty in walking, not elsewhere classified: Secondary | ICD-10-CM | POA: Diagnosis not present

## 2017-09-24 DIAGNOSIS — I639 Cerebral infarction, unspecified: Secondary | ICD-10-CM | POA: Diagnosis not present

## 2017-09-25 DIAGNOSIS — H53433 Sector or arcuate defects, bilateral: Secondary | ICD-10-CM | POA: Diagnosis not present

## 2017-09-26 DIAGNOSIS — K745 Biliary cirrhosis, unspecified: Secondary | ICD-10-CM | POA: Diagnosis not present

## 2017-09-26 DIAGNOSIS — Z139 Encounter for screening, unspecified: Secondary | ICD-10-CM | POA: Diagnosis not present

## 2017-09-26 DIAGNOSIS — K754 Autoimmune hepatitis: Secondary | ICD-10-CM | POA: Diagnosis not present

## 2017-09-26 DIAGNOSIS — H5340 Unspecified visual field defects: Secondary | ICD-10-CM | POA: Diagnosis not present

## 2017-09-26 DIAGNOSIS — F308 Other manic episodes: Secondary | ICD-10-CM | POA: Diagnosis not present

## 2017-10-08 DIAGNOSIS — Z6821 Body mass index (BMI) 21.0-21.9, adult: Secondary | ICD-10-CM | POA: Diagnosis not present

## 2017-10-08 DIAGNOSIS — I249 Acute ischemic heart disease, unspecified: Secondary | ICD-10-CM | POA: Diagnosis not present

## 2017-10-08 DIAGNOSIS — R197 Diarrhea, unspecified: Secondary | ICD-10-CM | POA: Diagnosis not present

## 2017-10-10 DIAGNOSIS — I6930 Unspecified sequelae of cerebral infarction: Secondary | ICD-10-CM | POA: Diagnosis not present

## 2017-10-10 DIAGNOSIS — H5340 Unspecified visual field defects: Secondary | ICD-10-CM | POA: Diagnosis not present

## 2017-10-10 DIAGNOSIS — H538 Other visual disturbances: Secondary | ICD-10-CM | POA: Diagnosis not present

## 2017-10-14 ENCOUNTER — Telehealth: Payer: Self-pay

## 2017-10-14 ENCOUNTER — Ambulatory Visit (INDEPENDENT_AMBULATORY_CARE_PROVIDER_SITE_OTHER): Payer: Medicare Other | Admitting: Neurology

## 2017-10-14 ENCOUNTER — Encounter: Payer: Self-pay | Admitting: Neurology

## 2017-10-14 VITALS — BP 147/85 | HR 105 | Ht 64.0 in | Wt 125.0 lb

## 2017-10-14 DIAGNOSIS — R451 Restlessness and agitation: Secondary | ICD-10-CM | POA: Diagnosis not present

## 2017-10-14 DIAGNOSIS — R42 Dizziness and giddiness: Secondary | ICD-10-CM | POA: Diagnosis not present

## 2017-10-14 DIAGNOSIS — I480 Paroxysmal atrial fibrillation: Secondary | ICD-10-CM

## 2017-10-14 DIAGNOSIS — I69398 Other sequelae of cerebral infarction: Secondary | ICD-10-CM | POA: Diagnosis not present

## 2017-10-14 DIAGNOSIS — H547 Unspecified visual loss: Secondary | ICD-10-CM | POA: Insufficient documentation

## 2017-10-14 MED ORDER — QUETIAPINE FUMARATE 25 MG PO TABS: 25.0000 mg | ORAL_TABLET | Freq: Every day | ORAL | 2 refills | Status: DC

## 2017-10-14 NOTE — Progress Notes (Signed)
[facility]  PATIENT: Abigail Sullivan DOB: Sep 24, 1946  REFERRING DOCTOR OR PCP:  Marco Collie, MD SOURCE: Patient, notes from Dr. Nyra Capes, imaging and lab reports, images on PACS personally reviewed  _________________________________   HISTORICAL  CHIEF COMPLAINT:  Chief Complaint  Patient presents with  . New Patient (Initial Visit)    HISTORY OF PRESENT ILLNESS:  I had the pleasure seeing you patient, Abigail Sullivan, at [facility] for neurologic consultation regarding her dizziness and history of strokes.  She is a 71 year old woman with chronic paroxysmal atrial fibrillation who has had 2 strokes, one in the posterior right frontal lobe and one in the medial occipital lobe.    She has been on Eliquis for the past year.   She is very compliant and has not missed any doses.     Since the spring, she has had some difficulties with vision, restlessness and  she notes lightheadedness when she is standing up.   She feels unsteady but does not note vertigo.    She feels more unsteady if she tries to turn.   This occurs the entire time she is standing up and never experiences it with sitting or laying down.     She notes the onset is immediate upon standing.     The restlessness involves fidgeting. When she is sitting or laying down,  after a while, she feels she has to get up and stand. When she is standing, after a while, she feels she needs to sit.   This usually happens after a minute or 2.  She feels her balance is worse.    She has not fallen.     She notes coordination is worse -- with both [place] and fine motor tasks like laundry, writing, buttoning.  She does not note any change in her muscle tone. There is no tremor.  She feels vision is not clear and has a haze or fuzz around every item.     She has seen ophthalmology and they noted that in August visual fields were fine but in October she had reduced RUQ visual field vision.    She often feels an  urge to stand up and walk but does not note much RLS when laying.    She sleeps well at night on the combination of olanzepine an temazepam (she sees Dr. Casimiro Needle).   She has been off the olanzepine a few times for a day or two and sleep is worse but restlessness is not any better.    She had chest discomfort a couple months ago and went to the ED.    Her diltiazem was increased from 240 to 360 mg.    I personally reviewed the 10/10/2017 and 08/19/2017 CT scans of the head. They show 2 wedge-shaped remote infarctions in the posterior right frontal lobe and the medial left occipital lobe.    There is no difference between the 2 scans  REVIEW OF SYSTEMS: Constitutional: No fevers, chills, sweats, or change in appetite Eyes: as above Ear, nose and throat: No hearing loss, ear pain, nasal congestion, sore throat Cardiovascular: She has A. fib. She does not note palpitations. She did have an episode of chest tightness recently Respiratory: No shortness of breath at rest or with exertion.   No wheezes GastrointestinaI: No nausea, vomiting, diarrhea, abdominal pain, fecal incontinence Genitourinary: No dysuria, urinary retention or frequency.  No nocturia. Musculoskeletal: No neck pain, back pain Integumentary: No rash, pruritus, skin lesions Neurological: as above Psychiatric:  No depression at this time.  No anxiety Endocrine: No palpitations, diaphoresis, change in appetite, change in weigh or increased thirst Hematologic/Lymphatic: No anemia, purpura, petechiae. Allergic/Immunologic: No itchy/runny eyes, nasal congestion, recent allergic reactions, rashes  ALLERGIES: Allergies  Allergen Reactions  . Lisinopril Cough  . Lipitor [Atorvastatin]     Myalgias    HOME MEDICATIONS:  Current Outpatient Medications:  .  acyclovir (ZOVIRAX) 200 MG capsule, Take 200 mg by mouth 2 (two) times daily. , Disp: , Rfl:  .  diltiazem (CARDIZEM CD) 360 MG 24 hr capsule, Take 360 mg daily by mouth.,  Disp: , Rfl:  .  ELIQUIS 5 MG TABS tablet, TAKE 1 TABLET BY MOUTH TWICE DAILY, Disp: 180 tablet, Rfl: 1 .  estradiol (ESTRACE) 1 MG tablet, TAKE ONE TABLET BY MOUTH DAILY, Disp: , Rfl:  .  losartan (COZAAR) 25 MG tablet, Take 25 mg by mouth daily., Disp: , Rfl:  .  multivitamin (THERAGRAN) per tablet, Take 1 tablet by mouth daily.  , Disp: , Rfl:  .  nitroGLYCERIN (NITROSTAT) 0.4 MG SL tablet, Place 1 tablet (0.4 mg total) under the tongue every 5 (five) minutes x 3 doses as needed for chest pain. If no relief after 3 doses, call 911, Disp: 25 tablet, Rfl: 3 .  ranitidine (ZANTAC) 150 MG tablet, Take 150 mg by mouth at bedtime., Disp: , Rfl:  .  simvastatin (ZOCOR) 20 MG tablet, Take 20 mg by mouth at bedtime.  , Disp: , Rfl:  .  temazepam (RESTORIL) 15 MG capsule, Take 15 mg 2 (two) times daily by mouth., Disp: , Rfl:  .  ursodiol (ACTIGALL) 300 MG capsule, Take 300 mg by mouth 2 (two) times daily.  , Disp: , Rfl:  .  QUEtiapine (SEROQUEL) 25 MG tablet, Take 1 tablet (25 mg total) at bedtime by mouth., Disp: 30 tablet, Rfl: 2  PAST MEDICAL HISTORY: Past Medical History:  Diagnosis Date  . Atrial fibrillation (HCC)    post-operative   . Hepatitis   . Hyperlipidemia   . Hypertension   . Premature atrial contractions    systomatic PAC's and documented SVT ( likley atrial tachycardia )    PAST SURGICAL HISTORY: Past Surgical History:  Procedure Laterality Date  . LUMBAR LAMINECTOMY/DECOMPRESSION MICRODISCECTOMY  02/04/2011   L2-L3 and L3-L4 with diskectomy and plating    FAMILY HISTORY: Family History  Problem Relation Age of Onset  . Other Father        CABGx4    SOCIAL HISTORY:  Social History   Socioeconomic History  . Marital status: Married    Spouse name: Not on file  . Number of children: Not on file  . Years of education: Not on file  . Highest education level: Not on file  Social Needs  . Financial resource strain: Not on file  . Food insecurity - worry: Not on  file  . Food insecurity - inability: Not on file  . Transportation needs - medical: Not on file  . Transportation needs - non-medical: Not on file  Occupational History  . Not on file  Tobacco Use  . Smoking status: Light Tobacco Smoker    Packs/day: 0.30    Types: Cigarettes  . Smokeless tobacco: Never Used  . Tobacco comment: smokes 3 cigarettes per day, not interested in quitting  Substance and Sexual Activity  . Alcohol use: No  . Drug use: No  . Sexual activity: Not on file  Other Topics Concern  . Not on  file  Social History Narrative  . Not on file     PHYSICAL EXAM  Vitals:   10/14/17 1014  BP: (!) 147/85  Pulse: (!) 105  Weight: 125 lb (56.7 kg)  Height: 5\' 4"  (1.626 m)   Supine  135/80, 98 Sitting   128/75,  Standing 120/75, 102 Standing x 3 min 120/75, 100    Body mass index is 21.46 kg/m.   General: The patient is well-developed and well-nourished and in no acute distress  Eyes:  Funduscopic exam shows normal optic discs and retinal vessels.   She has a homonymous right upper quadrantopsia  Neck: The neck is supple, no carotid bruits are noted.  The neck is nontender.  Cardiovascular: The heart has an irregular rate and rhythm with a normal S1 and S2. There were no murmurs, gallops or rubs. Lungs are clear to auscultation.  Skin: Extremities are without significant edema.  Musculoskeletal:  Back is nontender  Neurologic Exam  Mental status: The patient is alert and oriented x 3 at the time of the examination. The patient has apparent normal recent and remote memory, with an apparently normal attention span and concentration ability.   Speech is normal.  Cranial nerves: Extraocular movements are full. Pupils are equal, round, and reactive to light and accomodation.   There is good facial sensation to soft touch bilaterally.Facial strength is normal.  Trapezius and sternocleidomastoid strength is normal. No dysarthria is noted.  The tongue is  midline, and the patient has symmetric elevation of the soft palate. No obvious hearing deficits are noted.  Motor:  Muscle bulk is normal.   Tone is normal. Strength is  5 / 5 in all 4 extremities.   Sensory: Sensory testing is intact to pinprick, soft touch and vibration sensation in all 4 extremities.  Coordination: Cerebellar testing reveals good finger-nose-finger and heel-to-shin bilaterally.  Gait and station: Station is normal.   Her stride is mildly reduce in length. She takes 4 steps to turn 180.Marland Kitchen She does not have any retropulsion. Romberg is negative.   Reflexes: Deep tendon reflexes are symmetric and normal bilaterally.   Plantar responses are flexor.    DIAGNOSTIC DATA (LABS, IMAGING, TESTING) - I reviewed patient records, labs, notes, testing and imaging myself where available.  Lab Results  Component Value Date   WBC 11.0 (H) 07/30/2017   HGB 14.5 07/30/2017   HCT 43.4 07/30/2017   MCV 90 07/30/2017   PLT 242 07/30/2017      Component Value Date/Time   NA 140 07/30/2017 1031   K 4.6 07/30/2017 1031   CL 104 07/30/2017 1031   CO2 18 (L) 07/30/2017 1031   GLUCOSE 91 07/30/2017 1031   GLUCOSE 93 06/11/2016 1028   BUN 11 07/30/2017 1031   CREATININE 0.91 07/30/2017 1031   CREATININE 1.10 (H) 06/11/2016 1028   CALCIUM 9.4 07/30/2017 1031   PROT 7.3 01/31/2009 1228   ALBUMIN 4.1 01/31/2009 1228   AST 23 01/31/2009 1228   ALT 18 01/31/2009 1228   ALKPHOS 70 01/31/2009 1228   BILITOT 0.6 01/31/2009 1228   GFRNONAA 64 07/30/2017 1031   GFRAA 73 07/30/2017 1031       ASSESSMENT AND PLAN  Lightheaded  Visual field loss following stroke  Paroxysmal atrial fibrillation (HCC)  Restlessness    In summary, Corlita Marina is a 71 year old woman who has had difficulties with lightheadedness, visual changes and restlessness for the last 6 months.   On exam, she has a right upper  quadrant visual field homonymous quadrantanopsia.   BP/pulse were not  orthostatic.  CT scan of the head from September and November of this year both show 2 chronic strokes, one in the posterior right frontal lobe and one in the medial left occipital lobe. The occipital lobe stroke could easily explain her visual field defect. The strokes do not really explain her lightheadedness or her restlessness.    I will have her switch from olanzapine to Seroquel to see if either of these symptoms improve.    Restlessness is more likely to occur with olanzapine than it is with Seroquel.  We discussed that atrial fibrillation is a major risk factor for strokes. She needs to stay on her Eliquis as anticoagulation can reduce the occurrence of these.     She will return to see me as needed if she has new or worsening neurologic symptoms.   Thank you for asking me to see Mrs. Lesperance. Please let me know if I can be of further assistance with her or other patients in the future.  Richard A. Felecia Shelling, MD, [facility] 33/29/5188, 41:66 PM Certified in Neurology, Clinical Neurophysiology, Sleep Medicine, Pain Medicine and Neuroimaging  [facility] [address], [place], [place] [address] [phone]

## 2017-10-14 NOTE — Telephone Encounter (Signed)
I received a PA request for the seroquel. I completed and submitted the request and got the approval.   CaseID: [id number] 09/14/17 - 10/14/18

## 2017-10-15 DIAGNOSIS — I639 Cerebral infarction, unspecified: Secondary | ICD-10-CM | POA: Diagnosis not present

## 2017-10-15 DIAGNOSIS — M62562 Muscle wasting and atrophy, not elsewhere classified, left lower leg: Secondary | ICD-10-CM | POA: Diagnosis not present

## 2017-10-15 DIAGNOSIS — R262 Difficulty in walking, not elsewhere classified: Secondary | ICD-10-CM | POA: Diagnosis not present

## 2017-10-15 DIAGNOSIS — M62452 Contracture of muscle, left thigh: Secondary | ICD-10-CM | POA: Diagnosis not present

## 2017-10-16 ENCOUNTER — Encounter: Payer: Self-pay | Admitting: Internal Medicine

## 2017-10-16 ENCOUNTER — Ambulatory Visit (INDEPENDENT_AMBULATORY_CARE_PROVIDER_SITE_OTHER): Payer: Medicare Other | Admitting: Internal Medicine

## 2017-10-16 VITALS — BP 120/82 | HR 107 | Ht 64.0 in | Wt 124.6 lb

## 2017-10-16 DIAGNOSIS — I10 Essential (primary) hypertension: Secondary | ICD-10-CM

## 2017-10-16 DIAGNOSIS — I480 Paroxysmal atrial fibrillation: Secondary | ICD-10-CM

## 2017-10-16 DIAGNOSIS — I482 Chronic atrial fibrillation, unspecified: Secondary | ICD-10-CM

## 2017-10-16 MED ORDER — DILTIAZEM HCL ER COATED BEADS 360 MG PO CP24: 360.0000 mg | ORAL_CAPSULE | Freq: Every day | ORAL | 3 refills | Status: DC

## 2017-10-16 NOTE — Progress Notes (Signed)
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  Since last being seen in our clinic, the patient reports doing reasonably well.  She has developed neurologic symptoms and stroke this past spring.  Dr Garth Bigness note from 10/14/17 is reviewed and reveals that CT scan of the head from 10/10/17 and 08/19/17 show 2 wedge shaped remote infarcts in the posterior R frontal and the medial left occipital lobes. She is mostly unaware of afib, though she did present to the hospital earlier this year with chest tightness and was found to have afib with RVR.  Her diltiazem was increased to 360mg  daily.  She has done better since.  Today, she denies symptoms of palpitations, chest pain, shortness of breath,  lower extremity edema, dizziness, presyncope, or syncope.  The patient is otherwise without complaint today.   Past Medical History:  Diagnosis Date  . Atrial fibrillation (HCC)    post-operative   . Hepatitis   . Hyperlipidemia   . Hypertension   . Premature atrial contractions    systomatic PAC's and documented SVT ( likley atrial tachycardia )  . Stroke (cerebrum) [facility])    Past Surgical History:  Procedure Laterality Date  . LUMBAR LAMINECTOMY/DECOMPRESSION MICRODISCECTOMY  02/04/2011   L2-L3 and L3-L4 with diskectomy and plating    ROS- all systems are reviewed and negatives except as per HPI above  Current Outpatient Medications  Medication Sig Dispense Refill  . acyclovir (ZOVIRAX) 200 MG capsule Take 200 mg by mouth 2 (two) times daily.     Marland Kitchen diltiazem (CARDIZEM CD) 360 MG 24 hr capsule Take 360 mg daily by mouth.    Arne Cleveland 5 MG TABS tablet TAKE 1 TABLET BY MOUTH TWICE DAILY 180 tablet 1  . estradiol (ESTRACE) 1 MG tablet 2 mg. TAKE ONE TABLET BY MOUTH DAILY    . losartan (COZAAR) 25 MG tablet Take 25 mg by mouth daily.    . medroxyPROGESTERone (PROVERA) 5 MG tablet Take 5 mg daily by mouth.    .  multivitamin (THERAGRAN) per tablet Take 1 tablet by mouth daily.      . nitroGLYCERIN (NITROSTAT) 0.4 MG SL tablet Place 1 tablet (0.4 mg total) under the tongue every 5 (five) minutes x 3 doses as needed for chest pain. If no relief after 3 doses, call 911 25 tablet 3  . QUEtiapine (SEROQUEL) 25 MG tablet Take 1 tablet (25 mg total) at bedtime by mouth. 30 tablet 2  . ranitidine (ZANTAC) 150 MG tablet Take 150 mg by mouth at bedtime.    . simvastatin (ZOCOR) 20 MG tablet Take 20 mg by mouth at bedtime.      . temazepam (RESTORIL) 15 MG capsule Take 15 mg 2 (two) times daily by mouth.    . ursodiol (ACTIGALL) 300 MG capsule Take 300 mg by mouth 2 (two) times daily.       No current facility-administered medications for this visit.     Physical Exam: Vitals:   10/16/17 1113  BP: 120/82  Pulse: (!) 107  SpO2: 99%  Weight: 124 lb 9.6 oz (56.5 kg)  Height: 5\' 4"  (1.626 m)    GEN- The patient is well appearing, alert and oriented x 3 today.   Head- normocephalic, atraumatic Eyes-  Sclera clear, conjunctiva pink Ears- hearing intact Oropharynx- clear Lungs- Clear to ausculation bilaterally, normal work of breathing Heart- Regular rate and rhythm, no murmurs,  rubs or gallops, PMI not laterally displaced GI- soft, NT, ND, + BS Extremities- no clubbing, cyanosis, or edema  EKG tracing ordered today is personally reviewed and shows sinus tachycardia 107 bpm, otherwise normal ekg  Assessment and Plan:  1. Atrial fibrillation Well controlled Continue current dosing of diltiazem Continue on eliquis long term as per neurology recommendation (Dr Garth Bigness note from 10/14/17 is reviewed) chads2vasc score is at least 5.  2. HTN Stable No change required today  3. Tobacco She quit in January!!  Return to see me in a year  Thompson Grayer MD, [facility] 10/16/2017 11:18 AM

## 2017-10-16 NOTE — Patient Instructions (Signed)
Medication Instructions:  Your physician recommends that you continue on your current medications as directed. Please refer to the Current Medication list given to you today.  -- If you need a refill on your cardiac medications before your next appointment, please call your pharmacy. --  Labwork: None ordered  Testing/Procedures: None ordered  Follow-Up: Your physician wants you to follow-up in: 1 year with Dr. Rayann Heman.    Thank you for choosing [facility]!!   Frederik Schmidt, RN [phone]  Any Other Special Instructions Will Be Listed Below (If Applicable).

## 2017-10-17 DIAGNOSIS — I69398 Other sequelae of cerebral infarction: Secondary | ICD-10-CM | POA: Diagnosis not present

## 2017-10-17 DIAGNOSIS — M62452 Contracture of muscle, left thigh: Secondary | ICD-10-CM | POA: Diagnosis not present

## 2017-10-17 DIAGNOSIS — H53453 Other localized visual field defect, bilateral: Secondary | ICD-10-CM | POA: Diagnosis not present

## 2017-10-17 DIAGNOSIS — M62562 Muscle wasting and atrophy, not elsewhere classified, left lower leg: Secondary | ICD-10-CM | POA: Diagnosis not present

## 2017-10-17 DIAGNOSIS — R262 Difficulty in walking, not elsewhere classified: Secondary | ICD-10-CM | POA: Diagnosis not present

## 2017-10-17 DIAGNOSIS — R42 Dizziness and giddiness: Secondary | ICD-10-CM | POA: Diagnosis not present

## 2017-10-17 DIAGNOSIS — R197 Diarrhea, unspecified: Secondary | ICD-10-CM | POA: Diagnosis not present

## 2017-10-17 DIAGNOSIS — I639 Cerebral infarction, unspecified: Secondary | ICD-10-CM | POA: Diagnosis not present

## 2017-10-18 DIAGNOSIS — I639 Cerebral infarction, unspecified: Secondary | ICD-10-CM | POA: Diagnosis not present

## 2017-10-18 DIAGNOSIS — M62562 Muscle wasting and atrophy, not elsewhere classified, left lower leg: Secondary | ICD-10-CM | POA: Diagnosis not present

## 2017-10-18 DIAGNOSIS — R262 Difficulty in walking, not elsewhere classified: Secondary | ICD-10-CM | POA: Diagnosis not present

## 2017-10-18 DIAGNOSIS — M62452 Contracture of muscle, left thigh: Secondary | ICD-10-CM | POA: Diagnosis not present

## 2017-10-23 DIAGNOSIS — R262 Difficulty in walking, not elsewhere classified: Secondary | ICD-10-CM | POA: Diagnosis not present

## 2017-10-23 DIAGNOSIS — M62562 Muscle wasting and atrophy, not elsewhere classified, left lower leg: Secondary | ICD-10-CM | POA: Diagnosis not present

## 2017-10-23 DIAGNOSIS — M62452 Contracture of muscle, left thigh: Secondary | ICD-10-CM | POA: Diagnosis not present

## 2017-10-23 DIAGNOSIS — I639 Cerebral infarction, unspecified: Secondary | ICD-10-CM | POA: Diagnosis not present

## 2017-10-29 DIAGNOSIS — F4322 Adjustment disorder with anxiety: Secondary | ICD-10-CM | POA: Diagnosis not present

## 2017-10-31 DIAGNOSIS — R262 Difficulty in walking, not elsewhere classified: Secondary | ICD-10-CM | POA: Diagnosis not present

## 2017-10-31 DIAGNOSIS — M62452 Contracture of muscle, left thigh: Secondary | ICD-10-CM | POA: Diagnosis not present

## 2017-10-31 DIAGNOSIS — I639 Cerebral infarction, unspecified: Secondary | ICD-10-CM | POA: Diagnosis not present

## 2017-10-31 DIAGNOSIS — M62562 Muscle wasting and atrophy, not elsewhere classified, left lower leg: Secondary | ICD-10-CM | POA: Diagnosis not present

## 2017-12-26 ENCOUNTER — Other Ambulatory Visit: Payer: Self-pay | Admitting: Internal Medicine

## 2017-12-26 DIAGNOSIS — F3110 Bipolar disorder, current episode manic without psychotic features, unspecified: Secondary | ICD-10-CM | POA: Diagnosis not present

## 2017-12-26 DIAGNOSIS — N183 Chronic kidney disease, stage 3 (moderate): Secondary | ICD-10-CM | POA: Diagnosis not present

## 2017-12-26 DIAGNOSIS — I129 Hypertensive chronic kidney disease with stage 1 through stage 4 chronic kidney disease, or unspecified chronic kidney disease: Secondary | ICD-10-CM | POA: Diagnosis not present

## 2018-01-01 DIAGNOSIS — I10 Essential (primary) hypertension: Secondary | ICD-10-CM | POA: Diagnosis not present

## 2018-01-01 DIAGNOSIS — E782 Mixed hyperlipidemia: Secondary | ICD-10-CM | POA: Diagnosis not present

## 2018-01-10 DIAGNOSIS — N183 Chronic kidney disease, stage 3 (moderate): Secondary | ICD-10-CM | POA: Diagnosis not present

## 2018-01-10 DIAGNOSIS — I129 Hypertensive chronic kidney disease with stage 1 through stage 4 chronic kidney disease, or unspecified chronic kidney disease: Secondary | ICD-10-CM | POA: Diagnosis not present

## 2018-01-10 DIAGNOSIS — K745 Biliary cirrhosis, unspecified: Secondary | ICD-10-CM | POA: Diagnosis not present

## 2018-01-10 DIAGNOSIS — E782 Mixed hyperlipidemia: Secondary | ICD-10-CM | POA: Diagnosis not present

## 2018-01-23 DIAGNOSIS — K754 Autoimmune hepatitis: Secondary | ICD-10-CM | POA: Diagnosis not present

## 2018-01-23 DIAGNOSIS — I6389 Other cerebral infarction: Secondary | ICD-10-CM | POA: Diagnosis not present

## 2018-01-23 DIAGNOSIS — Z789 Other specified health status: Secondary | ICD-10-CM | POA: Diagnosis not present

## 2018-01-23 DIAGNOSIS — K745 Biliary cirrhosis, unspecified: Secondary | ICD-10-CM | POA: Diagnosis not present

## 2018-01-24 DIAGNOSIS — K745 Biliary cirrhosis, unspecified: Secondary | ICD-10-CM | POA: Diagnosis not present

## 2018-02-05 ENCOUNTER — Ambulatory Visit (INDEPENDENT_AMBULATORY_CARE_PROVIDER_SITE_OTHER): Payer: Medicare Other | Admitting: *Deleted

## 2018-02-05 ENCOUNTER — Other Ambulatory Visit: Payer: Self-pay | Admitting: *Deleted

## 2018-02-05 DIAGNOSIS — I482 Chronic atrial fibrillation, unspecified: Secondary | ICD-10-CM

## 2018-02-05 LAB — CBC
Hematocrit: 41.4 % (ref 34.0–46.6)
Hemoglobin: 13.9 g/dL (ref 11.1–15.9)
MCH: 31.2 pg (ref 26.6–33.0)
MCHC: 33.6 g/dL (ref 31.5–35.7)
MCV: 93 fL (ref 79–97)
PLATELETS: 332 10*3/uL (ref 150–379)
RBC: 4.45 x10E6/uL (ref 3.77–5.28)
RDW: 13.5 % (ref 12.3–15.4)
WBC: 10.9 10*3/uL — AB (ref 3.4–10.8)

## 2018-02-05 LAB — BASIC METABOLIC PANEL
BUN / CREAT RATIO: 17 (ref 12–28)
BUN: 18 mg/dL (ref 8–27)
CALCIUM: 9.7 mg/dL (ref 8.7–10.3)
CHLORIDE: 102 mmol/L (ref 96–106)
CO2: 23 mmol/L (ref 20–29)
Creatinine, Ser: 1.08 mg/dL — ABNORMAL HIGH (ref 0.57–1.00)
GFR calc non Af Amer: 52 mL/min/{1.73_m2} — ABNORMAL LOW (ref >59)
GFR, EST AFRICAN AMERICAN: 60 mL/min/{1.73_m2} (ref >59)
GLUCOSE: 128 mg/dL — AB (ref 65–99)
POTASSIUM: 4.3 mmol/L (ref 3.5–5.2)
Sodium: 140 mmol/L (ref 134–144)

## 2018-02-05 MED ORDER — NITROGLYCERIN 0.4 MG SL SUBL: SUBLINGUAL_TABLET | SUBLINGUAL | 3 refills | Status: DC

## 2018-02-05 NOTE — Progress Notes (Signed)
Pt was started on Eliquis 5mg  q 12 hours  for  Atrial Fib  on 12/22/2016.    Reviewed patients medication list.  Pt is not  currently on any combined P-gp and strong CYP3A4 inhibitors/inducers (ketoconazole, traconazole, ritonavir, carbamazepine, phenytoin, rifampin, St. John's wort).  Reviewed labs.  SCr 1.08 , Weight  51.09 kg Dose is  appropriate based on age, weight, and SCr.  Hgb 13.9 and HCT 41.4  A full discussion of the nature of anticoagulants has been carried out.  A benefit/risk analysis has been presented to the patient, so that they understand the justification for choosing anticoagulation with Eliquis at this time.  The need for compliance is stressed.  Pt is aware to take the medication twice daily.  Side effects of potential bleeding are discussed, including unusual colored urine or stools, coughing up blood or coffee ground emesis, nose bleeds or serious fall or head trauma.  Discussed signs and symptoms of stroke. The patient should avoid any OTC items containing aspirin or ibuprofen.  Avoid alcohol consumption.   Call if any signs of abnormal bleeding.  Discussed financial obligations and is not having  any difficulty in obtaining medication.  Pt states she will have PCP check labs yearly and will fax to Dr Rayann Heman .  Pt states she has not had any sign or symptom of bleeding States did have 2 strokes in Spring of last year  Pt states has not missed any doses of Eliquis  Reviewed with her Eliquis and she states understanding Will do CBC and BMET and will call with results Spoke with pt regarding  results of CBC and BMET  and instructed to continue Eliquis 5mg  every 12 hours as ordered and to get CBC and BMET  yearly  as long as she is on Eliquis and she states understanding

## 2018-02-06 ENCOUNTER — Telehealth: Payer: Self-pay

## 2018-02-06 NOTE — Telephone Encounter (Signed)
Pt informed of results by Elbert Ewings, RN. She was grateful for the call anyway.

## 2018-02-07 DIAGNOSIS — Z681 Body mass index (BMI) 19 or less, adult: Secondary | ICD-10-CM | POA: Diagnosis not present

## 2018-02-07 DIAGNOSIS — F308 Other manic episodes: Secondary | ICD-10-CM | POA: Diagnosis not present

## 2018-02-07 DIAGNOSIS — R634 Abnormal weight loss: Secondary | ICD-10-CM | POA: Diagnosis not present

## 2018-02-07 DIAGNOSIS — F3110 Bipolar disorder, current episode manic without psychotic features, unspecified: Secondary | ICD-10-CM | POA: Diagnosis not present

## 2018-02-21 DIAGNOSIS — K754 Autoimmune hepatitis: Secondary | ICD-10-CM | POA: Diagnosis not present

## 2018-02-25 DIAGNOSIS — F4322 Adjustment disorder with anxiety: Secondary | ICD-10-CM | POA: Diagnosis not present

## 2018-04-01 DIAGNOSIS — I129 Hypertensive chronic kidney disease with stage 1 through stage 4 chronic kidney disease, or unspecified chronic kidney disease: Secondary | ICD-10-CM | POA: Diagnosis not present

## 2018-04-01 DIAGNOSIS — I4891 Unspecified atrial fibrillation: Secondary | ICD-10-CM | POA: Diagnosis not present

## 2018-04-01 DIAGNOSIS — N183 Chronic kidney disease, stage 3 (moderate): Secondary | ICD-10-CM | POA: Diagnosis not present

## 2018-04-01 DIAGNOSIS — E782 Mixed hyperlipidemia: Secondary | ICD-10-CM | POA: Diagnosis not present

## 2018-04-03 DIAGNOSIS — E782 Mixed hyperlipidemia: Secondary | ICD-10-CM | POA: Diagnosis not present

## 2018-04-03 DIAGNOSIS — F3110 Bipolar disorder, current episode manic without psychotic features, unspecified: Secondary | ICD-10-CM | POA: Diagnosis not present

## 2018-04-03 DIAGNOSIS — R7303 Prediabetes: Secondary | ICD-10-CM | POA: Diagnosis not present

## 2018-04-03 DIAGNOSIS — N183 Chronic kidney disease, stage 3 (moderate): Secondary | ICD-10-CM | POA: Diagnosis not present

## 2018-04-03 DIAGNOSIS — R7301 Impaired fasting glucose: Secondary | ICD-10-CM | POA: Diagnosis not present

## 2018-04-03 DIAGNOSIS — I129 Hypertensive chronic kidney disease with stage 1 through stage 4 chronic kidney disease, or unspecified chronic kidney disease: Secondary | ICD-10-CM | POA: Diagnosis not present

## 2018-04-03 DIAGNOSIS — R634 Abnormal weight loss: Secondary | ICD-10-CM | POA: Diagnosis not present

## 2018-04-03 DIAGNOSIS — Z139 Encounter for screening, unspecified: Secondary | ICD-10-CM | POA: Diagnosis not present

## 2018-04-15 DIAGNOSIS — L219 Seborrheic dermatitis, unspecified: Secondary | ICD-10-CM | POA: Diagnosis not present

## 2018-04-22 ENCOUNTER — Other Ambulatory Visit: Payer: Self-pay | Admitting: Gastroenterology

## 2018-04-22 DIAGNOSIS — K745 Biliary cirrhosis, unspecified: Secondary | ICD-10-CM

## 2018-05-02 ENCOUNTER — Ambulatory Visit
Admission: RE | Admit: 2018-05-02 | Discharge: 2018-05-02 | Disposition: A | Discharge status: Home / Self Care | Payer: Medicare Other | Source: Ambulatory Visit | Attending: Gastroenterology | Admitting: Gastroenterology

## 2018-05-02 DIAGNOSIS — F3110 Bipolar disorder, current episode manic without psychotic features, unspecified: Secondary | ICD-10-CM | POA: Diagnosis not present

## 2018-05-02 DIAGNOSIS — K7460 Unspecified cirrhosis of liver: Secondary | ICD-10-CM | POA: Diagnosis not present

## 2018-05-02 DIAGNOSIS — K745 Biliary cirrhosis, unspecified: Secondary | ICD-10-CM

## 2018-05-02 DIAGNOSIS — N183 Chronic kidney disease, stage 3 (moderate): Secondary | ICD-10-CM | POA: Diagnosis not present

## 2018-05-02 MED ORDER — IOPAMIDOL (ISOVUE-300) INJECTION 61%
100.0000 mL | Freq: Once | INTRAVENOUS | Status: AC | PRN
  Administered 2018-05-02: 100 mL via INTRAVENOUS

## 2018-05-06 DIAGNOSIS — Z0000 Encounter for general adult medical examination without abnormal findings: Secondary | ICD-10-CM | POA: Diagnosis not present

## 2018-05-06 DIAGNOSIS — Z1331 Encounter for screening for depression: Secondary | ICD-10-CM | POA: Diagnosis not present

## 2018-05-06 DIAGNOSIS — Z139 Encounter for screening, unspecified: Secondary | ICD-10-CM | POA: Diagnosis not present

## 2018-05-26 DIAGNOSIS — F4322 Adjustment disorder with anxiety: Secondary | ICD-10-CM | POA: Diagnosis not present

## 2018-05-28 DIAGNOSIS — K745 Biliary cirrhosis, unspecified: Secondary | ICD-10-CM | POA: Diagnosis not present

## 2018-06-01 DIAGNOSIS — I129 Hypertensive chronic kidney disease with stage 1 through stage 4 chronic kidney disease, or unspecified chronic kidney disease: Secondary | ICD-10-CM | POA: Diagnosis not present

## 2018-06-01 DIAGNOSIS — I700 Atherosclerosis of aorta: Secondary | ICD-10-CM | POA: Diagnosis not present

## 2018-06-01 DIAGNOSIS — E782 Mixed hyperlipidemia: Secondary | ICD-10-CM | POA: Diagnosis not present

## 2018-06-01 DIAGNOSIS — N183 Chronic kidney disease, stage 3 (moderate): Secondary | ICD-10-CM | POA: Diagnosis not present

## 2018-06-21 ENCOUNTER — Other Ambulatory Visit: Payer: Self-pay | Admitting: Internal Medicine

## 2018-06-23 NOTE — Telephone Encounter (Signed)
Pt is a 72 yr old female who saw Dr Rayann Heman on 10/14/17. Weight at that visit was 56.5Kg. SCr on 02/05/18 was 1.08. Will refill Eliquis 5mg  BID.

## 2018-07-02 DIAGNOSIS — E782 Mixed hyperlipidemia: Secondary | ICD-10-CM | POA: Diagnosis not present

## 2018-07-02 DIAGNOSIS — I700 Atherosclerosis of aorta: Secondary | ICD-10-CM | POA: Diagnosis not present

## 2018-07-02 DIAGNOSIS — I129 Hypertensive chronic kidney disease with stage 1 through stage 4 chronic kidney disease, or unspecified chronic kidney disease: Secondary | ICD-10-CM | POA: Diagnosis not present

## 2018-07-02 DIAGNOSIS — N183 Chronic kidney disease, stage 3 (moderate): Secondary | ICD-10-CM | POA: Diagnosis not present

## 2018-07-08 DIAGNOSIS — F3110 Bipolar disorder, current episode manic without psychotic features, unspecified: Secondary | ICD-10-CM | POA: Diagnosis not present

## 2018-07-08 DIAGNOSIS — Z131 Encounter for screening for diabetes mellitus: Secondary | ICD-10-CM | POA: Diagnosis not present

## 2018-07-08 DIAGNOSIS — E782 Mixed hyperlipidemia: Secondary | ICD-10-CM | POA: Diagnosis not present

## 2018-07-08 DIAGNOSIS — I129 Hypertensive chronic kidney disease with stage 1 through stage 4 chronic kidney disease, or unspecified chronic kidney disease: Secondary | ICD-10-CM | POA: Diagnosis not present

## 2018-07-18 DIAGNOSIS — I700 Atherosclerosis of aorta: Secondary | ICD-10-CM | POA: Diagnosis not present

## 2018-07-18 DIAGNOSIS — H524 Presbyopia: Secondary | ICD-10-CM | POA: Diagnosis not present

## 2018-07-18 DIAGNOSIS — I129 Hypertensive chronic kidney disease with stage 1 through stage 4 chronic kidney disease, or unspecified chronic kidney disease: Secondary | ICD-10-CM | POA: Diagnosis not present

## 2018-07-18 DIAGNOSIS — N183 Chronic kidney disease, stage 3 (moderate): Secondary | ICD-10-CM | POA: Diagnosis not present

## 2018-07-18 DIAGNOSIS — E782 Mixed hyperlipidemia: Secondary | ICD-10-CM | POA: Diagnosis not present

## 2018-07-18 DIAGNOSIS — H26493 Other secondary cataract, bilateral: Secondary | ICD-10-CM | POA: Diagnosis not present

## 2018-07-24 DIAGNOSIS — F4322 Adjustment disorder with anxiety: Secondary | ICD-10-CM | POA: Diagnosis not present

## 2018-08-01 DIAGNOSIS — E782 Mixed hyperlipidemia: Secondary | ICD-10-CM | POA: Diagnosis not present

## 2018-08-01 DIAGNOSIS — N183 Chronic kidney disease, stage 3 (moderate): Secondary | ICD-10-CM | POA: Diagnosis not present

## 2018-08-01 DIAGNOSIS — I129 Hypertensive chronic kidney disease with stage 1 through stage 4 chronic kidney disease, or unspecified chronic kidney disease: Secondary | ICD-10-CM | POA: Diagnosis not present

## 2018-08-01 DIAGNOSIS — I700 Atherosclerosis of aorta: Secondary | ICD-10-CM | POA: Diagnosis not present

## 2018-08-18 DIAGNOSIS — R072 Precordial pain: Secondary | ICD-10-CM | POA: Diagnosis not present

## 2018-08-18 DIAGNOSIS — Z79899 Other long term (current) drug therapy: Secondary | ICD-10-CM | POA: Diagnosis not present

## 2018-08-18 DIAGNOSIS — I10 Essential (primary) hypertension: Secondary | ICD-10-CM | POA: Diagnosis not present

## 2018-08-18 DIAGNOSIS — Z7901 Long term (current) use of anticoagulants: Secondary | ICD-10-CM | POA: Diagnosis not present

## 2018-08-18 DIAGNOSIS — I252 Old myocardial infarction: Secondary | ICD-10-CM | POA: Diagnosis not present

## 2018-08-18 DIAGNOSIS — R4781 Slurred speech: Secondary | ICD-10-CM | POA: Diagnosis not present

## 2018-08-18 DIAGNOSIS — H8110 Benign paroxysmal vertigo, unspecified ear: Secondary | ICD-10-CM | POA: Diagnosis not present

## 2018-08-18 DIAGNOSIS — Z8673 Personal history of transient ischemic attack (TIA), and cerebral infarction without residual deficits: Secondary | ICD-10-CM | POA: Diagnosis not present

## 2018-08-18 DIAGNOSIS — E785 Hyperlipidemia, unspecified: Secondary | ICD-10-CM | POA: Diagnosis not present

## 2018-08-18 DIAGNOSIS — R42 Dizziness and giddiness: Secondary | ICD-10-CM | POA: Diagnosis not present

## 2018-08-18 DIAGNOSIS — I2510 Atherosclerotic heart disease of native coronary artery without angina pectoris: Secondary | ICD-10-CM | POA: Diagnosis not present

## 2018-08-18 DIAGNOSIS — F5104 Psychophysiologic insomnia: Secondary | ICD-10-CM | POA: Diagnosis not present

## 2018-08-18 DIAGNOSIS — I482 Chronic atrial fibrillation: Secondary | ICD-10-CM | POA: Diagnosis not present

## 2018-08-18 DIAGNOSIS — R079 Chest pain, unspecified: Secondary | ICD-10-CM | POA: Diagnosis not present

## 2018-08-18 DIAGNOSIS — G9389 Other specified disorders of brain: Secondary | ICD-10-CM | POA: Diagnosis not present

## 2018-08-19 DIAGNOSIS — R42 Dizziness and giddiness: Secondary | ICD-10-CM | POA: Diagnosis not present

## 2018-08-19 DIAGNOSIS — R200 Anesthesia of skin: Secondary | ICD-10-CM | POA: Diagnosis not present

## 2018-08-19 DIAGNOSIS — R079 Chest pain, unspecified: Secondary | ICD-10-CM | POA: Diagnosis not present

## 2018-08-20 DIAGNOSIS — R42 Dizziness and giddiness: Secondary | ICD-10-CM | POA: Diagnosis not present

## 2018-08-20 DIAGNOSIS — E785 Hyperlipidemia, unspecified: Secondary | ICD-10-CM | POA: Diagnosis not present

## 2018-08-20 DIAGNOSIS — F5104 Psychophysiologic insomnia: Secondary | ICD-10-CM | POA: Diagnosis not present

## 2018-08-20 DIAGNOSIS — R079 Chest pain, unspecified: Secondary | ICD-10-CM | POA: Diagnosis not present

## 2018-08-20 DIAGNOSIS — Z8673 Personal history of transient ischemic attack (TIA), and cerebral infarction without residual deficits: Secondary | ICD-10-CM | POA: Diagnosis not present

## 2018-08-20 DIAGNOSIS — H8110 Benign paroxysmal vertigo, unspecified ear: Secondary | ICD-10-CM | POA: Diagnosis not present

## 2018-08-20 DIAGNOSIS — I482 Chronic atrial fibrillation: Secondary | ICD-10-CM | POA: Diagnosis not present

## 2018-08-20 DIAGNOSIS — I10 Essential (primary) hypertension: Secondary | ICD-10-CM | POA: Diagnosis not present

## 2018-08-26 ENCOUNTER — Encounter: Payer: Self-pay | Admitting: Internal Medicine

## 2018-09-01 DIAGNOSIS — Z23 Encounter for immunization: Secondary | ICD-10-CM | POA: Diagnosis not present

## 2018-09-01 DIAGNOSIS — I700 Atherosclerosis of aorta: Secondary | ICD-10-CM | POA: Diagnosis not present

## 2018-09-01 DIAGNOSIS — I129 Hypertensive chronic kidney disease with stage 1 through stage 4 chronic kidney disease, or unspecified chronic kidney disease: Secondary | ICD-10-CM | POA: Diagnosis not present

## 2018-09-01 DIAGNOSIS — K745 Biliary cirrhosis, unspecified: Secondary | ICD-10-CM | POA: Diagnosis not present

## 2018-09-01 DIAGNOSIS — N183 Chronic kidney disease, stage 3 (moderate): Secondary | ICD-10-CM | POA: Diagnosis not present

## 2018-09-09 ENCOUNTER — Telehealth: Payer: Self-pay | Admitting: Internal Medicine

## 2018-09-09 NOTE — Telephone Encounter (Signed)
Pt has appt with Allred tomorrow and is asking if she can have her yearly blood work done for fu from [employer], not sure when it is due -pls let me know and I will call the pt and let her know

## 2018-09-10 ENCOUNTER — Encounter: Payer: Self-pay | Admitting: Internal Medicine

## 2018-09-10 ENCOUNTER — Ambulatory Visit (INDEPENDENT_AMBULATORY_CARE_PROVIDER_SITE_OTHER): Payer: Medicare Other | Admitting: Internal Medicine

## 2018-09-10 VITALS — BP 122/66 | HR 104 | Ht 63.0 in | Wt 114.8 lb

## 2018-09-10 DIAGNOSIS — R079 Chest pain, unspecified: Secondary | ICD-10-CM | POA: Diagnosis not present

## 2018-09-10 DIAGNOSIS — I480 Paroxysmal atrial fibrillation: Secondary | ICD-10-CM

## 2018-09-10 DIAGNOSIS — I10 Essential (primary) hypertension: Secondary | ICD-10-CM | POA: Diagnosis not present

## 2018-09-10 DIAGNOSIS — I482 Chronic atrial fibrillation, unspecified: Secondary | ICD-10-CM

## 2018-09-10 LAB — HEPATIC FUNCTION PANEL
ALT: 20 IU/L (ref 0–32)
AST: 23 IU/L (ref 0–40)
Albumin: 4.3 g/dL (ref 3.5–4.8)
Alkaline Phosphatase: 225 IU/L — ABNORMAL HIGH (ref 39–117)
Bilirubin Total: 0.3 mg/dL (ref 0.0–1.2)
Bilirubin, Direct: 0.14 mg/dL (ref 0.00–0.40)
Total Protein: 7.6 g/dL (ref 6.0–8.5)

## 2018-09-10 LAB — LIPID PANEL
CHOL/HDL RATIO: 2.4 ratio (ref 0.0–4.4)
Cholesterol, Total: 151 mg/dL (ref 100–199)
HDL: 62 mg/dL (ref >39)
LDL Calculated: 64 mg/dL (ref 0–99)
TRIGLYCERIDES: 124 mg/dL (ref 0–149)
VLDL CHOLESTEROL CAL: 25 mg/dL (ref 5–40)

## 2018-09-10 LAB — CBC
HEMOGLOBIN: 13.6 g/dL (ref 11.1–15.9)
Hematocrit: 40.2 % (ref 34.0–46.6)
MCH: 30.4 pg (ref 26.6–33.0)
MCHC: 33.8 g/dL (ref 31.5–35.7)
MCV: 90 fL (ref 79–97)
Platelets: 353 10*3/uL (ref 150–450)
RBC: 4.48 x10E6/uL (ref 3.77–5.28)
RDW: 13.6 % (ref 12.3–15.4)
WBC: 7.4 10*3/uL (ref 3.4–10.8)

## 2018-09-10 LAB — BASIC METABOLIC PANEL
BUN/Creatinine Ratio: 17 (ref 12–28)
BUN: 16 mg/dL (ref 8–27)
CALCIUM: 9.8 mg/dL (ref 8.7–10.3)
CHLORIDE: 100 mmol/L (ref 96–106)
CO2: 22 mmol/L (ref 20–29)
Creatinine, Ser: 0.96 mg/dL (ref 0.57–1.00)
GFR calc Af Amer: 68 mL/min/{1.73_m2} (ref >59)
GFR calc non Af Amer: 59 mL/min/{1.73_m2} — ABNORMAL LOW (ref >59)
GLUCOSE: 91 mg/dL (ref 65–99)
Potassium: 4.4 mmol/L (ref 3.5–5.2)
Sodium: 140 mmol/L (ref 134–144)

## 2018-09-10 MED ORDER — NITROGLYCERIN 0.4 MG SL SUBL: SUBLINGUAL_TABLET | SUBLINGUAL | 3 refills | Status: DC

## 2018-09-10 NOTE — Progress Notes (Signed)
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  Since last being seen in our clinic, the patient reports doing reasonably well.  She has difficulty sleeping and continues to follow with her psychiatrist for some issues.  She is s/p stroke and her spouse reports some difficulty with memory.   She presented to [facility]/18/18 with atypical chest pain.  Myoview was normal.  There was report of afib, though I do not have EKG to confirm.  She feels that she has more frequent palpitations.  She is concerned that her AF has progressed. Today, she denies symptoms of shortness of breath,  lower extremity edema, dizziness, presyncope, or syncope.  The patient is otherwise without complaint today.   Past Medical History:  Diagnosis Date  . Atrial fibrillation (HCC)    post-operative   . Hepatitis   . Hyperlipidemia   . Hypertension   . Premature atrial contractions    systomatic PAC's and documented SVT ( likley atrial tachycardia )  . Stroke (cerebrum) [facility])    Past Surgical History:  Procedure Laterality Date  . LUMBAR LAMINECTOMY/DECOMPRESSION MICRODISCECTOMY  02/04/2011   L2-L3 and L3-L4 with diskectomy and plating    ROS- all systems are reviewed and negatives except as per HPI above  Current Outpatient Medications  Medication Sig Dispense Refill  . acyclovir (ZOVIRAX) 200 MG capsule Take 200 mg by mouth 2 (two) times daily.     Marland Kitchen diltiazem (CARDIZEM CD) 360 MG 24 hr capsule Take 1 capsule (360 mg total) daily by mouth. 90 capsule 3  . ELIQUIS 5 MG TABS tablet TAKE 1 TABLET(5 MG) BY MOUTH TWICE DAILY 60 tablet 6  . losartan (COZAAR) 25 MG tablet Take 25 mg by mouth daily.    . multivitamin (THERAGRAN) per tablet Take 1 tablet by mouth daily.      . nitroGLYCERIN (NITROSTAT) 0.4 MG SL tablet Place 1 tablet under the tongue every 5 minutes. Max 3 doses. If no relief after 3 doses, call 911  25 tablet 3  . ranitidine (ZANTAC) 150 MG tablet Take 150 mg by mouth at bedtime.    . simvastatin (ZOCOR) 20 MG tablet Take 20 mg by mouth at bedtime.      . ursodiol (ACTIGALL) 300 MG capsule Take 300 mg by mouth 2 (two) times daily.       No current facility-administered medications for this visit.     Physical Exam: Vitals:   09/10/18 0921  BP: 122/66  Pulse: (!) 104  SpO2: 96%  Weight: 114 lb 12.8 oz (52.1 kg)  Height: 5\' 3"  (1.6 m)    GEN- The patient is well appearing, alert and oriented x 3 today.   Head- normocephalic, atraumatic Eyes-  Sclera clear, conjunctiva pink Ears- hearing intact Oropharynx- clear Lungs- Clear to ausculation bilaterally, normal work of breathing Heart- Regular rate and rhythm, no murmurs, rubs or gallops, PMI not laterally displaced GI- soft, NT, ND, + BS Extremities- no clubbing, cyanosis, or edema  Wt Readings from Last 3 Encounters:  09/10/18 114 lb 12.8 oz (52.1 kg)  10/16/17 124 lb 9.6 oz (56.5 kg)  10/14/17 125 lb (56.7 kg)    EKG tracing ordered today is personally reviewed and shows sinus tachycardia 104 bpm  Assessment and Plan:  1. afib She has had increased palpitations and is concerned that her CP may have been due to afib.  She  has previously worn monitoring which was unrevealing.  I have therefore advised long term monitoring with an implantable loop recorder to evaluate for afib as a possible cause of palpitations and chest pain. I think this will be essential for her AF management long term. Well controlled with diltiazem Continue eliquis as per neurology for chads2vasc score of 5 Bmet. Cbc today  2. HTN Stable No change required today bmet  3. Tobacco Remains quit x 2 years!  4. Chest pain Low risk myoview discussed I have offered cath for definitive coronary assessment which she is clear in her decision to decline today.  Given prior tobacco and age, I would have a low threshold to cath should she be  willing slNTG script given today ILR to evaluate arrhythmia as cause of her chest pain as above Check lfts, lipids  Return in 2 months I did offer referral to our [place] practice for convenience.  She would like to continue to see me for now.  Thompson Grayer MD, [facility] 09/10/2018 10:00 AM

## 2018-09-10 NOTE — H&P (View-Only) (Signed)
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  Since last being seen in our clinic, the patient reports doing reasonably well.  She has difficulty sleeping and continues to follow with her psychiatrist for some issues.  She is s/p stroke and her spouse reports some difficulty with memory.   She presented to [facility] 08/18/18 with atypical chest pain.  Myoview was normal.  There was report of afib, though I do not have EKG to confirm.  She feels that she has more frequent palpitations.  She is concerned that her AF has progressed. Today, she denies symptoms of shortness of breath,  lower extremity edema, dizziness, presyncope, or syncope.  The patient is otherwise without complaint today.   Past Medical History:  Diagnosis Date  . Atrial fibrillation (HCC)    post-operative   . Hepatitis   . Hyperlipidemia   . Hypertension   . Premature atrial contractions    systomatic PAC's and documented SVT ( likley atrial tachycardia )  . Stroke (cerebrum) [facility])    Past Surgical History:  Procedure Laterality Date  . LUMBAR LAMINECTOMY/DECOMPRESSION MICRODISCECTOMY  02/04/2011   L2-L3 and L3-L4 with diskectomy and plating    ROS- all systems are reviewed and negatives except as per HPI above  Current Outpatient Medications  Medication Sig Dispense Refill  . acyclovir (ZOVIRAX) 200 MG capsule Take 200 mg by mouth 2 (two) times daily.     Marland Kitchen diltiazem (CARDIZEM CD) 360 MG 24 hr capsule Take 1 capsule (360 mg total) daily by mouth. 90 capsule 3  . ELIQUIS 5 MG TABS tablet TAKE 1 TABLET(5 MG) BY MOUTH TWICE DAILY 60 tablet 6  . losartan (COZAAR) 25 MG tablet Take 25 mg by mouth daily.    . multivitamin (THERAGRAN) per tablet Take 1 tablet by mouth daily.      . nitroGLYCERIN (NITROSTAT) 0.4 MG SL tablet Place 1 tablet under the tongue every 5 minutes. Max 3 doses. If no relief after 3 doses, call 911  25 tablet 3  . ranitidine (ZANTAC) 150 MG tablet Take 150 mg by mouth at bedtime.    . simvastatin (ZOCOR) 20 MG tablet Take 20 mg by mouth at bedtime.      . ursodiol (ACTIGALL) 300 MG capsule Take 300 mg by mouth 2 (two) times daily.       No current facility-administered medications for this visit.     Physical Exam: Vitals:   09/10/18 0921  BP: 122/66  Pulse: (!) 104  SpO2: 96%  Weight: 114 lb 12.8 oz (52.1 kg)  Height: 5\' 3"  (1.6 m)    GEN- The patient is well appearing, alert and oriented x 3 today.   Head- normocephalic, atraumatic Eyes-  Sclera clear, conjunctiva pink Ears- hearing intact Oropharynx- clear Lungs- Clear to ausculation bilaterally, normal work of breathing Heart- Regular rate and rhythm, no murmurs, rubs or gallops, PMI not laterally displaced GI- soft, NT, ND, + BS Extremities- no clubbing, cyanosis, or edema  Wt Readings from Last 3 Encounters:  09/10/18 114 lb 12.8 oz (52.1 kg)  10/16/17 124 lb 9.6 oz (56.5 kg)  10/14/17 125 lb (56.7 kg)    EKG tracing ordered today is personally reviewed and shows sinus tachycardia 104 bpm  Assessment and Plan:  1. afib She has had increased palpitations and is concerned that her CP may have been due to afib.  She  has previously worn monitoring which was unrevealing.  I have therefore advised long term monitoring with an implantable loop recorder to evaluate for afib as a possible cause of palpitations and chest pain. I think this will be essential for her AF management long term. Well controlled with diltiazem Continue eliquis as per neurology for chads2vasc score of 5 Bmet. Cbc today  2. HTN Stable No change required today bmet  3. Tobacco Remains quit x 2 years!  4. Chest pain Low risk myoview discussed I have offered cath for definitive coronary assessment which she is clear in her decision to decline today.  Given prior tobacco and age, I would have a low threshold to cath should she be  willing slNTG script given today ILR to evaluate arrhythmia as cause of her chest pain as above Check lfts, lipids  Return in 2 months I did offer referral to our [place] practice for convenience.  She would like to continue to see me for now.  Thompson Grayer MD, [facility] 09/10/2018 10:00 AM

## 2018-09-10 NOTE — Patient Instructions (Addendum)
Medication Instructions:  Your physician recommends that you continue on your current medications as directed. Please refer to the Current Medication list given to you today.  Prescription sent in for Nitroglycerin   Labwork: TODAY: CBC, BMET, LIPIDS, LFTS  Testing/Procedures: Your physician has recommended that you have a implantable loop recorder inserted on 09/16/18. A pacemaker is a small device that is placed under the skin of your chest or abdomen to help control abnormal heart rhythms. This device uses electrical pulses to prompt the heart to beat at a normal rate. Pacemakers are used to treat heart rhythms that are too slow. Wires (leads) are attached to the pacemaker that goes into the chambers of your heart. This is done in the hospital and usually requires an overnight stay. Please see the instruction sheet given to you today for more information.  Follow-Up: Follow-up appointment in 7-10 days after implant with device clinic for wound check.  Your physician recommends that you schedule a follow-up appointment in: 4 months with Dr. Rayann Heman   Any Other Special Instructions Will Be Listed Below (If Applicable).  Please report to the [facility] on 09/16/18 at 6:30 AM  You may have a light breakfast the morning of the procedure  You may take all of your medications the morning of the procedure  Wash chest & neck area with the antibacterial soap/surgical scrub the night before and the morning of the procedure

## 2018-09-16 ENCOUNTER — Encounter (HOSPITAL_COMMUNITY)
Admission: RE | Disposition: A | Discharge status: Home / Self Care | Payer: Self-pay | Source: Ambulatory Visit | Attending: Internal Medicine

## 2018-09-16 ENCOUNTER — Ambulatory Visit (HOSPITAL_COMMUNITY)
Admission: RE | Admit: 2018-09-16 | Discharge: 2018-09-16 | Disposition: A | Discharge status: Home / Self Care | Payer: Medicare Other | Source: Ambulatory Visit | Attending: Internal Medicine | Admitting: Internal Medicine

## 2018-09-16 ENCOUNTER — Encounter (HOSPITAL_COMMUNITY): Payer: Self-pay | Admitting: Internal Medicine

## 2018-09-16 ENCOUNTER — Other Ambulatory Visit: Payer: Self-pay

## 2018-09-16 DIAGNOSIS — Z87891 Personal history of nicotine dependence: Secondary | ICD-10-CM | POA: Insufficient documentation

## 2018-09-16 DIAGNOSIS — Z981 Arthrodesis status: Secondary | ICD-10-CM | POA: Insufficient documentation

## 2018-09-16 DIAGNOSIS — Z8673 Personal history of transient ischemic attack (TIA), and cerebral infarction without residual deficits: Secondary | ICD-10-CM | POA: Diagnosis not present

## 2018-09-16 DIAGNOSIS — I10 Essential (primary) hypertension: Secondary | ICD-10-CM | POA: Diagnosis not present

## 2018-09-16 DIAGNOSIS — Z7901 Long term (current) use of anticoagulants: Secondary | ICD-10-CM | POA: Diagnosis not present

## 2018-09-16 DIAGNOSIS — I491 Atrial premature depolarization: Secondary | ICD-10-CM | POA: Diagnosis not present

## 2018-09-16 DIAGNOSIS — E785 Hyperlipidemia, unspecified: Secondary | ICD-10-CM | POA: Insufficient documentation

## 2018-09-16 DIAGNOSIS — I4891 Unspecified atrial fibrillation: Secondary | ICD-10-CM | POA: Insufficient documentation

## 2018-09-16 DIAGNOSIS — Z8619 Personal history of other infectious and parasitic diseases: Secondary | ICD-10-CM | POA: Insufficient documentation

## 2018-09-16 DIAGNOSIS — R002 Palpitations: Secondary | ICD-10-CM | POA: Insufficient documentation

## 2018-09-16 DIAGNOSIS — Z79899 Other long term (current) drug therapy: Secondary | ICD-10-CM | POA: Diagnosis not present

## 2018-09-16 HISTORY — PX: LOOP RECORDER INSERTION: EP1214

## 2018-09-16 SURGERY — LOOP RECORDER INSERTION

## 2018-09-16 MED ORDER — LIDOCAINE-EPINEPHRINE 1 %-1:100000 IJ SOLN
INTRAMUSCULAR | Status: DC | PRN
  Administered 2018-09-16: 30 mL

## 2018-09-16 MED ORDER — LIDOCAINE-EPINEPHRINE 1 %-1:100000 IJ SOLN
INTRAMUSCULAR | Status: AC
  Filled 2018-09-16: qty 1

## 2018-09-16 SURGICAL SUPPLY — 2 items
LOOP REVEAL LINQSYS (Prosthesis & Implant Heart) ×2 IMPLANT
PACK LOOP INSERTION (CUSTOM PROCEDURE TRAY) ×2 IMPLANT

## 2018-09-16 NOTE — Discharge Instructions (Signed)
See extra sheet// loop discharge paper

## 2018-09-16 NOTE — Interval H&P Note (Signed)
History and Physical Interval Note:  09/16/2018 7:29 AM  Breona Risby  has presented today for surgery, with the diagnosis of AFIB  The various methods of treatment have been discussed with the patient and family. After consideration of risks, benefits and other options for treatment, the patient has consented to  Procedure(s): LOOP RECORDER INSERTION (N/A) as a surgical intervention .  The patient's history has been reviewed, patient examined, no change in status, stable for surgery.  I have reviewed the patient's chart and labs.  Questions were answered to the patient's satisfaction.     Thompson Grayer

## 2018-09-17 ENCOUNTER — Other Ambulatory Visit: Payer: Self-pay

## 2018-09-17 ENCOUNTER — Ambulatory Visit (INDEPENDENT_AMBULATORY_CARE_PROVIDER_SITE_OTHER): Payer: Medicare Other | Admitting: Neurology

## 2018-09-17 ENCOUNTER — Encounter

## 2018-09-17 ENCOUNTER — Encounter: Payer: Self-pay | Admitting: Neurology

## 2018-09-17 VITALS — BP 157/96 | HR 118 | Resp 20 | Ht 63.0 in | Wt 116.5 lb

## 2018-09-17 DIAGNOSIS — G4700 Insomnia, unspecified: Secondary | ICD-10-CM | POA: Diagnosis not present

## 2018-09-17 DIAGNOSIS — I69398 Other sequelae of cerebral infarction: Secondary | ICD-10-CM | POA: Diagnosis not present

## 2018-09-17 DIAGNOSIS — H547 Unspecified visual loss: Secondary | ICD-10-CM

## 2018-09-17 DIAGNOSIS — R42 Dizziness and giddiness: Secondary | ICD-10-CM | POA: Diagnosis not present

## 2018-09-17 DIAGNOSIS — I480 Paroxysmal atrial fibrillation: Secondary | ICD-10-CM

## 2018-09-17 MED ORDER — LORAZEPAM 1 MG PO TABS: 1.0000 mg | ORAL_TABLET | Freq: Three times a day (TID) | ORAL | 3 refills | Status: DC

## 2018-09-17 MED ORDER — SUVOREXANT 20 MG PO TABS: 20.0000 mg | ORAL_TABLET | Freq: Every day | ORAL | 5 refills | Status: DC

## 2018-09-17 NOTE — Progress Notes (Signed)
[facility]  PATIENT: Abigail Sullivan DOB: 05/23/1946  REFERRING DOCTOR OR PCP:  Marco Collie, MD SOURCE: Patient, notes from Dr. Nyra Capes, imaging and lab reports, images on PACS personally reviewed  _________________________________   HISTORICAL  CHIEF COMPLAINT:  Chief Complaint  Patient presents with  . Dizziness    Denies new stroke sx. Sts. lightheadedness has not improved.  "I feel like I'm going to faiint often."  Has been to the ER twice to r/o MI.  Currently has a loop recorder, inserted yesterday. Today has c/o difficulty going to and staying asleep. Sts. has tried and failed Zolpidem 5mg , Temazepam 15mg , Quetiapine 25mg , Benztropine 1mg , Lorazepam 1mg , Belsomra 20mg , Lunesta 3mg , Trazodone 100mg .  . Hx. of CVA    HISTORY OF PRESENT ILLNESS:  Abigail Sullivan is a 72 year old woman with A. fib and strokes.   She also has severe insomnia.  Update 09/17/2018: She had 2 strokes.    One was left occipital and has led to the RUQ quadrantopsia.  Another is right frontoparietal.    I reviewed her MRI form 08/19/18 and it showed her old CVA';s and microvascular ischemic change but no acute findings.   MRA showed no significant blockage.   We discussed that the AFIb likely caused her strokes in the past.     She is having more trouble sleeping.    She has tried and failed multiple medications.    She has both sleep maintenance and sleep onset insomnia.    Ambien caused a rash and did not help.   Belsomra 20 mg and lorazepam 2 mg she falls asleep and stays asleep.    Lunesta 3 mg and lorazepam has also helped.   Quetiapine helps restlessness but not sleep.   Temazepam had not helped.   Trazodone even at 200 mg did not help.    Flurazepam did not help as well as the combinations above.    Sleep worsened after her strokes.     She does use good sleep hygiene.     She feels like she is going to faint with frequent lightheaded spells and sometimes vertigo.    That led to MRIs  08/19/2018 that showed no acute findings.   She now has a loop recorder   From 10/14/2017 She is a 72 year old woman with chronic paroxysmal atrial fibrillation who has had 2 strokes, one in the posterior right frontal lobe and one in the medial occipital lobe.    She has been on Eliquis for the past year.   She is very compliant and has not missed any doses.     Since the spring, she has had some difficulties with vision, restlessness and  she notes lightheadedness when she is standing up.   She feels unsteady but does not note vertigo.    She feels more unsteady if she tries to turn.   This occurs the entire time she is standing up and never experiences it with sitting or laying down.     She notes the onset is immediate upon standing.     The restlessness involves fidgeting. When she is sitting or laying down,  after a while, she feels she has to get up and stand. When she is standing, after a while, she feels she needs to sit.   This usually happens after a minute or 2.  She feels her balance is worse.    She has not fallen.     She notes coordination is worse -- with both  Reny and fine motor tasks like laundry, writing, buttoning.  She does not note any change in her muscle tone. There is no tremor.  She feels vision is not clear and has a haze or fuzz around every item.     She has seen ophthalmology and they noted that in August visual fields were fine but in October she had reduced RUQ visual field vision.    She often feels an urge to stand up and walk but does not note much RLS when laying.    She sleeps well at night on the combination of olanzepine an temazepam (she sees Dr. Casimiro Needle).   She has been off the olanzepine a few times for a day or two and sleep is worse but restlessness is not any better.    She had chest discomfort a couple months ago and went to the ED.    Her diltiazem was increased from 240 to 360 mg.    I personally reviewed the 10/10/2017 and 08/19/2017 CT scans of the  head. They show 2 wedge-shaped remote infarctions in the posterior right frontal lobe and the medial left occipital lobe.    There is no difference between the 2 scans  REVIEW OF SYSTEMS: Constitutional: No fevers, chills, sweats, or change in appetite Eyes: as above Ear, nose and throat: No hearing loss, ear pain, nasal congestion, sore throat Cardiovascular: She has A. fib. She does not note palpitations. She did have an episode of chest tightness recently Respiratory: No shortness of breath at rest or with exertion.   No wheezes GastrointestinaI: No nausea, vomiting, diarrhea, abdominal pain, fecal incontinence Genitourinary: No dysuria, urinary retention or frequency.  No nocturia. Musculoskeletal: No neck pain, back pain Integumentary: No rash, pruritus, skin lesions Neurological: as above Psychiatric: No depression at this time.  No anxiety Endocrine: No palpitations, diaphoresis, change in appetite, change in weigh or increased thirst Hematologic/Lymphatic: No anemia, purpura, petechiae. Allergic/Immunologic: No itchy/runny eyes, nasal congestion, recent allergic reactions, rashes  ALLERGIES: Allergies  Allergen Reactions  . Lisinopril Cough  . Lipitor [Atorvastatin]     Myalgias    HOME MEDICATIONS:  Current Outpatient Medications:  .  acetaminophen (TYLENOL) 500 MG tablet, Take 1,500 mg by mouth daily as needed for moderate pain., Disp: , Rfl:  .  acyclovir (ZOVIRAX) 200 MG capsule, Take 200 mg by mouth 2 (two) times daily. , Disp: , Rfl:  .  diltiazem (CARDIZEM CD) 180 MG 24 hr capsule, Take 360 mg by mouth daily., Disp: , Rfl:  .  diltiazem (CARDIZEM CD) 360 MG 24 hr capsule, Take 1 capsule (360 mg total) daily by mouth., Disp: 90 capsule, Rfl: 3 .  ELIQUIS 5 MG TABS tablet, TAKE 1 TABLET(5 MG) BY MOUTH TWICE DAILY (Patient taking differently: Take 5 mg by mouth 2 (two) times daily. ), Disp: 60 tablet, Rfl: 6 .  ibuprofen (ADVIL,MOTRIN) 200 MG tablet, Take 600-800 mg  by mouth daily as needed (back pain)., Disp: , Rfl:  .  losartan (COZAAR) 25 MG tablet, Take 25 mg by mouth daily., Disp: , Rfl:  .  multivitamin (THERAGRAN) per tablet, Take 1 tablet by mouth 2 (two) times a week. , Disp: , Rfl:  .  nitroGLYCERIN (NITROSTAT) 0.4 MG SL tablet, Place 1 tablet under the tongue every 5 minutes. Max 3 doses. If no relief after 3 doses, call 911, Disp: 25 tablet, Rfl: 3 .  ranitidine (ZANTAC) 150 MG tablet, Take 150 mg by mouth daily as needed for heartburn. ,  Disp: , Rfl:  .  simvastatin (ZOCOR) 20 MG tablet, Take 20 mg by mouth every evening. , Disp: , Rfl:  .  ursodiol (ACTIGALL) 300 MG capsule, Take 300 mg by mouth 2 (two) times daily.  , Disp: , Rfl:  .  LORazepam (ATIVAN) 1 MG tablet, Take 1 tablet (1 mg total) by mouth every 8 (eight) hours., Disp: 60 tablet, Rfl: 3 .  Suvorexant (BELSOMRA) 20 MG TABS, Take 20 mg by mouth at bedtime., Disp: 30 tablet, Rfl: 5  PAST MEDICAL HISTORY: Past Medical History:  Diagnosis Date  . Atrial fibrillation (HCC)    post-operative   . Hepatitis   . Hyperlipidemia   . Hypertension   . Premature atrial contractions    systomatic PAC's and documented SVT ( likley atrial tachycardia )  . Stroke (cerebrum) ([place])     PAST SURGICAL HISTORY: Past Surgical History:  Procedure Laterality Date  . LOOP RECORDER INSERTION N/A 09/16/2018   Procedure: LOOP RECORDER INSERTION;  Surgeon: Thompson Grayer, MD;  Location: [facility];  Service: Cardiovascular;  Laterality: N/A;  . LUMBAR LAMINECTOMY/DECOMPRESSION MICRODISCECTOMY  02/04/2011   L2-L3 and L3-L4 with diskectomy and plating    FAMILY HISTORY: Family History  Problem Relation Age of Onset  . Other Father        CABGx4    SOCIAL HISTORY:  Social History   Socioeconomic History  . Marital status: Married    Spouse name: Not on file  . Number of children: Not on file  . Years of education: Not on file  . Highest education level: Not on file  Occupational  History  . Not on file  Social Needs  . Financial resource strain: Not on file  . Food insecurity:    Worry: Not on file    Inability: Not on file  . Transportation needs:    Medical: Not on file    Non-medical: Not on file  Tobacco Use  . Smoking status: Former Smoker    Packs/day: 0.30    Types: Cigarettes    Last attempt to quit: 12/22/2016    Years since quitting: 1.7  . Smokeless tobacco: Never Used  . Tobacco comment: smokes 3 cigarettes per day, not interested in quitting  Substance and Sexual Activity  . Alcohol use: No  . Drug use: No  . Sexual activity: Not on file  Lifestyle  . Physical activity:    Days per week: Not on file    Minutes per session: Not on file  . Stress: Not on file  Relationships  . Social connections:    Talks on phone: Not on file    Gets together: Not on file    Attends religious service: Not on file    Active member of club or organization: Not on file    Attends meetings of clubs or organizations: Not on file    Relationship status: Not on file  . Intimate partner violence:    Fear of current or ex partner: Not on file    Emotionally abused: Not on file    Physically abused: Not on file    Forced sexual activity: Not on file  Other Topics Concern  . Not on file  Social History Narrative  . Not on file     PHYSICAL EXAM  Vitals:   09/17/18 0948  BP: (!) 157/96  Pulse: (!) 118  Resp: 20  Weight: 116 lb 8 oz (52.8 kg)  Height: 5\' 3"  (1.6 m)  Supine  135/80, 98 Sitting   128/75,  Standing 120/75, 102 Standing x 3 min 120/75, 100    Body mass index is 20.64 kg/m.   General: The patient is well-developed and well-nourished and in no acute distress  Eyes:  Funduscopic exam shows normal optic discs and retinal vessels.   She has a homonymous right upper quadrantanopsia, unchanged since the last visit.  Neck: The neck is supple, no carotid bruits are noted.  The neck is nontender.  Cardiovascular: The heart has an  irregular rate and rhythm with a normal S1 and S2. There were no murmurs, gallops or rubs.    Skin: Extremities are without rash or edema.  Musculoskeletal:  Back is nontender  Neurologic Exam  Mental status: The patient is alert and oriented x 3 at the time of the examination. The patient has apparent normal recent and remote memory, with an apparently normal attention span and concentration ability.   Speech is normal.  Cranial nerves: Extraocular movements are full.  Facial strength and sensation was normal.  Trapezius strength was normal.  The tongue is midline, and the patient has symmetric elevation of the soft palate. No obvious hearing deficits are noted.  Motor:  Muscle bulk is normal.   Tone is normal. Strength is  5 / 5 in all 4 extremities.   Sensory: Sensory testing is intact to pinprick, soft touch and vibration sensation in all 4 extremities.  Coordination: Cerebellar testing reveals good finger-nose-finger and heel-to-shin bilaterally.  Gait and station: Station is normal.  Gait was normal.  Tandem gait was slightly wide.  Romberg is negative.   Reflexes: Deep tendon reflexes are symmetric and normal bilaterally.        DIAGNOSTIC DATA (LABS, IMAGING, TESTING) - I reviewed patient records, labs, notes, testing and imaging myself where available.  Lab Results  Component Value Date   WBC 7.4 09/10/2018   HGB 13.6 09/10/2018   HCT 40.2 09/10/2018   MCV 90 09/10/2018   PLT 353 09/10/2018      Component Value Date/Time   NA 140 09/10/2018 1031   K 4.4 09/10/2018 1031   CL 100 09/10/2018 1031   CO2 22 09/10/2018 1031   GLUCOSE 91 09/10/2018 1031   GLUCOSE 93 06/11/2016 1028   BUN 16 09/10/2018 1031   CREATININE 0.96 09/10/2018 1031   CREATININE 1.10 (H) 06/11/2016 1028   CALCIUM 9.8 09/10/2018 1031   PROT 7.6 09/10/2018 1031   ALBUMIN 4.3 09/10/2018 1031   AST 23 09/10/2018 1031   ALT 20 09/10/2018 1031   ALKPHOS 225 (H) 09/10/2018 1031   BILITOT 0.3  09/10/2018 1031   GFRNONAA 59 (L) 09/10/2018 1031   GFRAA 68 09/10/2018 1031       ASSESSMENT AND PLAN  Visual field loss following stroke  Paroxysmal atrial fibrillation (HCC)  Lightheaded  Insomnia, unspecified type    1.   Continue medicines for her atrial fibrillation including Eliquis per cardiology. 2.   Since the stroke, she has had more trouble with insomnia.  She has done best on a combination of Ativan plus Belsomra or Ativan plus Lunesta.  I will write her for Ativan 1 to 2 mg nightly plus Belsomra 20 mg nightly. 3.    Return in 4 months or sooner if there are new or worsening neurologic symptoms.  We can do alternating visits between a nurse practitioner and myself. Sybel Standish A. Felecia Shelling, MD, [facility] 85/88/5027, 74:12 PM Certified in Neurology, Clinical Neurophysiology, Sleep Medicine, Pain Medicine  and Neuroimaging  [facility] [address] [place], [place] [address] [phone]

## 2018-09-17 NOTE — Patient Instructions (Addendum)
Try lorazepam 2 mg nightly.  If that is not effective enough, add Belsomra 20 mg nightly with the lorazepam 2 mg  If that combination is not helping enough, I will refer you for cognitive behavioral therapy to see if that can improve your sleep further.  Continue other medications for A. Fib.  We will see you back in about 4 months.

## 2018-09-29 ENCOUNTER — Ambulatory Visit (INDEPENDENT_AMBULATORY_CARE_PROVIDER_SITE_OTHER): Payer: Medicare Other | Admitting: *Deleted

## 2018-09-29 DIAGNOSIS — I480 Paroxysmal atrial fibrillation: Secondary | ICD-10-CM

## 2018-09-29 LAB — CUP PACEART INCLINIC DEVICE CHECK
Date Time Interrogation Session: 20191028092923
Implantable Pulse Generator Implant Date: 20191015

## 2018-09-29 NOTE — Progress Notes (Signed)
Wound check appointment. Steri-strips removed. Wound without redness or edema. Incision edges approximated, wound well healed. Battery status: Good. R-waves 0.34mV. 0 symptom episodes, 0 tachy episodes, 0 pause episodes, 0 brady episodes. 0 AF episodes (0% burden). Monthly summary reports and ROV with JA 12/31/17.

## 2018-10-02 DIAGNOSIS — I129 Hypertensive chronic kidney disease with stage 1 through stage 4 chronic kidney disease, or unspecified chronic kidney disease: Secondary | ICD-10-CM | POA: Diagnosis not present

## 2018-10-02 DIAGNOSIS — K745 Biliary cirrhosis, unspecified: Secondary | ICD-10-CM | POA: Diagnosis not present

## 2018-10-02 DIAGNOSIS — N183 Chronic kidney disease, stage 3 (moderate): Secondary | ICD-10-CM | POA: Diagnosis not present

## 2018-10-02 DIAGNOSIS — I700 Atherosclerosis of aorta: Secondary | ICD-10-CM | POA: Diagnosis not present

## 2018-10-08 ENCOUNTER — Telehealth: Payer: Self-pay | Admitting: *Deleted

## 2018-10-08 NOTE — Telephone Encounter (Signed)
Reviewed with Dr. Rayann Heman. He recommended that patient f/u sooner if symptoms worsen, but otherwise keep current plan.

## 2018-10-08 NOTE — Telephone Encounter (Signed)
Spoke with patient to request manual Carelink transmission for review of any "AF" episodes that have not transmitted automatically. Available ECG from 10/06/18 appears ST w/PVCs, duration 28min. Walked patient through transmission process. True AF episodes noted on 10/05/18 at 1936, duration 3hr 18min, and 10/08/18 at 0432, duration 2hr 5min. Patient denies any symptoms with these episodes, reports compliance with Eliquis.  Patient reports an episode of "chest heaviness" either yesterday or the day before, felt like "a knot." She did not try nitro because she didn't want to take a dose and have to go to the ED. Patient reports she didn't use her symptom activator because she didn't want to wake her husband up from his nap. Discussed trying nitro next time for chest discomfort/heaviness as these symptoms did not seem to be associated with an AF episode. Encouraged patient to seek emergency medical attention for persistent or worsening symptoms.   Discussed symptom activator use for sustained palpitations, dizziness, or syncope. Patient agrees to call the direct DC number during business hours if she uses her symptom activator so that the episode can be reviewed. She is aware of upcoming appointment with Dr. Rayann Heman on 12/31/18 and denies additional questions or concerns at this time.  Will review symptoms with Dr. Rayann Heman for any recommendations.

## 2018-10-10 NOTE — Telephone Encounter (Signed)
Spoke with patient. She verbalizes understanding of Dr. Jackalyn Lombard recommendations and is agreeable with plan.

## 2018-10-13 ENCOUNTER — Telehealth: Payer: Self-pay | Admitting: Neurology

## 2018-10-13 NOTE — Telephone Encounter (Signed)
Tried calling pt back. Phone continued to ring

## 2018-10-13 NOTE — Telephone Encounter (Signed)
Pt called stating that while taking 2 tablets of LORazepam (ATIVAN) 1 MG tablet around bed time at 10pm she is now waking up around 2am and staying awake for the rest of the day. Requesting a call to advise

## 2018-10-14 ENCOUNTER — Other Ambulatory Visit: Payer: Self-pay | Admitting: Internal Medicine

## 2018-10-14 NOTE — Telephone Encounter (Signed)
Pt's pharmacy is requesting a refill on these medications. Please clarify which medications that pt is suppose to be taking. Please address

## 2018-10-14 NOTE — Telephone Encounter (Signed)
I called patient again. She stated she is taking lorazepam 1mg  tab (2 tabs qhs) with the Belsomra 20mg  qhs. She is still waking up between 2:00-2:30am and when she takes 2 tabs of lorazepam (total 3mg ) qhs with Belsomra she wakes up around 4-4:30am. She is wanting to know if Dr. Felecia Shelling can adjust her lorazepam dose. She is not able to sleep with current dose. Advised he will be back on Friday. Out of the office currently. She wants to wait until he gets back to address. I did offer to ask WID. She declined. Advised once we speak with him, we will call her back. She verbalized understanding.

## 2018-10-14 NOTE — Telephone Encounter (Signed)
Correct Pt medication list.    Pt taking cardizem CD 180 mg--Take 2 tablets by mouth daily.  Sent to pharmacy as requested.

## 2018-10-15 ENCOUNTER — Other Ambulatory Visit: Payer: Self-pay

## 2018-10-15 MED ORDER — DILTIAZEM HCL ER BEADS 360 MG PO CP24: 360.0000 mg | ORAL_CAPSULE | Freq: Every day | ORAL | 3 refills | Status: DC

## 2018-10-15 NOTE — Progress Notes (Signed)
Changed to diltiazem ER per request of [facility].

## 2018-10-17 ENCOUNTER — Other Ambulatory Visit: Payer: Self-pay | Admitting: Internal Medicine

## 2018-10-17 MED ORDER — CLONAZEPAM 1 MG PO TABS: ORAL_TABLET | ORAL | 0 refills | Status: DC

## 2018-10-17 NOTE — Telephone Encounter (Signed)
Spoke to patient - she is agreeable to change to clonazepam 2mg .  I called [facility] and voided the remaining lorazepam refills on file.  She will continue her Belsomra.  She is willing to be referred for cognitive behavioral therapy if these medication changes are not helpful.

## 2018-10-17 NOTE — Addendum Note (Signed)
Addended by: Noberto Retort C on: 10/17/2018 12:32 PM   Modules accepted: Orders

## 2018-10-17 NOTE — Telephone Encounter (Signed)
Lets change the lorazepam to clonazepam 2 mg and keep the Belsomra.   If that does not help, we will refer to Behavioral health for cognitive behavioral therapy

## 2018-10-17 NOTE — Addendum Note (Signed)
Addended by: Arlice Colt A on: 10/17/2018 02:49 PM   Modules accepted: Orders

## 2018-10-20 ENCOUNTER — Ambulatory Visit (INDEPENDENT_AMBULATORY_CARE_PROVIDER_SITE_OTHER): Payer: Medicare Other

## 2018-10-20 DIAGNOSIS — I480 Paroxysmal atrial fibrillation: Secondary | ICD-10-CM

## 2018-10-20 NOTE — Progress Notes (Signed)
Carelink Summary Report / Loop Recorder 

## 2018-10-21 ENCOUNTER — Other Ambulatory Visit: Payer: Self-pay

## 2018-10-21 NOTE — Telephone Encounter (Signed)
Spoke to patient - she started the clonazepam 1mg , two tablets on 10/17/18 and only woke up once during the night.  The following two nights, she was up every couple of hours again.  Last night, she decided to take one tablet of her clonazepam 1mg  and one tablet of lorazepam 1mg .  States she slept from 10pm to 5am.

## 2018-10-21 NOTE — Telephone Encounter (Signed)
Per vo by Dr. Felecia Shelling, she should not take both clonazepam and lorazepam.  She should try clonazepam 1mg , 1-2 tablets at bedtime a little longer.  She should use this medication in addition to Belsomra 20mg  at bedtime.  She was instructed to call back if her insomnia does not improve.  Dr. Felecia Shelling plans to refer her to behavioral health for cognitive behavioral therapy for the next step.  The patient is agreeable to this plan.  If she does end up needing the referral, she would like to see someone as close to her home as possible.  She lives in [place].

## 2018-10-21 NOTE — Telephone Encounter (Signed)
Pts requesting a call from RN to discuss how medication clonazePAM (KLONOPIN) 1 MG tablet is going

## 2018-11-01 DIAGNOSIS — I129 Hypertensive chronic kidney disease with stage 1 through stage 4 chronic kidney disease, or unspecified chronic kidney disease: Secondary | ICD-10-CM | POA: Diagnosis not present

## 2018-11-01 DIAGNOSIS — K745 Biliary cirrhosis, unspecified: Secondary | ICD-10-CM | POA: Diagnosis not present

## 2018-11-01 DIAGNOSIS — N183 Chronic kidney disease, stage 3 (moderate): Secondary | ICD-10-CM | POA: Diagnosis not present

## 2018-11-01 DIAGNOSIS — I700 Atherosclerosis of aorta: Secondary | ICD-10-CM | POA: Diagnosis not present

## 2018-11-03 ENCOUNTER — Other Ambulatory Visit: Payer: Self-pay | Admitting: Neurology

## 2018-11-03 NOTE — Telephone Encounter (Signed)
Called and spoke with patient. Clonazepam 1mg  tab (2 tabs qhs) working well. Advised too early for refill request. She will contact pharmacy once it gets closer to needing refill. Nothing further needed.

## 2018-11-03 NOTE — Telephone Encounter (Signed)
Patient calling to request refill ofclonazePAM (KLONOPIN) 1 MG tablet sent to [facility] in [place]. She would like a call back to her to let you know that this medication is working.

## 2018-11-06 DIAGNOSIS — K745 Biliary cirrhosis, unspecified: Secondary | ICD-10-CM | POA: Diagnosis not present

## 2018-11-07 ENCOUNTER — Ambulatory Visit: Payer: Medicare Other | Admitting: Neurology

## 2018-11-10 ENCOUNTER — Other Ambulatory Visit: Payer: Self-pay | Admitting: Neurology

## 2018-11-13 DIAGNOSIS — R194 Change in bowel habit: Secondary | ICD-10-CM | POA: Diagnosis not present

## 2018-11-13 DIAGNOSIS — R159 Full incontinence of feces: Secondary | ICD-10-CM | POA: Diagnosis not present

## 2018-11-13 DIAGNOSIS — K743 Primary biliary cirrhosis: Secondary | ICD-10-CM | POA: Diagnosis not present

## 2018-11-17 ENCOUNTER — Telehealth: Payer: Self-pay | Admitting: Neurology

## 2018-11-17 DIAGNOSIS — R194 Change in bowel habit: Secondary | ICD-10-CM | POA: Diagnosis not present

## 2018-11-17 MED ORDER — CLONAZEPAM 1 MG PO TABS: ORAL_TABLET | ORAL | 0 refills | Status: DC

## 2018-11-17 NOTE — Telephone Encounter (Signed)
Pt is asking for a call from [place] as to why she is unable to now get her clonazePAM (KLONOPIN) 1 MG tablet filled, please call

## 2018-11-17 NOTE — Addendum Note (Signed)
Addended by: Rossie Muskrat L on: 11/17/2018 11:28 AM   Modules accepted: Orders

## 2018-11-18 NOTE — Telephone Encounter (Signed)
ERROR

## 2018-11-21 ENCOUNTER — Ambulatory Visit (INDEPENDENT_AMBULATORY_CARE_PROVIDER_SITE_OTHER): Payer: Medicare Other

## 2018-11-21 DIAGNOSIS — I480 Paroxysmal atrial fibrillation: Secondary | ICD-10-CM | POA: Diagnosis not present

## 2018-11-21 NOTE — Progress Notes (Signed)
Carelink Summary Report / Loop Recorder 

## 2018-12-02 DIAGNOSIS — N183 Chronic kidney disease, stage 3 (moderate): Secondary | ICD-10-CM | POA: Diagnosis not present

## 2018-12-02 DIAGNOSIS — I700 Atherosclerosis of aorta: Secondary | ICD-10-CM | POA: Diagnosis not present

## 2018-12-02 DIAGNOSIS — K745 Biliary cirrhosis, unspecified: Secondary | ICD-10-CM | POA: Diagnosis not present

## 2018-12-02 DIAGNOSIS — I129 Hypertensive chronic kidney disease with stage 1 through stage 4 chronic kidney disease, or unspecified chronic kidney disease: Secondary | ICD-10-CM | POA: Diagnosis not present

## 2018-12-07 LAB — CUP PACEART REMOTE DEVICE CHECK
Implantable Pulse Generator Implant Date: 20191015
MDC IDC SESS DTM: 20191117110648

## 2018-12-10 ENCOUNTER — Ambulatory Visit: Payer: Medicare Other | Admitting: Neurology

## 2018-12-10 DIAGNOSIS — L0201 Cutaneous abscess of face: Secondary | ICD-10-CM | POA: Diagnosis not present

## 2018-12-10 DIAGNOSIS — L0390 Cellulitis, unspecified: Secondary | ICD-10-CM | POA: Diagnosis not present

## 2018-12-11 DIAGNOSIS — J3489 Other specified disorders of nose and nasal sinuses: Secondary | ICD-10-CM | POA: Diagnosis not present

## 2018-12-11 DIAGNOSIS — J310 Chronic rhinitis: Secondary | ICD-10-CM | POA: Diagnosis not present

## 2018-12-11 DIAGNOSIS — J342 Deviated nasal septum: Secondary | ICD-10-CM | POA: Diagnosis not present

## 2018-12-11 DIAGNOSIS — Z8614 Personal history of Methicillin resistant Staphylococcus aureus infection: Secondary | ICD-10-CM | POA: Diagnosis not present

## 2018-12-17 DIAGNOSIS — K745 Biliary cirrhosis, unspecified: Secondary | ICD-10-CM | POA: Diagnosis not present

## 2018-12-21 LAB — CUP PACEART REMOTE DEVICE CHECK
Implantable Pulse Generator Implant Date: 20191015
MDC IDC SESS DTM: 20191220134009

## 2018-12-24 ENCOUNTER — Ambulatory Visit (INDEPENDENT_AMBULATORY_CARE_PROVIDER_SITE_OTHER): Payer: Medicare Other

## 2018-12-24 DIAGNOSIS — I480 Paroxysmal atrial fibrillation: Secondary | ICD-10-CM | POA: Diagnosis not present

## 2018-12-24 DIAGNOSIS — R002 Palpitations: Secondary | ICD-10-CM

## 2018-12-25 LAB — CUP PACEART REMOTE DEVICE CHECK
MDC IDC PG IMPLANT DT: 20191015
MDC IDC SESS DTM: 20200122134008

## 2018-12-25 NOTE — Progress Notes (Signed)
Carelink Summary Report / Loop Recorder 

## 2018-12-26 ENCOUNTER — Other Ambulatory Visit: Payer: Self-pay | Admitting: Gastroenterology

## 2018-12-26 DIAGNOSIS — K743 Primary biliary cirrhosis: Secondary | ICD-10-CM

## 2018-12-31 ENCOUNTER — Encounter: Payer: Self-pay | Admitting: Internal Medicine

## 2018-12-31 ENCOUNTER — Ambulatory Visit (INDEPENDENT_AMBULATORY_CARE_PROVIDER_SITE_OTHER): Payer: Medicare Other | Admitting: Internal Medicine

## 2018-12-31 VITALS — BP 106/62 | HR 87 | Ht 64.0 in | Wt 119.4 lb

## 2018-12-31 DIAGNOSIS — R002 Palpitations: Secondary | ICD-10-CM | POA: Diagnosis not present

## 2018-12-31 DIAGNOSIS — I480 Paroxysmal atrial fibrillation: Secondary | ICD-10-CM | POA: Diagnosis not present

## 2018-12-31 DIAGNOSIS — I10 Essential (primary) hypertension: Secondary | ICD-10-CM

## 2018-12-31 NOTE — Progress Notes (Signed)
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  Since last being seen in our clinic, the patient reports doing reasonably well.  Today, she denies symptoms of palpitations, chest pain, shortness of breath,  lower extremity edema, dizziness, presyncope, or syncope.  The patient is otherwise without complaint today.   Past Medical History:  Diagnosis Date  . Atrial fibrillation (HCC)    post-operative   . Hepatitis   . Hyperlipidemia   . Hypertension   . Premature atrial contractions    systomatic PAC's and documented SVT ( likley atrial tachycardia )  . Stroke (cerebrum) [facility])    Past Surgical History:  Procedure Laterality Date  . LOOP RECORDER INSERTION N/A 09/16/2018   Procedure: LOOP RECORDER INSERTION;  Surgeon: Thompson Grayer, MD;  Location: [facility];  Service: Cardiovascular;  Laterality: N/A;  . LUMBAR LAMINECTOMY/DECOMPRESSION MICRODISCECTOMY  02/04/2011   L2-L3 and L3-L4 with diskectomy and plating    ROS- all systems are reviewed and negatives except as per HPI above  Current Outpatient Medications  Medication Sig Dispense Refill  . acetaminophen (TYLENOL) 500 MG tablet Take 1,500 mg by mouth daily as needed for moderate pain.    Marland Kitchen acyclovir (ZOVIRAX) 200 MG capsule Take 200 mg by mouth 2 (two) times daily.     . clonazePAM (KLONOPIN) 1 MG tablet Take one or two po qHS 60 tablet 0  . diltiazem (TIAZAC) 360 MG 24 hr capsule Take 1 capsule (360 mg total) by mouth daily. 90 capsule 3  . ELIQUIS 5 MG TABS tablet TAKE 1 TABLET(5 MG) BY MOUTH TWICE DAILY (Patient taking differently: Take 5 mg by mouth 2 (two) times daily. ) 60 tablet 6  . ibuprofen (ADVIL,MOTRIN) 200 MG tablet Take 600-800 mg by mouth daily as needed (back pain).    Marland Kitchen losartan (COZAAR) 25 MG tablet Take 25 mg by mouth daily.    . multivitamin (THERAGRAN) per tablet Take 1 tablet by mouth 2 (two) times a week.     . nitroGLYCERIN  (NITROSTAT) 0.4 MG SL tablet Place 1 tablet under the tongue every 5 minutes. Max 3 doses. If no relief after 3 doses, call 911 25 tablet 3  . ranitidine (ZANTAC) 150 MG tablet Take 150 mg by mouth daily as needed for heartburn.     . simvastatin (ZOCOR) 20 MG tablet Take 20 mg by mouth every evening.     . Suvorexant (BELSOMRA) 20 MG TABS Take 20 mg by mouth at bedtime. 30 tablet 5  . ursodiol (ACTIGALL) 300 MG capsule Take 300 mg by mouth 2 (two) times daily.       No current facility-administered medications for this visit.     Physical Exam: Vitals:   12/31/18 1410  BP: 106/62  Pulse: 87  SpO2: 99%  Weight: 119 lb 6.4 oz (54.2 kg)  Height: 5\' 4"  (1.626 m)    GEN- The patient is well appearing, alert and oriented x 3 today.   Head- normocephalic, atraumatic Eyes-  Sclera clear, conjunctiva pink Ears- hearing intact Oropharynx- clear Lungs- Clear to ausculation bilaterally, normal work of breathing Heart- Regular rate and rhythm, no murmurs, rubs or gallops, PMI not laterally displaced GI- soft, NT, ND, + BS Extremities- no clubbing, cyanosis, or edema  Wt Readings from Last 3 Encounters:  12/31/18 119 lb 6.4 oz (54.2 kg)  09/17/18 116 lb 8 oz (52.8 kg)  09/16/18 114 lb 13.8 oz (52.1 kg)  EKG tracing ordered today is personally reviewed and shows sinus rhythm  Assessment and Plan:  1. afib S/p recent ILR implantation ILR interrogation is personally reviewed and reveals afib burden of 0.5% On eliquis for chads2vasc score of 5  2. HTN Stable No change required today  3. Tobacco Remains quit x over 2 years!  carelink Return to see me in 6 months  Thompson Grayer MD, [facility] 12/31/2018 2:13 PM

## 2018-12-31 NOTE — Patient Instructions (Addendum)
Medication Instructions:  Your physician recommends that you continue on your current medications as directed. Please refer to the Current Medication list given to you today.  Labwork: None ordered.  Testing/Procedures: None ordered.  Follow-Up: Your physician wants you to follow-up in: 9 months with Dr. Rayann Heman.      Monthly remote monitoring  Any Other Special Instructions Will Be Listed Below (If Applicable).  If you need a refill on your cardiac medications before your next appointment, please call your pharmacy.

## 2019-01-01 LAB — CUP PACEART INCLINIC DEVICE CHECK
Implantable Pulse Generator Implant Date: 20191015
MDC IDC SESS DTM: 20200129201215

## 2019-01-02 ENCOUNTER — Ambulatory Visit
Admission: RE | Admit: 2019-01-02 | Discharge: 2019-01-02 | Disposition: A | Discharge status: Home / Self Care | Payer: Medicare Other | Source: Ambulatory Visit | Attending: Gastroenterology | Admitting: Gastroenterology

## 2019-01-02 DIAGNOSIS — N183 Chronic kidney disease, stage 3 (moderate): Secondary | ICD-10-CM | POA: Diagnosis not present

## 2019-01-02 DIAGNOSIS — I700 Atherosclerosis of aorta: Secondary | ICD-10-CM | POA: Diagnosis not present

## 2019-01-02 DIAGNOSIS — K745 Biliary cirrhosis, unspecified: Secondary | ICD-10-CM | POA: Diagnosis not present

## 2019-01-02 DIAGNOSIS — I129 Hypertensive chronic kidney disease with stage 1 through stage 4 chronic kidney disease, or unspecified chronic kidney disease: Secondary | ICD-10-CM | POA: Diagnosis not present

## 2019-01-02 DIAGNOSIS — K743 Primary biliary cirrhosis: Secondary | ICD-10-CM

## 2019-01-02 MED ORDER — IOPAMIDOL (ISOVUE-300) INJECTION 61%
100.0000 mL | Freq: Once | INTRAVENOUS | Status: AC | PRN
  Administered 2019-01-02: 100 mL via INTRAVENOUS

## 2019-01-06 DIAGNOSIS — E782 Mixed hyperlipidemia: Secondary | ICD-10-CM | POA: Diagnosis not present

## 2019-01-06 DIAGNOSIS — I129 Hypertensive chronic kidney disease with stage 1 through stage 4 chronic kidney disease, or unspecified chronic kidney disease: Secondary | ICD-10-CM | POA: Diagnosis not present

## 2019-01-13 DIAGNOSIS — K754 Autoimmune hepatitis: Secondary | ICD-10-CM | POA: Diagnosis not present

## 2019-01-16 DIAGNOSIS — N183 Chronic kidney disease, stage 3 (moderate): Secondary | ICD-10-CM | POA: Diagnosis not present

## 2019-01-16 DIAGNOSIS — I129 Hypertensive chronic kidney disease with stage 1 through stage 4 chronic kidney disease, or unspecified chronic kidney disease: Secondary | ICD-10-CM | POA: Diagnosis not present

## 2019-01-16 DIAGNOSIS — K754 Autoimmune hepatitis: Secondary | ICD-10-CM | POA: Diagnosis not present

## 2019-01-16 DIAGNOSIS — R232 Flushing: Secondary | ICD-10-CM | POA: Diagnosis not present

## 2019-01-16 NOTE — Progress Notes (Deleted)
PATIENT: Abigail Sullivan DOB: 1946-07-19  REASON FOR VISIT: follow up HISTORY FROM: patient  No chief complaint on file.    HISTORY OF PRESENT ILLNESS: Today 01/16/19 Abigail Sullivan is a 73 y.o. female here today for follow up. She is taking clonazepam 1mg  and Belsomra 20mg  every night.   HISTORY: (copied from Dr Garth Bigness note on 09/17/2018) Abigail Sullivan is a 73 year old woman with A. fib and strokes.   She also has severe insomnia.  Update 09/17/2018: She had 2 strokes.    One was left occipital and has led to the RUQ quadrantopsia.  Another is right frontoparietal.    I reviewed her MRI form 08/19/18 and it showed her old CVA';s and microvascular ischemic change but no acute findings.   MRA showed no significant blockage.   We discussed that the AFIb likely caused her strokes in the past.     She is having more trouble sleeping.    She has tried and failed multiple medications.    She has both sleep maintenance and sleep onset insomnia.    Ambien caused a rash and did not help.   Belsomra 20 mg and lorazepam 2 mg she falls asleep and stays asleep.    Lunesta 3 mg and lorazepam has also helped.   Quetiapine helps restlessness but not sleep.   Temazepam had not helped.   Trazodone even at 200 mg did not help.    Flurazepam did not help as well as the combinations above.    Sleep worsened after her strokes.     She does use good sleep hygiene.     She feels like she is going to faint with frequent lightheaded spells and sometimes vertigo.    That led to MRIs 08/19/2018 that showed no acute findings.   She now has a loop recorder   From 10/14/2017 She is a 73 year old woman with chronic paroxysmal atrial fibrillation who has had 2 strokes, one in the posterior right frontal lobe and one in the medial occipital lobe.    She has been on Eliquis for the past year.   She is very compliant and has not missed any doses.     Since the spring, she has had some difficulties with vision,  restlessness and  she notes lightheadedness when she is standing up.   She feels unsteady but does not note vertigo.    She feels more unsteady if she tries to turn.   This occurs the entire time she is standing up and never experiences it with sitting or laying down.     She notes the onset is immediate upon standing.     The restlessness involves fidgeting. When she is sitting or laying down,  after a while, she feels she has to get up and stand. When she is standing, after a while, she feels she needs to sit.   This usually happens after a minute or 2.  She feels her balance is worse.    She has not fallen.     She notes coordination is worse -- with both Krehbiel and fine motor tasks like laundry, writing, buttoning.  She does not note any change in her muscle tone. There is no tremor.  She feels vision is not clear and has a haze or fuzz around every item.     She has seen ophthalmology and they noted that in August visual fields were fine but in October she had reduced RUQ visual field vision.  She often feels an urge to stand up and walk but does not note much RLS when laying.    She sleeps well at night on the combination of olanzepine an temazepam (she sees Dr. Casimiro Needle).   She has been off the olanzepine a few times for a day or two and sleep is worse but restlessness is not any better.    She had chest discomfort a couple months ago and went to the ED.    Her diltiazem was increased from 240 to 360 mg.    I personally reviewed the 10/10/2017 and 08/19/2017 CT scans of the head. They show 2 wedge-shaped remote infarctions in the posterior right frontal lobe and the medial left occipital lobe.    There is no difference between the 2 scans  REVIEW OF SYSTEMS: Out of a complete 14 system review of symptoms, the patient complains only of the following symptoms, and all other reviewed systems are negative.  ALLERGIES: Allergies  Allergen Reactions  . Lisinopril Cough  . Lipitor  [Atorvastatin]     Myalgias    HOME MEDICATIONS: Outpatient Medications Prior to Visit  Medication Sig Dispense Refill  . acetaminophen (TYLENOL) 500 MG tablet Take 1,500 mg by mouth daily as needed for moderate pain.    Marland Kitchen acyclovir (ZOVIRAX) 200 MG capsule Take 200 mg by mouth 2 (two) times daily.     . clonazePAM (KLONOPIN) 1 MG tablet Take one or two po qHS 60 tablet 0  . diltiazem (TIAZAC) 360 MG 24 hr capsule Take 1 capsule (360 mg total) by mouth daily. 90 capsule 3  . ELIQUIS 5 MG TABS tablet TAKE 1 TABLET(5 MG) BY MOUTH TWICE DAILY (Patient taking differently: Take 5 mg by mouth 2 (two) times daily. ) 60 tablet 6  . ibuprofen (ADVIL,MOTRIN) 200 MG tablet Take 600-800 mg by mouth daily as needed (back pain).    Marland Kitchen losartan (COZAAR) 25 MG tablet Take 25 mg by mouth daily.    . multivitamin (THERAGRAN) per tablet Take 1 tablet by mouth 2 (two) times a week.     . nitroGLYCERIN (NITROSTAT) 0.4 MG SL tablet Place 1 tablet under the tongue every 5 minutes. Max 3 doses. If no relief after 3 doses, call 911 25 tablet 3  . ranitidine (ZANTAC) 150 MG tablet Take 150 mg by mouth daily as needed for heartburn.     . simvastatin (ZOCOR) 20 MG tablet Take 20 mg by mouth every evening.     . Suvorexant (BELSOMRA) 20 MG TABS Take 20 mg by mouth at bedtime. 30 tablet 5  . ursodiol (ACTIGALL) 300 MG capsule Take 300 mg by mouth 2 (two) times daily.       No facility-administered medications prior to visit.     PAST MEDICAL HISTORY: Past Medical History:  Diagnosis Date  . Atrial fibrillation (HCC)    post-operative   . Hepatitis   . Hyperlipidemia   . Hypertension   . Premature atrial contractions    systomatic PAC's and documented SVT ( likley atrial tachycardia )  . Stroke (cerebrum) ([place])     PAST SURGICAL HISTORY: Past Surgical History:  Procedure Laterality Date  . LOOP RECORDER INSERTION N/A 09/16/2018   Procedure: LOOP RECORDER INSERTION;  Surgeon: Thompson Grayer, MD;  Location:  [facility];  Service: Cardiovascular;  Laterality: N/A;  . LUMBAR LAMINECTOMY/DECOMPRESSION MICRODISCECTOMY  02/04/2011   L2-L3 and L3-L4 with diskectomy and plating    FAMILY HISTORY: Family History  Problem Relation  Age of Onset  . Other Father        CABGx4    SOCIAL HISTORY: Social History   Socioeconomic History  . Marital status: Married    Spouse name: Not on file  . Number of children: Not on file  . Years of education: Not on file  . Highest education level: Not on file  Occupational History  . Not on file  Social Needs  . Financial resource strain: Not on file  . Food insecurity:    Worry: Not on file    Inability: Not on file  . Transportation needs:    Medical: Not on file    Non-medical: Not on file  Tobacco Use  . Smoking status: Former Smoker    Packs/day: 0.30    Types: Cigarettes    Last attempt to quit: 12/22/2016    Years since quitting: 2.0  . Smokeless tobacco: Never Used  . Tobacco comment: smokes 3 cigarettes per day, not interested in quitting  Substance and Sexual Activity  . Alcohol use: No  . Drug use: No  . Sexual activity: Not on file  Lifestyle  . Physical activity:    Days per week: Not on file    Minutes per session: Not on file  . Stress: Not on file  Relationships  . Social connections:    Talks on phone: Not on file    Gets together: Not on file    Attends religious service: Not on file    Active member of club or organization: Not on file    Attends meetings of clubs or organizations: Not on file    Relationship status: Not on file  . Intimate partner violence:    Fear of current or ex partner: Not on file    Emotionally abused: Not on file    Physically abused: Not on file    Forced sexual activity: Not on file  Other Topics Concern  . Not on file  Social History Narrative  . Not on file      PHYSICAL EXAM  There were no vitals filed for this visit. There is no height or weight on file to calculate  BMI.  Generalized: Well developed, in no acute distress  Cardiology: normal rate and rhythm, no murmur noted Neurological examination  Mentation: Alert oriented to time, place, history taking. Follows all commands speech and language fluent Cranial nerve II-XII: Pupils were equal round reactive to light. Extraocular movements were full, visual field were full on confrontational test. Facial sensation and strength were normal. Uvula tongue midline. Head turning and shoulder shrug  were normal and symmetric. Motor: The motor testing reveals 5 over 5 strength of all 4 extremities. Good symmetric motor tone is noted throughout.  Sensory: Sensory testing is intact to soft touch on all 4 extremities. No evidence of extinction is noted.  Coordination: Cerebellar testing reveals good finger-nose-finger and heel-to-shin bilaterally.  Gait and station: Gait is normal. Tandem gait is normal. Romberg is negative. No drift is seen.  Reflexes: Deep tendon reflexes are symmetric and normal bilaterally.   DIAGNOSTIC DATA (LABS, IMAGING, TESTING) - I reviewed patient records, labs, notes, testing and imaging myself where available.  No flowsheet data found.   Lab Results  Component Value Date   WBC 7.4 09/10/2018   HGB 13.6 09/10/2018   HCT 40.2 09/10/2018   MCV 90 09/10/2018   PLT 353 09/10/2018      Component Value Date/Time   NA 140 09/10/2018 1031  K 4.4 09/10/2018 1031   CL 100 09/10/2018 1031   CO2 22 09/10/2018 1031   GLUCOSE 91 09/10/2018 1031   GLUCOSE 93 06/11/2016 1028   BUN 16 09/10/2018 1031   CREATININE 0.96 09/10/2018 1031   CREATININE 1.10 (H) 06/11/2016 1028   CALCIUM 9.8 09/10/2018 1031   PROT 7.6 09/10/2018 1031   ALBUMIN 4.3 09/10/2018 1031   AST 23 09/10/2018 1031   ALT 20 09/10/2018 1031   ALKPHOS 225 (H) 09/10/2018 1031   BILITOT 0.3 09/10/2018 1031   GFRNONAA 59 (L) 09/10/2018 1031   GFRAA 68 09/10/2018 1031   Lab Results  Component Value Date   CHOL 151  09/10/2018   HDL 62 09/10/2018   LDLCALC 64 09/10/2018   TRIG 124 09/10/2018   CHOLHDL 2.4 09/10/2018   No results found for: HGBA1C No results found for: VITAMINB12 Lab Results  Component Value Date   TSH 4.459 ***Test methodology is 3rd generation TSH*** 02/04/2009       ASSESSMENT AND PLAN 73 y.o. year old female  has a past medical history of Atrial fibrillation ([place]), Hepatitis, Hyperlipidemia, Hypertension, Premature atrial contractions, and Stroke (cerebrum) ([place]). here with ***    ICD-10-CM   1. Insomnia, unspecified type G47.00   2. History of CVA (cerebrovascular accident) Z86.73        No orders of the defined types were placed in this encounter.    No orders of the defined types were placed in this encounter.     I spent 15 minutes with the patient. 50% of this time was spent counseling and educating patient on plan of care and medications.    Debbora Presto, FNP-C 01/16/2019, 9:48 AM [facility] [address], [place],  [address] [phone]

## 2019-01-19 ENCOUNTER — Ambulatory Visit: Payer: Medicare Other | Admitting: Family Medicine

## 2019-01-19 ENCOUNTER — Ambulatory Visit: Payer: Medicare Other | Admitting: Nurse Practitioner

## 2019-01-20 ENCOUNTER — Other Ambulatory Visit: Payer: Self-pay | Admitting: Internal Medicine

## 2019-01-22 ENCOUNTER — Ambulatory Visit (INDEPENDENT_AMBULATORY_CARE_PROVIDER_SITE_OTHER): Payer: Medicare Other | Admitting: Family Medicine

## 2019-01-22 ENCOUNTER — Encounter: Payer: Self-pay | Admitting: Family Medicine

## 2019-01-22 VITALS — BP 128/79 | HR 93 | Ht 64.0 in | Wt 119.0 lb

## 2019-01-22 DIAGNOSIS — H547 Unspecified visual loss: Secondary | ICD-10-CM

## 2019-01-22 DIAGNOSIS — G4700 Insomnia, unspecified: Secondary | ICD-10-CM

## 2019-01-22 DIAGNOSIS — R42 Dizziness and giddiness: Secondary | ICD-10-CM | POA: Diagnosis not present

## 2019-01-22 DIAGNOSIS — I69398 Other sequelae of cerebral infarction: Secondary | ICD-10-CM | POA: Diagnosis not present

## 2019-01-22 NOTE — Patient Instructions (Signed)
Consider vestibular therapy with PT Follow up closely with PCP for dizziness Make sure to stay well hydrated.   Dizziness Dizziness is a common problem. It is a feeling of unsteadiness or light-headedness. You may feel like you are about to faint. Dizziness can lead to injury if you stumble or fall. Anyone can become dizzy, but dizziness is more common in older adults. This condition can be caused by a number of things, including medicines, dehydration, or illness. Follow these instructions at home: Eating and drinking  Drink enough fluid to keep your urine clear or pale yellow. This helps to keep you from becoming dehydrated. Try to drink more clear fluids, such as water.  Do not drink alcohol.  Limit your caffeine intake if told to do so by your health care provider. Check ingredients and nutrition facts to see if a food or beverage contains caffeine.  Limit your salt (sodium) intake if told to do so by your health care provider. Check ingredients and nutrition facts to see if a food or beverage contains sodium. Activity  Avoid making quick movements. ? Rise slowly from chairs and steady yourself until you feel okay. ? In the morning, first sit up on the side of the bed. When you feel okay, stand slowly while you hold onto something until you know that your balance is fine.  If you need to stand in one place for a long time, move your legs often. Tighten and relax the muscles in your legs while you are standing.  Do not drive or use heavy machinery if you feel dizzy.  Avoid bending down if you feel dizzy. Place items in your home so that they are easy for you to reach without leaning over. Lifestyle  Do not use any products that contain nicotine or tobacco, such as cigarettes and e-cigarettes. If you need help quitting, ask your health care provider.  Try to reduce your stress level by using methods such as yoga or meditation. Talk with your health care provider if you need help to  manage your stress. General instructions  Watch your dizziness for any changes.  Take over-the-counter and prescription medicines only as told by your health care provider. Talk with your health care provider if you think that your dizziness is caused by a medicine that you are taking.  Tell a friend or a family member that you are feeling dizzy. If he or she notices any changes in your behavior, have this person call your health care provider.  Keep all follow-up visits as told by your health care provider. This is important. Contact a health care provider if:  Your dizziness does not go away.  Your dizziness or light-headedness gets worse.  You feel nauseous.  You have reduced hearing.  You have new symptoms.  You are unsteady on your feet or you feel like the room is spinning. Get help right away if:  You vomit or have diarrhea and are unable to eat or drink anything.  You have problems talking, walking, swallowing, or using your arms, hands, or legs.  You feel generally weak.  You are not thinking clearly or you have trouble forming sentences. It may take a friend or family member to notice this.  You have chest pain, abdominal pain, shortness of breath, or sweating.  Your vision changes.  You have any bleeding.  You have a severe headache.  You have neck pain or a stiff neck.  You have a fever. These symptoms may represent a  serious problem that is an emergency. Do not wait to see if the symptoms will go away. Get medical help right away. Call your local emergency services (911 in the [place] Do not drive yourself to the hospital. Summary  Dizziness is a feeling of unsteadiness or light-headedness. This condition can be caused by a number of things, including medicines, dehydration, or illness.  Anyone can become dizzy, but dizziness is more common in older adults.  Drink enough fluid to keep your urine clear or pale yellow. Do not drink alcohol.  Avoid making  quick movements if you feel dizzy. Monitor your dizziness for any changes. This information is not intended to replace advice given to you by your health care provider. Make sure you discuss any questions you have with your health care provider. Document Released: 05/15/2001 Document Revised: 12/22/2016 Document Reviewed: 12/22/2016 Elsevier Interactive Patient Education  2019 [place] Prevention in the Home, Adult Falls can cause injuries. They can happen to people of all ages. There are many things you can do to make your home safe and to help prevent falls. Ask for help when making these changes, if needed. What actions can I take to prevent falls? General Instructions Use good lighting in all rooms. Replace any light bulbs that burn out. Turn on the lights when you go into a dark area. Use night-lights. Keep items that you use often in easy-to-reach places. Lower the shelves around your home if necessary. Set up your furniture so you have a clear path. Avoid moving your furniture around. Do not have throw rugs and other things on the floor that can make you trip. Avoid walking on wet floors. If any of your floors are uneven, fix them. Add color or contrast paint or tape to clearly mark and help you see: Any grab bars or handrails. First and last steps of stairways. Where the edge of each step is. If you use a stepladder: Make sure that it is fully opened. Do not climb a closed stepladder. Make sure that both sides of the stepladder are locked into place. Ask someone to hold the stepladder for you while you use it. If there are any pets around you, be aware of where they are. What can I do in the bathroom?     Keep the floor dry. Clean up any water that spills onto the floor as soon as it happens. Remove soap buildup in the tub or shower regularly. Use non-skid mats or decals on the floor of the tub or shower. Attach bath mats securely with double-sided, non-slip rug  tape. If you need to sit down in the shower, use a plastic, non-slip stool. Install grab bars by the toilet and in the tub and shower. Do not use towel bars as grab bars. What can I do in the bedroom? Make sure that you have a light by your bed that is easy to reach. Do not use any sheets or blankets that are too big for your bed. They should not hang down onto the floor. Have a firm chair that has side arms. You can use this for support while you get dressed. What can I do in the kitchen? Clean up any spills right away. If you need to reach something above you, use a strong step stool that has a grab bar. Keep electrical cords out of the way. Do not use floor polish or wax that makes floors slippery. If you must use wax, use non-skid floor wax.  What can I do with my stairs? Do not leave any items on the stairs. Make sure that you have a light switch at the top of the stairs and the bottom of the stairs. If you do not have them, ask someone to add them for you. Make sure that there are handrails on both sides of the stairs, and use them. Fix handrails that are broken or loose. Make sure that handrails are as long as the stairways. Install non-slip stair treads on all stairs in your home. Avoid having throw rugs at the top or bottom of the stairs. If you do have throw rugs, attach them to the floor with carpet tape. Choose a carpet that does not hide the edge of the steps on the stairway. Check any carpeting to make sure that it is firmly attached to the stairs. Fix any carpet that is loose or worn. What can I do on the outside of my home? Use bright outdoor lighting. Regularly fix the edges of walkways and driveways and fix any cracks. Remove anything that might make you trip as you walk through a door, such as a raised step or threshold. Trim any bushes or trees on the path to your home. Regularly check to see if handrails are loose or broken. Make sure that both sides of any steps have  handrails. Install guardrails along the edges of any raised decks and porches. Clear walking paths of anything that might make someone trip, such as tools or rocks. Have any leaves, snow, or ice cleared regularly. Use sand or salt on walking paths during winter. Clean up any spills in your garage right away. This includes grease or oil spills. What other actions can I take? Wear shoes that: Have a low heel. Do not wear high heels. Have rubber bottoms. Are comfortable and fit you well. Are closed at the toe. Do not wear open-toe sandals. Use tools that help you move around (mobility aids) if they are needed. These include: Canes. Walkers. Scooters. Crutches. Review your medicines with your doctor. Some medicines can make you feel dizzy. This can increase your chance of falling. Ask your doctor what other things you can do to help prevent falls. Where to find more information Centers for Disease Control and Prevention, [employer]: https://garcia.biz/ [employer] on Aging: BrainJudge.co.uk Contact a doctor if: You are afraid of falling at home. You feel weak, drowsy, or dizzy at home. You fall at home. Summary There are many simple things that you can do to make your home safe and to help prevent falls. Ways to make your home safe include removing tripping hazards and installing grab bars in the bathroom. Ask for help when making these changes in your home. This information is not intended to replace advice given to you by your health care provider. Make sure you discuss any questions you have with your health care provider. Document Released: 09/15/2009 Document Revised: 07/04/2017 Document Reviewed: 07/04/2017 Elsevier Interactive Patient Education  2019 [employer].

## 2019-01-22 NOTE — Progress Notes (Signed)
I have read the note, and I agree with the clinical assessment and plan.  Aleksander Edmiston A. Srihaan Mastrangelo, MD, PhD, FAAN Certified in Neurology, Clinical Neurophysiology, Sleep Medicine, Pain Medicine and Neuroimaging  [facility] [address], [address] [place], [place] [address] [phone]  

## 2019-01-22 NOTE — Progress Notes (Signed)
PATIENT: Abigail Sullivan DOB: Mar 12, 1946  REASON FOR VISIT: follow up HISTORY FROM: patient  Chief Complaint  Patient presents with  . Follow-up    4 month follow up. Husband present. Rm 1. Patient mentioned that she feels dizzy all the time. No dizziness at present.      HISTORY OF PRESENT ILLNESS: Today 01/22/19 Abigail Sullivan is a 73 y.o. female here today for follow up for insomnia. She reports that her PCP referred her to psychiatry about a year ago. Her psychiatrist is now managing her insomnia. She is taking Belsomra 20mg  and Ativan 2mg  (2-1mg  tablets) every night. She reports that this combination helps. She has not taken Klonopin.   She continues to have frequent dizzy spells post stroke. Dizziness can occur at any time. May be a little worse with position changes. She is followed closely by cardiology and PCP. She has loop recorder. She is taking Eliquis and diltiazem as prescribed without missed doses. BP is monitored regularly and is normal. She is not interested in PT. She has participated in the past with minimal benefit. She is not interested in vestibular therapy. MRI in 08/2018 was unremarkable. She denies falls. No chest pain, sob or syncope. She does not feel that she needs to follow up with [place] any longer.     HISTORY: (copied from Dr Garth Bigness note on 09/17/2018)  Abigail Sullivan is a 73 year old woman with A. fib and strokes.   She also has severe insomnia.  Update 09/17/2018: She had 2 strokes.    One was left occipital and has led to the RUQ quadrantopsia.  Another is right frontoparietal.    I reviewed her MRI form 08/19/18 and it showed her old CVA';s and microvascular ischemic change but no acute findings.   MRA showed no significant blockage.   We discussed that the AFIb likely caused her strokes in the past.     She is having more trouble sleeping.    She has tried and failed multiple medications.    She has both sleep maintenance and sleep onset insomnia.    Ambien  caused a rash and did not help.   Belsomra 20 mg and lorazepam 2 mg she falls asleep and stays asleep.    Lunesta 3 mg and lorazepam has also helped.   Quetiapine helps restlessness but not sleep.   Temazepam had not helped.   Trazodone even at 200 mg did not help.    Flurazepam did not help as well as the combinations above.    Sleep worsened after her strokes.     She does use good sleep hygiene.     She feels like she is going to faint with frequent lightheaded spells and sometimes vertigo.    That led to MRIs 08/19/2018 that showed no acute findings.   She now has a loop recorder   From 10/14/2017 She is a 73 year old woman with chronic paroxysmal atrial fibrillation who has had 2 strokes, one in the posterior right frontal lobe and one in the medial occipital lobe.    She has been on Eliquis for the past year.   She is very compliant and has not missed any doses.     Since the spring, she has had some difficulties with vision, restlessness and  she notes lightheadedness when she is standing up.   She feels unsteady but does not note vertigo.    She feels more unsteady if she tries to turn.   This occurs the entire time  she is standing up and never experiences it with sitting or laying down.     She notes the onset is immediate upon standing.     The restlessness involves fidgeting. When she is sitting or laying down,  after a while, she feels she has to get up and stand. When she is standing, after a while, she feels she needs to sit.   This usually happens after a minute or 2.  She feels her balance is worse.    She has not fallen.     She notes coordination is worse -- with both [place] and fine motor tasks like laundry, writing, buttoning.  She does not note any change in her muscle tone. There is no tremor.  She feels vision is not clear and has a haze or fuzz around every item.     She has seen ophthalmology and they noted that in August visual fields were fine but in October she had reduced  RUQ visual field vision.    She often feels an urge to stand up and walk but does not note much RLS when laying.    She sleeps well at night on the combination of olanzepine an temazepam (she sees Dr. Casimiro Needle).   She has been off the olanzepine a few times for a day or two and sleep is worse but restlessness is not any better.    She had chest discomfort a couple months ago and went to the ED.    Her diltiazem was increased from 240 to 360 mg.    I personally reviewed the 10/10/2017 and 08/19/2017 CT scans of the head. They show 2 wedge-shaped remote infarctions in the posterior right frontal lobe and the medial left occipital lobe.    There is no difference between the 2 scans  REVIEW OF SYSTEMS: Out of a complete 14 system review of symptoms, the patient complains only of the following symptoms, excessive sweating, loss of vision, heat intolerance, insomnia, frequent waking, back pain, walking difficulty, rash, memory loss, dizziness and all other reviewed systems are negative.  ALLERGIES: Allergies  Allergen Reactions  . Lisinopril Cough  . Lipitor [Atorvastatin]     Myalgias    HOME MEDICATIONS: Outpatient Medications Prior to Visit  Medication Sig Dispense Refill  . acetaminophen (TYLENOL) 500 MG tablet Take 1,500 mg by mouth daily as needed for moderate pain.    Marland Kitchen acyclovir (ZOVIRAX) 200 MG capsule Take 200 mg by mouth 2 (two) times daily.     Marland Kitchen diltiazem (TIAZAC) 360 MG 24 hr capsule Take 1 capsule (360 mg total) by mouth daily. 90 capsule 3  . ELIQUIS 5 MG TABS tablet TAKE 1 TABLET(5 MG) BY MOUTH TWICE DAILY 60 tablet 6  . ibuprofen (ADVIL,MOTRIN) 200 MG tablet Take 600-800 mg by mouth daily as needed (back pain).    . LORazepam (ATIVAN) 1 MG tablet Take 1 mg by mouth at bedtime.     Marland Kitchen losartan (COZAAR) 25 MG tablet Take 25 mg by mouth daily.    . multivitamin (THERAGRAN) per tablet Take 1 tablet by mouth 2 (two) times a week.     . nitroGLYCERIN (NITROSTAT) 0.4 MG SL tablet  Place 1 tablet under the tongue every 5 minutes. Max 3 doses. If no relief after 3 doses, call 911 25 tablet 3  . simvastatin (ZOCOR) 20 MG tablet Take 20 mg by mouth every evening.     . Suvorexant (BELSOMRA) 20 MG TABS Take 20 mg by mouth at bedtime.  30 tablet 5  . ursodiol (ACTIGALL) 300 MG capsule Take 300 mg by mouth 2 (two) times daily.      . ranitidine (ZANTAC) 150 MG tablet Take 150 mg by mouth daily as needed for heartburn.     . clonazePAM (KLONOPIN) 1 MG tablet Take one or two po qHS (Patient not taking: Reported on 01/22/2019) 60 tablet 0   No facility-administered medications prior to visit.     PAST MEDICAL HISTORY: Past Medical History:  Diagnosis Date  . Atrial fibrillation (HCC)    post-operative   . Hepatitis   . Hyperlipidemia   . Hypertension   . Premature atrial contractions    systomatic PAC's and documented SVT ( likley atrial tachycardia )  . Stroke (cerebrum) ([facility])     PAST SURGICAL HISTORY: Past Surgical History:  Procedure Laterality Date  . LOOP RECORDER INSERTION N/A 09/16/2018   Procedure: LOOP RECORDER INSERTION;  Surgeon: Thompson Grayer, MD;  Location: [facility];  Service: Cardiovascular;  Laterality: N/A;  . LUMBAR LAMINECTOMY/DECOMPRESSION MICRODISCECTOMY  02/04/2011   L2-L3 and L3-L4 with diskectomy and plating    FAMILY HISTORY: Family History  Problem Relation Age of Onset  . Other Father        CABGx4    SOCIAL HISTORY: Social History   Socioeconomic History  . Marital status: Married    Spouse name: Not on file  . Number of children: Not on file  . Years of education: Not on file  . Highest education level: Not on file  Occupational History  . Not on file  Social Needs  . Financial resource strain: Not on file  . Food insecurity:    Worry: Not on file    Inability: Not on file  . Transportation needs:    Medical: Not on file    Non-medical: Not on file  Tobacco Use  . Smoking status: Former Smoker    Packs/day:  0.30    Types: Cigarettes    Last attempt to quit: 12/22/2016    Years since quitting: 2.0  . Smokeless tobacco: Never Used  . Tobacco comment: smokes 3 cigarettes per day, not interested in quitting  Substance and Sexual Activity  . Alcohol use: No  . Drug use: No  . Sexual activity: Not on file  Lifestyle  . Physical activity:    Days per week: Not on file    Minutes per session: Not on file  . Stress: Not on file  Relationships  . Social connections:    Talks on phone: Not on file    Gets together: Not on file    Attends religious service: Not on file    Active member of club or organization: Not on file    Attends meetings of clubs or organizations: Not on file    Relationship status: Not on file  . Intimate partner violence:    Fear of current or ex partner: Not on file    Emotionally abused: Not on file    Physically abused: Not on file    Forced sexual activity: Not on file  Other Topics Concern  . Not on file  Social History Narrative  . Not on file      PHYSICAL EXAM  Vitals:   01/22/19 0916  BP: 128/79  Pulse: 93  Weight: 119 lb (54 kg)  Height: 5\' 4"  (1.626 m)   Body mass index is 20.43 kg/m.  Generalized: Well developed, in no acute distress  Cardiology: normal rate  and rhythm, no murmur noted Neurological examination  Mentation: Alert oriented to time, place, history taking. Follows all commands speech and language fluent Cranial nerve II-XII: Pupils were equal round reactive to light. Extraocular movements were full, visual field deficit on right with peripheral testing. Facial sensation and strength were normal. Uvula tongue midline. Head turning and shoulder shrug  were normal and symmetric. Motor: The motor testing reveals 4 over 5 strength of all 4 extremities. Good symmetric motor tone is noted throughout.  Sensory: Sensory testing is intact to soft touch on all 4 extremities. No evidence of extinction is noted.  Coordination: Cerebellar  testing reveals good finger-nose-finger bilaterally, ataxia with heel-to-shin bilaterally.  Gait and station: Gait is short and guarded but stable, unable to assess Tandem walking due to patient concerns of falling  DIAGNOSTIC DATA (LABS, IMAGING, TESTING) - I reviewed patient records, labs, notes, testing and imaging myself where available.  No flowsheet data found.   Lab Results  Component Value Date   WBC 7.4 09/10/2018   HGB 13.6 09/10/2018   HCT 40.2 09/10/2018   MCV 90 09/10/2018   PLT 353 09/10/2018      Component Value Date/Time   NA 140 09/10/2018 1031   K 4.4 09/10/2018 1031   CL 100 09/10/2018 1031   CO2 22 09/10/2018 1031   GLUCOSE 91 09/10/2018 1031   GLUCOSE 93 06/11/2016 1028   BUN 16 09/10/2018 1031   CREATININE 0.96 09/10/2018 1031   CREATININE 1.10 (H) 06/11/2016 1028   CALCIUM 9.8 09/10/2018 1031   PROT 7.6 09/10/2018 1031   ALBUMIN 4.3 09/10/2018 1031   AST 23 09/10/2018 1031   ALT 20 09/10/2018 1031   ALKPHOS 225 (H) 09/10/2018 1031   BILITOT 0.3 09/10/2018 1031   GFRNONAA 59 (L) 09/10/2018 1031   GFRAA 68 09/10/2018 1031   Lab Results  Component Value Date   CHOL 151 09/10/2018   HDL 62 09/10/2018   LDLCALC 64 09/10/2018   TRIG 124 09/10/2018   CHOLHDL 2.4 09/10/2018   No results found for: HGBA1C No results found for: VITAMINB12  TSH: 4.49     ASSESSMENT AND PLAN 73 y.o. year old female  has a past medical history of Atrial fibrillation ([facility]), Hepatitis, Hyperlipidemia, Hypertension, Premature atrial contractions, and Stroke (cerebrum) ([facility]). here with     ICD-10-CM   1. Insomnia, unspecified type G47.00   2. Visual field loss following stroke I69.398    H54.7   3. Dizziness R42     She is doing well from insomnia standpoint. She reports that medications are managed by psychiatry. Belsomra 20mg  and Ativan 2mg  keeps her asleep most of the night. She continues to have dizzy spells but is not interested in a workup at this time as she  feels that this is stable since stoke. She was offered PT but declines stating that she knows she will not comply with therapy. She agrees to close follow up with PCP and cardiology. She does not wish to follow up with our office at this time. I have advised that she call as needed.    No orders of the defined types were placed in this encounter.    No orders of the defined types were placed in this encounter.     Debbora Presto, FNP-C 01/22/2019, 10:12 AM [facility] [address], [place], [place] [address] [phone]

## 2019-01-26 ENCOUNTER — Ambulatory Visit (INDEPENDENT_AMBULATORY_CARE_PROVIDER_SITE_OTHER): Payer: Medicare Other | Admitting: *Deleted

## 2019-01-26 DIAGNOSIS — R002 Palpitations: Secondary | ICD-10-CM

## 2019-01-26 DIAGNOSIS — I480 Paroxysmal atrial fibrillation: Secondary | ICD-10-CM

## 2019-01-27 LAB — CUP PACEART REMOTE DEVICE CHECK
MDC IDC PG IMPLANT DT: 20191015
MDC IDC SESS DTM: 20200224133844

## 2019-01-28 DIAGNOSIS — L300 Nummular dermatitis: Secondary | ICD-10-CM | POA: Diagnosis not present

## 2019-01-28 DIAGNOSIS — I8310 Varicose veins of unspecified lower extremity with inflammation: Secondary | ICD-10-CM | POA: Diagnosis not present

## 2019-01-28 DIAGNOSIS — L304 Erythema intertrigo: Secondary | ICD-10-CM | POA: Diagnosis not present

## 2019-01-30 DIAGNOSIS — N183 Chronic kidney disease, stage 3 (moderate): Secondary | ICD-10-CM | POA: Diagnosis not present

## 2019-01-30 DIAGNOSIS — I700 Atherosclerosis of aorta: Secondary | ICD-10-CM | POA: Diagnosis not present

## 2019-01-30 DIAGNOSIS — I129 Hypertensive chronic kidney disease with stage 1 through stage 4 chronic kidney disease, or unspecified chronic kidney disease: Secondary | ICD-10-CM | POA: Diagnosis not present

## 2019-01-30 DIAGNOSIS — K754 Autoimmune hepatitis: Secondary | ICD-10-CM | POA: Diagnosis not present

## 2019-02-03 NOTE — Progress Notes (Signed)
Carelink Summary Report / Loop Recorder 

## 2019-02-06 DIAGNOSIS — K7460 Unspecified cirrhosis of liver: Secondary | ICD-10-CM | POA: Diagnosis not present

## 2019-02-06 DIAGNOSIS — K743 Primary biliary cirrhosis: Secondary | ICD-10-CM | POA: Diagnosis not present

## 2019-03-02 ENCOUNTER — Other Ambulatory Visit: Payer: Self-pay

## 2019-03-02 ENCOUNTER — Ambulatory Visit (INDEPENDENT_AMBULATORY_CARE_PROVIDER_SITE_OTHER): Payer: Medicare Other | Admitting: *Deleted

## 2019-03-02 DIAGNOSIS — R002 Palpitations: Secondary | ICD-10-CM

## 2019-03-02 DIAGNOSIS — I480 Paroxysmal atrial fibrillation: Secondary | ICD-10-CM

## 2019-03-02 LAB — CUP PACEART REMOTE DEVICE CHECK
Date Time Interrogation Session: 20200328154116
MDC IDC PG IMPLANT DT: 20191015

## 2019-03-03 DIAGNOSIS — K754 Autoimmune hepatitis: Secondary | ICD-10-CM | POA: Diagnosis not present

## 2019-03-03 DIAGNOSIS — I129 Hypertensive chronic kidney disease with stage 1 through stage 4 chronic kidney disease, or unspecified chronic kidney disease: Secondary | ICD-10-CM | POA: Diagnosis not present

## 2019-03-03 DIAGNOSIS — I700 Atherosclerosis of aorta: Secondary | ICD-10-CM | POA: Diagnosis not present

## 2019-03-03 DIAGNOSIS — N183 Chronic kidney disease, stage 3 (moderate): Secondary | ICD-10-CM | POA: Diagnosis not present

## 2019-03-09 DIAGNOSIS — K743 Primary biliary cirrhosis: Secondary | ICD-10-CM | POA: Diagnosis not present

## 2019-03-09 DIAGNOSIS — K745 Biliary cirrhosis, unspecified: Secondary | ICD-10-CM | POA: Diagnosis not present

## 2019-03-10 NOTE — Progress Notes (Signed)
Carelink Summary Report / Loop Recorder 

## 2019-03-20 DIAGNOSIS — F4322 Adjustment disorder with anxiety: Secondary | ICD-10-CM | POA: Diagnosis not present

## 2019-03-30 ENCOUNTER — Other Ambulatory Visit: Payer: Self-pay | Admitting: Neurology

## 2019-03-30 NOTE — Telephone Encounter (Signed)
[facility] Database Verified LR: 03/01/2019 Qty: 30 Pending appointment: No pending appt.

## 2019-04-02 ENCOUNTER — Other Ambulatory Visit: Payer: Self-pay

## 2019-04-02 ENCOUNTER — Ambulatory Visit (INDEPENDENT_AMBULATORY_CARE_PROVIDER_SITE_OTHER): Payer: Medicare Other | Admitting: *Deleted

## 2019-04-02 DIAGNOSIS — I480 Paroxysmal atrial fibrillation: Secondary | ICD-10-CM

## 2019-04-02 DIAGNOSIS — I129 Hypertensive chronic kidney disease with stage 1 through stage 4 chronic kidney disease, or unspecified chronic kidney disease: Secondary | ICD-10-CM | POA: Diagnosis not present

## 2019-04-02 DIAGNOSIS — I700 Atherosclerosis of aorta: Secondary | ICD-10-CM | POA: Diagnosis not present

## 2019-04-02 DIAGNOSIS — R002 Palpitations: Secondary | ICD-10-CM

## 2019-04-02 DIAGNOSIS — K754 Autoimmune hepatitis: Secondary | ICD-10-CM | POA: Diagnosis not present

## 2019-04-02 DIAGNOSIS — N183 Chronic kidney disease, stage 3 (moderate): Secondary | ICD-10-CM | POA: Diagnosis not present

## 2019-04-02 LAB — CUP PACEART REMOTE DEVICE CHECK
Date Time Interrogation Session: 20200430143157
Implantable Pulse Generator Implant Date: 20191015

## 2019-04-10 DIAGNOSIS — K754 Autoimmune hepatitis: Secondary | ICD-10-CM | POA: Diagnosis not present

## 2019-04-10 DIAGNOSIS — I129 Hypertensive chronic kidney disease with stage 1 through stage 4 chronic kidney disease, or unspecified chronic kidney disease: Secondary | ICD-10-CM | POA: Diagnosis not present

## 2019-04-10 DIAGNOSIS — E782 Mixed hyperlipidemia: Secondary | ICD-10-CM | POA: Diagnosis not present

## 2019-04-13 NOTE — Progress Notes (Signed)
Carelink Summary Report / Loop Recorder 

## 2019-04-17 DIAGNOSIS — N182 Chronic kidney disease, stage 2 (mild): Secondary | ICD-10-CM | POA: Diagnosis not present

## 2019-04-17 DIAGNOSIS — I129 Hypertensive chronic kidney disease with stage 1 through stage 4 chronic kidney disease, or unspecified chronic kidney disease: Secondary | ICD-10-CM | POA: Diagnosis not present

## 2019-04-17 DIAGNOSIS — F308 Other manic episodes: Secondary | ICD-10-CM | POA: Diagnosis not present

## 2019-04-17 DIAGNOSIS — E782 Mixed hyperlipidemia: Secondary | ICD-10-CM | POA: Diagnosis not present

## 2019-05-01 DIAGNOSIS — I129 Hypertensive chronic kidney disease with stage 1 through stage 4 chronic kidney disease, or unspecified chronic kidney disease: Secondary | ICD-10-CM | POA: Diagnosis not present

## 2019-05-01 DIAGNOSIS — N182 Chronic kidney disease, stage 2 (mild): Secondary | ICD-10-CM | POA: Diagnosis not present

## 2019-05-01 DIAGNOSIS — F308 Other manic episodes: Secondary | ICD-10-CM | POA: Diagnosis not present

## 2019-05-01 DIAGNOSIS — E782 Mixed hyperlipidemia: Secondary | ICD-10-CM | POA: Diagnosis not present

## 2019-05-05 ENCOUNTER — Ambulatory Visit (INDEPENDENT_AMBULATORY_CARE_PROVIDER_SITE_OTHER): Payer: Medicare Other | Admitting: *Deleted

## 2019-05-05 DIAGNOSIS — I480 Paroxysmal atrial fibrillation: Secondary | ICD-10-CM | POA: Diagnosis not present

## 2019-05-06 LAB — CUP PACEART REMOTE DEVICE CHECK
Date Time Interrogation Session: 20200602173555
Implantable Pulse Generator Implant Date: 20191015

## 2019-05-11 ENCOUNTER — Telehealth: Payer: Self-pay | Admitting: Student

## 2019-05-11 DIAGNOSIS — Z139 Encounter for screening, unspecified: Secondary | ICD-10-CM | POA: Diagnosis not present

## 2019-05-11 DIAGNOSIS — I480 Paroxysmal atrial fibrillation: Secondary | ICD-10-CM

## 2019-05-11 DIAGNOSIS — Z0000 Encounter for general adult medical examination without abnormal findings: Secondary | ICD-10-CM | POA: Diagnosis not present

## 2019-05-11 DIAGNOSIS — F3110 Bipolar disorder, current episode manic without psychotic features, unspecified: Secondary | ICD-10-CM | POA: Diagnosis not present

## 2019-05-11 MED ORDER — DILTIAZEM HCL ER BEADS 180 MG PO CP24: 180.0000 mg | ORAL_CAPSULE | Freq: Every day | ORAL | 5 refills | Status: DC

## 2019-05-11 NOTE — Telephone Encounter (Signed)
Discussed with Dr. Rayann Heman.    Will decrease Diltiazem to 180 mg daily. Will hold dose tomorrow, 05/12/2019, and start new dose 05/13/2019 and continue to follow symptoms. Pt again walked through how to use symptom activator.     Pt concerned about cost. Walked through [employer] 30 day supply of diltiazem should be no more than $16.    Legrand Como [address] [place], PA-C 05/11/2019 2:28 PM

## 2019-05-11 NOTE — Telephone Encounter (Signed)
  Spoke with patient. States she had about 5 episodes yesterday of near syncope. She states at times she has a hollow/cold feeling throughout her body and feels "something wrong" with her heart, and then feels like she is about to faint.   She has a symptom recorder, but has not been using it correctly, so we have 0 "symptom episodes". Instructed on proper use. See below for pause recorded yesterday, difficult to tell if post termination. No further pauses noted, but also Afib with slow VR. Pt taking all medication as directed.  Other episodes reviewed and appear similar to her previous known Afib.      Pause Episode: 05/10/2019 09:08   AF Episode with slow VR 05/09/2019  14:54    (2 minutes)

## 2019-05-13 NOTE — Progress Notes (Signed)
Carelink Summary Report / Loop Recorder 

## 2019-06-02 DIAGNOSIS — I679 Cerebrovascular disease, unspecified: Secondary | ICD-10-CM | POA: Diagnosis not present

## 2019-06-02 DIAGNOSIS — I129 Hypertensive chronic kidney disease with stage 1 through stage 4 chronic kidney disease, or unspecified chronic kidney disease: Secondary | ICD-10-CM | POA: Diagnosis not present

## 2019-06-02 DIAGNOSIS — N182 Chronic kidney disease, stage 2 (mild): Secondary | ICD-10-CM | POA: Diagnosis not present

## 2019-06-02 DIAGNOSIS — E782 Mixed hyperlipidemia: Secondary | ICD-10-CM | POA: Diagnosis not present

## 2019-06-08 ENCOUNTER — Ambulatory Visit (INDEPENDENT_AMBULATORY_CARE_PROVIDER_SITE_OTHER): Payer: Medicare Other | Admitting: *Deleted

## 2019-06-08 DIAGNOSIS — I480 Paroxysmal atrial fibrillation: Secondary | ICD-10-CM

## 2019-06-08 LAB — CUP PACEART REMOTE DEVICE CHECK
Date Time Interrogation Session: 20200705180955
Implantable Pulse Generator Implant Date: 20191015

## 2019-06-12 DIAGNOSIS — H832X9 Labyrinthine dysfunction, unspecified ear: Secondary | ICD-10-CM | POA: Diagnosis not present

## 2019-06-12 DIAGNOSIS — R42 Dizziness and giddiness: Secondary | ICD-10-CM | POA: Diagnosis not present

## 2019-06-12 DIAGNOSIS — R296 Repeated falls: Secondary | ICD-10-CM | POA: Diagnosis not present

## 2019-06-12 DIAGNOSIS — I679 Cerebrovascular disease, unspecified: Secondary | ICD-10-CM | POA: Diagnosis not present

## 2019-06-16 NOTE — Progress Notes (Signed)
Carelink Summary Report / Loop Recorder 

## 2019-07-03 DIAGNOSIS — N182 Chronic kidney disease, stage 2 (mild): Secondary | ICD-10-CM | POA: Diagnosis not present

## 2019-07-03 DIAGNOSIS — I679 Cerebrovascular disease, unspecified: Secondary | ICD-10-CM | POA: Diagnosis not present

## 2019-07-03 DIAGNOSIS — I129 Hypertensive chronic kidney disease with stage 1 through stage 4 chronic kidney disease, or unspecified chronic kidney disease: Secondary | ICD-10-CM | POA: Diagnosis not present

## 2019-07-03 DIAGNOSIS — E782 Mixed hyperlipidemia: Secondary | ICD-10-CM | POA: Diagnosis not present

## 2019-07-08 DIAGNOSIS — G4700 Insomnia, unspecified: Secondary | ICD-10-CM | POA: Diagnosis not present

## 2019-07-10 ENCOUNTER — Ambulatory Visit (INDEPENDENT_AMBULATORY_CARE_PROVIDER_SITE_OTHER): Payer: Medicare Other | Admitting: *Deleted

## 2019-07-10 DIAGNOSIS — I480 Paroxysmal atrial fibrillation: Secondary | ICD-10-CM | POA: Diagnosis not present

## 2019-07-12 LAB — CUP PACEART REMOTE DEVICE CHECK
Date Time Interrogation Session: 20200807194043
Implantable Pulse Generator Implant Date: 20191015

## 2019-07-13 DIAGNOSIS — I129 Hypertensive chronic kidney disease with stage 1 through stage 4 chronic kidney disease, or unspecified chronic kidney disease: Secondary | ICD-10-CM | POA: Diagnosis not present

## 2019-07-13 DIAGNOSIS — Z13220 Encounter for screening for lipoid disorders: Secondary | ICD-10-CM | POA: Diagnosis not present

## 2019-07-13 DIAGNOSIS — K754 Autoimmune hepatitis: Secondary | ICD-10-CM | POA: Diagnosis not present

## 2019-07-14 NOTE — Progress Notes (Signed)
Carelink Summary Report / Loop Recorder 

## 2019-07-15 NOTE — Progress Notes (Signed)
NEUROLOGY CONSULTATION NOTE  Abigail Sullivan MRN: [id number] DOB: 03-Aug-1946  Referring provider: Marco Collie, MD Primary care provider: Marco Collie, MD  Reason for consult:  dizziness  HISTORY OF PRESENT ILLNESS: Abigail Sullivan is a 73 year old female with paroxysmal atrial fibrillation and history of strokes who presents for dizziness.  She is accompanied by her husband who supplements history.   She has had a history of dizziness since at least 2018.  She does have atrial fibrillation.  She initially saw Dr. Arlice Colt at [facility].  CT of head from 08/16/17 showed remote left parietal infarct.  No clinical stroke but on neurologic exam, she did demonstrate a corresponding field cut.  She is being followed by cardiology and is with loop recorder.  She has had recurrent spells but didn't know how to use the device and didn't mark them.  However, recorder has noted afib with slow VR.    Over the past 3 to 4 weeks, she has been falling once a week. They are not associated with her lightheadedness. They occur while walking.  No dizziness, vertigo, loss of consciousness, leg weakness, numbness in feet.  Her legs just give out and falls.  She always hits her head.  No preceding warning.  She reports that her eye sight is more blurred but not a significant change.  She has degenerative disc disease of the lumbar spine with right L5-S1 radiculopathy, which is now aggravated following on of her falls.  She also has history of right hip replacement.  PAST MEDICAL HISTORY: Past Medical History:  Diagnosis Date  . Atrial fibrillation (HCC)    post-operative   . Hepatitis   . Hyperlipidemia   . Hypertension   . Premature atrial contractions    systomatic PAC's and documented SVT ( likley atrial tachycardia )  . Stroke (cerebrum) ([facility])     PAST SURGICAL HISTORY: Past Surgical History:  Procedure Laterality Date  . HEMORROIDECTOMY    . LOOP RECORDER INSERTION N/A  09/16/2018   Procedure: LOOP RECORDER INSERTION;  Surgeon: Thompson Grayer, MD;  Location: [facility];  Service: Cardiovascular;  Laterality: N/A;  . LUMBAR LAMINECTOMY/DECOMPRESSION MICRODISCECTOMY  02/04/2011   L2-L3 and L3-L4 with diskectomy and plating  . SHOULDER ARTHROSCOPY W/ ROTATOR CUFF REPAIR Left     MEDICATIONS: Current Outpatient Medications on File Prior to Visit  Medication Sig Dispense Refill  . acetaminophen (TYLENOL) 500 MG tablet Take 1,500 mg by mouth daily as needed for moderate pain.    Marland Kitchen acyclovir (ZOVIRAX) 200 MG capsule Take 200 mg by mouth 2 (two) times daily.     . BELSOMRA 20 MG TABS TAKE 1 TABLET BY MOUTH AT BEDTIME 30 tablet 5  . diltiazem (TIAZAC) 180 MG 24 hr capsule Take 1 capsule (180 mg total) by mouth daily. 30 capsule 5  . ELIQUIS 5 MG TABS tablet TAKE 1 TABLET(5 MG) BY MOUTH TWICE DAILY 60 tablet 6  . ibuprofen (ADVIL,MOTRIN) 200 MG tablet Take 600-800 mg by mouth daily as needed (back pain).    . LORazepam (ATIVAN) 1 MG tablet Take 1 mg by mouth at bedtime.     Marland Kitchen losartan (COZAAR) 25 MG tablet Take 25 mg by mouth daily.    . nitroGLYCERIN (NITROSTAT) 0.4 MG SL tablet Place 1 tablet under the tongue every 5 minutes. Max 3 doses. If no relief after 3 doses, call 911 25 tablet 3  . simvastatin (ZOCOR) 20 MG tablet Take 20 mg by mouth  every evening.     . ursodiol (ACTIGALL) 300 MG capsule Take 300 mg by mouth 2 (two) times daily.       No current facility-administered medications on file prior to visit.     ALLERGIES: Allergies  Allergen Reactions  . Lisinopril Cough  . Lipitor [Atorvastatin]     Myalgias    FAMILY HISTORY: Family History  Problem Relation Age of Onset  . Hypertension Mother   . Other Father        CABGx4    SOCIAL HISTORY: Social History   Socioeconomic History  . Marital status: Married    Spouse name: Ed  . Number of children: Not on file  . Years of education: 31  . Highest education level: 12th grade   Occupational History  . Occupation: retired  Scientific laboratory technician  . Financial resource strain: Not on file  . Food insecurity    Worry: Not on file    Inability: Not on file  . Transportation needs    Medical: Not on file    Non-medical: Not on file  Tobacco Use  . Smoking status: Former Smoker    Packs/day: 0.30    Types: Cigarettes    Quit date: 12/22/2016    Years since quitting: 2.5  . Smokeless tobacco: Never Used  . Tobacco comment: smokes 3 cigarettes per day, not interested in quitting  Substance and Sexual Activity  . Alcohol use: No  . Drug use: No  . Sexual activity: Not on file  Lifestyle  . Physical activity    Days per week: Not on file    Minutes per session: Not on file  . Stress: Not on file  Relationships  . Social Herbalist on phone: Not on file    Gets together: Not on file    Attends religious service: Not on file    Active member of club or organization: Not on file    Attends meetings of clubs or organizations: Not on file    Relationship status: Not on file  . Intimate partner violence    Fear of current or ex partner: Not on file    Emotionally abused: Not on file    Physically abused: Not on file    Forced sexual activity: Not on file  Other Topics Concern  . Not on file  Social History Narrative   Patient is right-handed.    REVIEW OF SYSTEMS: Constitutional: No fevers, chills, or sweats, no generalized fatigue, change in appetite Eyes: No visual changes, double vision, eye pain Ear, nose and throat: No hearing loss, ear pain, nasal congestion, sore throat Cardiovascular: No chest pain, palpitations Respiratory:  No shortness of breath at rest or with exertion, wheezes GastrointestinaI: No nausea, vomiting, diarrhea, abdominal pain, fecal incontinence Genitourinary:  No dysuria, urinary retention or frequency Musculoskeletal:  No neck pain, back pain Integumentary: No rash, pruritus, skin lesions Neurological: as above  Psychiatric: No depression, insomnia, anxiety Endocrine: No palpitations, fatigue, diaphoresis, mood swings, change in appetite, change in weight, increased thirst Hematologic/Lymphatic:  No purpura, petechiae. Allergic/Immunologic: no itchy/runny eyes, nasal congestion, recent allergic reactions, rashes  PHYSICAL EXAM: Temperature 98.2 F (36.8 C), temperature source Temporal, height 5\' 4"  (1.626 m), weight 127 lb (57.6 kg), SpO2 98 %. General: No acute distress.  Patient appears well-groomed.  Head:  Normocephalic/atraumatic Eyes:  fundi examined but not visualized Neck: supple, no paraspinal tenderness, full range of motion Back: No paraspinal tenderness Heart: regular rate and rhythm Lungs:  Clear to auscultation bilaterally. Vascular: No carotid bruits. Neurological Exam: Mental status: alert and oriented to person, place, and time, recent and remote memory intact, fund of knowledge intact, attention and concentration intact, speech fluent and not dysarthric, language intact. Cranial nerves: CN I: not tested CN II: pupils equal, round and reactive to light, visual fields intact CN III, IV, VI:  full range of motion, no nystagmus, no ptosis CN V: facial sensation intact CN VII: upper and lower face symmetric CN VIII: hearing intact CN IX, X: gag intact, uvula midline CN XI: sternocleidomastoid and trapezius muscles intact CN XII: tongue midline Bulk & Tone: normal, no fasciculations. Motor:  5/5 throughout  Sensation:  Pinprick sensation intact, vibration sensation mildly reduced in toes. Deep Tendon Reflexes:  2+ throughout, toes downgoing. Finger to nose testing:  Without dysmetria.  Heel to shin:  Without dysmetria.  Gait:  Antalgic gait (secondary to acute on chronic lumbar radiculopathy).  Able to turn.  Romberg negative.  IMPRESSION: 1.  Falls.  What she is describing appears to be consistent with drop attacks.  She shows signs of mild neuropathy in the feet, which may  be due to underlying idiopathic neuropathy or lumbar radiculopathy, but this would not account for her falls.  Exam findings not suggestive of myelopathy (such as may be seen in cervical spinal stenosis).  PLAN: 1.  We will check MRI of brain 2.  We will check EEG 3.  Further recommendations pending results.  Thank you for allowing me to take part in the care of this patient.  Metta Clines, DO  CC: Marco Collie, MD

## 2019-07-17 ENCOUNTER — Other Ambulatory Visit: Payer: Self-pay

## 2019-07-17 ENCOUNTER — Encounter: Payer: Self-pay | Admitting: Neurology

## 2019-07-17 ENCOUNTER — Telehealth: Payer: Self-pay

## 2019-07-17 ENCOUNTER — Telehealth: Payer: Self-pay | Admitting: Internal Medicine

## 2019-07-17 ENCOUNTER — Ambulatory Visit (INDEPENDENT_AMBULATORY_CARE_PROVIDER_SITE_OTHER): Payer: Medicare Other | Admitting: Neurology

## 2019-07-17 VITALS — Temp 98.2°F | Ht 64.0 in | Wt 127.0 lb

## 2019-07-17 DIAGNOSIS — R55 Syncope and collapse: Secondary | ICD-10-CM | POA: Diagnosis not present

## 2019-07-17 NOTE — Telephone Encounter (Signed)
Spoke w/ patient and let her know that the ILR is MRI compatible. Answered all questions. Gave pt direct device clinic number if she has further questions regarding her ILR.

## 2019-07-17 NOTE — Patient Instructions (Addendum)
Ideally, I would like you to have an MRI of the brain.  I will contact Dr. Jackalyn Lombard office to see if this is possible.  If not, we will get CT of brain.  We will check EEG  Further recommendations pending results.  In meantime, I would use the walker since you have no warning when you are going to fall.  We have sent a referral to [facility] for your MRI and they will call you directly to schedule your appointment. They are located at [place]. If you need to contact them directly please call [phone].

## 2019-07-17 NOTE — Telephone Encounter (Signed)
Called [facility], to speak with Dr. Bettey Costa nurse.  Dr.Jaffe wants Pt to have MRI and Pt has loop recorder, need to know if MRI compatable. Was transferred to [facility] in devices department. She said loop recorders are compatable for MRI's. Pt will need to call the morning of MRI to transmit, the MRI will erase all recordings. Pt will need to call their office with any questions. I advised Pt and her husband Ed of this.

## 2019-07-17 NOTE — Telephone Encounter (Signed)
New Message   Patient is calling because she saw her neurologist today and that want her to have a Brain Scan. But they are tell her she needs to turn off her loop recorder.

## 2019-07-20 DIAGNOSIS — I129 Hypertensive chronic kidney disease with stage 1 through stage 4 chronic kidney disease, or unspecified chronic kidney disease: Secondary | ICD-10-CM | POA: Diagnosis not present

## 2019-07-20 DIAGNOSIS — N182 Chronic kidney disease, stage 2 (mild): Secondary | ICD-10-CM | POA: Diagnosis not present

## 2019-07-20 DIAGNOSIS — E782 Mixed hyperlipidemia: Secondary | ICD-10-CM | POA: Diagnosis not present

## 2019-07-20 DIAGNOSIS — K754 Autoimmune hepatitis: Secondary | ICD-10-CM | POA: Diagnosis not present

## 2019-07-24 ENCOUNTER — Ambulatory Visit (INDEPENDENT_AMBULATORY_CARE_PROVIDER_SITE_OTHER): Payer: Medicare Other | Admitting: Neurology

## 2019-07-24 ENCOUNTER — Other Ambulatory Visit: Payer: Self-pay

## 2019-07-24 DIAGNOSIS — R55 Syncope and collapse: Secondary | ICD-10-CM | POA: Diagnosis not present

## 2019-07-30 ENCOUNTER — Other Ambulatory Visit: Payer: Self-pay | Admitting: *Deleted

## 2019-07-30 ENCOUNTER — Other Ambulatory Visit: Payer: Medicare Other

## 2019-07-30 DIAGNOSIS — R55 Syncope and collapse: Secondary | ICD-10-CM

## 2019-07-30 DIAGNOSIS — R42 Dizziness and giddiness: Secondary | ICD-10-CM

## 2019-07-30 NOTE — Procedures (Signed)
ELECTROENCEPHALOGRAM REPORT  Date of Study: 07/24/2019  Patient's Name: Abigail Sullivan MRN: [id number] Date of Birth: Mar 11, 1946  Referring Provider: Metta Clines  Clinical History: 73 year old woman with drop attacks.  Medications: TYLENOL 500 MG tablet ZOVIRAX 200 MG capsule BELSOMRA 20 MG TABS TIAZAC 180 MG 24 hr capsule ELIQUIS 5 MG TABS tablet ADVIL,MOTRIN 200 MG tablet ATIVAN 1 MG tablet COZAAR 25 MG tablet NITROSTAT 0.4 MG SL tablet ZOCOR 20 MG tablet ACTIGALL 300 MG capsule  Technical Summary: A multichannel digital EEG recording measured by the international 10-20 system with electrodes applied with paste and impedances below 5000 ohms performed in our laboratory with EKG monitoring in an awake and drowsy patient.  Hyperventilation not performed due to patient wearing mask for COVID.  Photic stimulation was performed.  The digital EEG was referentially recorded, reformatted, and digitally filtered in a variety of bipolar and referential montages for optimal display.    Description: The patient is awake and drowsy during the recording.  During maximal wakefulness, there is a symmetric, medium voltage 9 Hz posterior dominant rhythm that attenuates with eye opening.  The record is symmetric.  During drowsiness, there is an increase in theta slowing of the background.  Stage 2 sleep was not seen.  Photic stimulation did not elicit any abnormalities.  There were no epileptiform discharges or electrographic seizures seen.    EKG lead was unremarkable.  Impression: This awake and drowsy EEG is normal.    Clinical Correlation: A normal EEG does not exclude a clinical diagnosis of epilepsy.  If further clinical questions remain, prolonged EEG may be helpful.  Clinical correlation is advised.   Metta Clines, DO

## 2019-08-03 DIAGNOSIS — E782 Mixed hyperlipidemia: Secondary | ICD-10-CM | POA: Diagnosis not present

## 2019-08-03 DIAGNOSIS — K754 Autoimmune hepatitis: Secondary | ICD-10-CM | POA: Diagnosis not present

## 2019-08-03 DIAGNOSIS — N182 Chronic kidney disease, stage 2 (mild): Secondary | ICD-10-CM | POA: Diagnosis not present

## 2019-08-03 DIAGNOSIS — I129 Hypertensive chronic kidney disease with stage 1 through stage 4 chronic kidney disease, or unspecified chronic kidney disease: Secondary | ICD-10-CM | POA: Diagnosis not present

## 2019-08-12 ENCOUNTER — Ambulatory Visit (INDEPENDENT_AMBULATORY_CARE_PROVIDER_SITE_OTHER): Payer: Medicare Other | Admitting: *Deleted

## 2019-08-12 DIAGNOSIS — R002 Palpitations: Secondary | ICD-10-CM

## 2019-08-12 DIAGNOSIS — I480 Paroxysmal atrial fibrillation: Secondary | ICD-10-CM | POA: Diagnosis not present

## 2019-08-12 LAB — CUP PACEART REMOTE DEVICE CHECK
Date Time Interrogation Session: 20200909172029
Implantable Pulse Generator Implant Date: 20191015

## 2019-08-14 ENCOUNTER — Other Ambulatory Visit: Payer: Self-pay

## 2019-08-14 ENCOUNTER — Ambulatory Visit (INDEPENDENT_AMBULATORY_CARE_PROVIDER_SITE_OTHER): Payer: Medicare Other | Admitting: Neurology

## 2019-08-14 DIAGNOSIS — R42 Dizziness and giddiness: Secondary | ICD-10-CM

## 2019-08-14 DIAGNOSIS — R55 Syncope and collapse: Secondary | ICD-10-CM | POA: Diagnosis not present

## 2019-08-18 ENCOUNTER — Other Ambulatory Visit: Payer: Self-pay | Admitting: Internal Medicine

## 2019-08-18 NOTE — Telephone Encounter (Signed)
Prescription refill request for Eliquis received.  Last office visit: Allred (06-08-2019) Scr: 0.96 (09-10-2018) Age: 73 y.o. Weight: 57.6 kg (07-17-2019)  Prescription refill sent.

## 2019-08-19 ENCOUNTER — Telehealth: Payer: Self-pay

## 2019-08-19 NOTE — Telephone Encounter (Signed)
-----   Message from Pieter Partridge, DO sent at 08/19/2019  8:13 AM EDT ----- Ambulatory EEG is normal.

## 2019-08-19 NOTE — Telephone Encounter (Signed)
Called patient she was informed results

## 2019-08-19 NOTE — Procedures (Signed)
ELECTROENCEPHALOGRAM REPORT  Dates of Recording: 08/14/2019 at 8:54 AM to 08/17/2019 at 4:08 PM  Patient's Name: Abigail Sullivan MRN: [id number] Date of Birth: [birth year]  Referring Provider: Metta Clines, D.O.  Procedure: 72-hour ambulatory EEG  History: 73 year old female with drop attacks  Medications:  TYLENOL 500 MG tablet ZOVIRAX 200 MG capsule BELSOMRA 20 MG TABS TIAZAC 180 MG 24 hr capsule ELIQUIS 5 MG TABS tablet ADVIL,MOTRIN 200 MG tablet ATIVAN 1 MG tablet COZAAR 25 MG tablet NITROSTAT 0.4 MG SL tablet ZOCOR 20 MG tablet ACTIGALL 300 MG capsule  Technical Summary: This is a 72-hour multichannel digital EEG recording measured by the international 10-20 system with electrodes applied with paste and impedances below 5000 ohms performed as portable with EKG monitoring.  The digital EEG was referentially recorded, reformatted, and digitally filtered in a variety of bipolar and referential montages for optimal display.    DESCRIPTION OF RECORDING: During maximal wakefulness, the background activity consisted of a symmetric 10Hz  posterior dominant rhythm which was reactive to eye opening.  There were no epileptiform discharges or focal slowing seen in wakefulness.  During the recording, the patient progresses through wakefulness, drowsiness, and Stage 2 sleep.  Again, there were no epileptiform discharges seen.  Events:  There were no electrographic seizures seen.  EKG lead was unremarkable.  IMPRESSION: This 72-hour ambulatory EEG study is normal.    CLINICAL CORRELATION: A normal EEG does not exclude a clinical diagnosis of epilepsy.  If further clinical questions remain, inpatient video EEG monitoring may be helpful.   Metta Clines, DO

## 2019-08-25 ENCOUNTER — Ambulatory Visit
Admission: RE | Admit: 2019-08-25 | Discharge: 2019-08-25 | Disposition: A | Discharge status: Home / Self Care | Payer: Medicare Other | Source: Ambulatory Visit | Attending: Neurology | Admitting: Neurology

## 2019-08-25 DIAGNOSIS — R55 Syncope and collapse: Secondary | ICD-10-CM | POA: Diagnosis not present

## 2019-08-27 NOTE — Progress Notes (Signed)
Carelink Summary Report / Loop Recorder 

## 2019-09-02 DIAGNOSIS — E782 Mixed hyperlipidemia: Secondary | ICD-10-CM | POA: Diagnosis not present

## 2019-09-02 DIAGNOSIS — K754 Autoimmune hepatitis: Secondary | ICD-10-CM | POA: Diagnosis not present

## 2019-09-02 DIAGNOSIS — I129 Hypertensive chronic kidney disease with stage 1 through stage 4 chronic kidney disease, or unspecified chronic kidney disease: Secondary | ICD-10-CM | POA: Diagnosis not present

## 2019-09-02 DIAGNOSIS — N182 Chronic kidney disease, stage 2 (mild): Secondary | ICD-10-CM | POA: Diagnosis not present

## 2019-09-07 DIAGNOSIS — G4700 Insomnia, unspecified: Secondary | ICD-10-CM | POA: Diagnosis not present

## 2019-09-08 ENCOUNTER — Telehealth: Payer: Self-pay | Admitting: Neurology

## 2019-09-08 DIAGNOSIS — G450 Vertebro-basilar artery syndrome: Secondary | ICD-10-CM

## 2019-09-08 DIAGNOSIS — Z23 Encounter for immunization: Secondary | ICD-10-CM | POA: Diagnosis not present

## 2019-09-08 NOTE — Telephone Encounter (Signed)
Spoke with patient she was informed and aware/ understands. Order for CTA placed

## 2019-09-08 NOTE — Telephone Encounter (Signed)
Result Notes for MR BRAIN WO CONTRAST  Notes recorded by Pieter Partridge, DO on 08/26/2019 at 2:31 PM EDT  MRI of brain shows old strokes, which have been seen on previously studies. I would like to get a CTA of the head to evaluate for vertebrobasilar insufficiency. She should be aware that we may need to order further tests. Once we rule out possible causes of her falls, we move on to the next possibility.

## 2019-09-08 NOTE — Telephone Encounter (Signed)
Patient left VM about needing MRI results. Thanks!

## 2019-09-14 ENCOUNTER — Ambulatory Visit (INDEPENDENT_AMBULATORY_CARE_PROVIDER_SITE_OTHER): Payer: Medicare Other | Admitting: *Deleted

## 2019-09-14 DIAGNOSIS — M545 Low back pain: Secondary | ICD-10-CM | POA: Diagnosis not present

## 2019-09-14 DIAGNOSIS — M5416 Radiculopathy, lumbar region: Secondary | ICD-10-CM | POA: Insufficient documentation

## 2019-09-14 DIAGNOSIS — I480 Paroxysmal atrial fibrillation: Secondary | ICD-10-CM | POA: Diagnosis not present

## 2019-09-14 DIAGNOSIS — M48061 Spinal stenosis, lumbar region without neurogenic claudication: Secondary | ICD-10-CM | POA: Diagnosis not present

## 2019-09-14 DIAGNOSIS — M4120 Other idiopathic scoliosis, site unspecified: Secondary | ICD-10-CM | POA: Diagnosis not present

## 2019-09-14 DIAGNOSIS — I471 Supraventricular tachycardia: Secondary | ICD-10-CM

## 2019-09-15 LAB — CUP PACEART REMOTE DEVICE CHECK
Date Time Interrogation Session: 20201012225952
Implantable Pulse Generator Implant Date: 20191015

## 2019-09-17 ENCOUNTER — Other Ambulatory Visit: Payer: Self-pay | Admitting: Neurosurgery

## 2019-09-17 DIAGNOSIS — M545 Low back pain, unspecified: Secondary | ICD-10-CM

## 2019-09-22 ENCOUNTER — Other Ambulatory Visit: Payer: Medicare Other

## 2019-09-23 ENCOUNTER — Ambulatory Visit
Admission: RE | Admit: 2019-09-23 | Discharge: 2019-09-23 | Disposition: A | Discharge status: Home / Self Care | Payer: Medicare Other | Source: Ambulatory Visit | Attending: Neurology | Admitting: Neurology

## 2019-09-23 ENCOUNTER — Other Ambulatory Visit: Payer: Self-pay

## 2019-09-23 DIAGNOSIS — G3189 Other specified degenerative diseases of nervous system: Secondary | ICD-10-CM | POA: Diagnosis not present

## 2019-09-23 DIAGNOSIS — I671 Cerebral aneurysm, nonruptured: Secondary | ICD-10-CM | POA: Diagnosis not present

## 2019-09-23 DIAGNOSIS — R9082 White matter disease, unspecified: Secondary | ICD-10-CM | POA: Diagnosis not present

## 2019-09-23 DIAGNOSIS — I6523 Occlusion and stenosis of bilateral carotid arteries: Secondary | ICD-10-CM | POA: Diagnosis not present

## 2019-09-23 DIAGNOSIS — Q278 Other specified congenital malformations of peripheral vascular system: Secondary | ICD-10-CM | POA: Diagnosis not present

## 2019-09-23 DIAGNOSIS — I6389 Other cerebral infarction: Secondary | ICD-10-CM | POA: Diagnosis not present

## 2019-09-23 DIAGNOSIS — G450 Vertebro-basilar artery syndrome: Secondary | ICD-10-CM

## 2019-09-23 MED ORDER — IOPAMIDOL (ISOVUE-370) INJECTION 76%
75.0000 mL | Freq: Once | INTRAVENOUS | Status: AC | PRN
  Administered 2019-09-23: 13:00:00 75 mL via INTRAVENOUS

## 2019-09-23 NOTE — Progress Notes (Signed)
Carelink Summary Report / Loop Recorder 

## 2019-09-24 ENCOUNTER — Other Ambulatory Visit: Payer: Self-pay

## 2019-09-24 ENCOUNTER — Telehealth: Payer: Self-pay

## 2019-09-24 DIAGNOSIS — G450 Vertebro-basilar artery syndrome: Secondary | ICD-10-CM

## 2019-09-24 DIAGNOSIS — I729 Aneurysm of unspecified site: Secondary | ICD-10-CM

## 2019-09-24 NOTE — Progress Notes (Signed)
Abigail Partridge, DO sent to Ranae Plumber, CMA        CTA of head does not reveal any cause for falls. I do not have an explanation for this. It does show an incidental finding. There is a 2.5 mm aneurysm. It is small with a low risk of rupture (about 2.5%), but there is still a risk. I would like to refer her to an endovascular specialist for further evaluation, Dr. Luanne Bras. He may recommend to just monitor it but I would appreciate his expert opinion since he treats this.

## 2019-09-24 NOTE — Telephone Encounter (Signed)
-----   Message from Pieter Partridge, DO sent at 09/24/2019  7:14 AM EDT ----- CTA of head does not reveal any cause for falls.  I do not have an explanation for this.  It does show an incidental finding.  There is a 2.5 mm aneurysm.  It is small with a low risk of rupture (about 2.5%), but there is still a risk.  I would like to refer her to an endovascular specialist for further evaluation, Dr. Luanne Bras.  He may recommend to just monitor it but I would appreciate his expert opinion since he treats this.

## 2019-09-24 NOTE — Telephone Encounter (Signed)
Spoke with patient she was informed of results and understands results.  Referral was placed and fax with confirmation received  Patient would like to know why she is dizzy all the time?

## 2019-09-24 NOTE — Telephone Encounter (Signed)
Spoke with patient she is aware of provider response and understands

## 2019-09-24 NOTE — Telephone Encounter (Signed)
Unfortunately, I do not have an answer

## 2019-09-24 NOTE — Progress Notes (Signed)
referral place Fax with records/ demographics

## 2019-09-28 ENCOUNTER — Other Ambulatory Visit: Payer: Self-pay

## 2019-09-28 ENCOUNTER — Encounter: Payer: Self-pay | Admitting: Internal Medicine

## 2019-09-28 ENCOUNTER — Telehealth (INDEPENDENT_AMBULATORY_CARE_PROVIDER_SITE_OTHER): Payer: Medicare Other | Admitting: Internal Medicine

## 2019-09-28 VITALS — Ht 64.0 in | Wt 133.0 lb

## 2019-09-28 DIAGNOSIS — I10 Essential (primary) hypertension: Secondary | ICD-10-CM | POA: Diagnosis not present

## 2019-09-28 DIAGNOSIS — I480 Paroxysmal atrial fibrillation: Secondary | ICD-10-CM

## 2019-09-28 NOTE — Progress Notes (Signed)
Electrophysiology TeleHealth Note  Due to national recommendations of social distancing due to [place] 19, an audio telehealth visit is felt to be most appropriate for this patient at this time.  Verbal consent was obtained by me for the telehealth visit today.  The patient does not have capability for a virtual visit.  A phone visit is therefore required today.   Date:  09/28/2019   ID:  Abigail Sullivan, Abigail Sullivan 1946/10/08, MRN [id number]  Location: patient's home  Provider location:  [place]  Evaluation Performed: Follow-up visit  PCP:  Marco Collie, MD   Electrophysiologist:  Dr Rayann Heman  Chief Complaint:  palpitations  History of Present Illness:    Abigail Sullivan is a 73 y.o. female who presents via telehealth conferencing today.  Since last being seen in our clinic, the patient reports doing very well.   She has frequent dizziness.  She has had several falls.  She has been to neurology.  She has been diagnosed with a cerebral aneurysm by CT 09/23/2019 (reviewed today) however this is not felt to be the cause for her falls/ dizziness.  She states that she is being referred to Dr Oneal Grout.    Her primary limitation is due to back pain.  Today, she denies symptoms of palpitations, chest pain, shortness of breath,  lower extremity edema, dizziness, presyncope, or syncope.  The patient is otherwise without complaint today.  The patient denies symptoms of fevers, chills, cough, or new SOB worrisome for COVID 19.  Past Medical History:  Diagnosis Date  . Atrial fibrillation (HCC)    post-operative   . Hepatitis   . Hyperlipidemia   . Hypertension   . Premature atrial contractions    systomatic PAC's and documented SVT ( likley atrial tachycardia )  . Stroke (cerebrum) [facility])     Past Surgical History:  Procedure Laterality Date  . HEMORROIDECTOMY    . LOOP RECORDER INSERTION N/A 09/16/2018   Procedure: LOOP RECORDER INSERTION;  Surgeon: Thompson Grayer, MD;  Location: [place]  CV LAB;  Service: Cardiovascular;  Laterality: N/A;  . LUMBAR LAMINECTOMY/DECOMPRESSION MICRODISCECTOMY  02/04/2011   L2-L3 and L3-L4 with diskectomy and plating  . SHOULDER ARTHROSCOPY W/ ROTATOR CUFF REPAIR Left     Current Outpatient Medications  Medication Sig Dispense Refill  . acetaminophen (TYLENOL) 500 MG tablet Take 1,500 mg by mouth daily as needed for moderate pain.    Marland Kitchen acyclovir (ZOVIRAX) 200 MG capsule Take 200 mg by mouth 2 (two) times daily.     . BELSOMRA 20 MG TABS TAKE 1 TABLET BY MOUTH AT BEDTIME 30 tablet 5  . diltiazem (TIAZAC) 180 MG 24 hr capsule Take 1 capsule (180 mg total) by mouth daily. 30 capsule 5  . ELIQUIS 5 MG TABS tablet TAKE 1 TABLET(5 MG) BY MOUTH TWICE DAILY 60 tablet 5  . HYDROcodone-acetaminophen (NORCO/VICODIN) 5-325 MG tablet Take 1 tablet by mouth every 6 (six) hours as needed for pain.    Marland Kitchen ibuprofen (ADVIL,MOTRIN) 200 MG tablet Take 600-800 mg by mouth daily as needed (back pain).    . LORazepam (ATIVAN) 1 MG tablet Take 1 mg by mouth at bedtime.     Marland Kitchen losartan (COZAAR) 25 MG tablet Take 25 mg by mouth daily.    . nitroGLYCERIN (NITROSTAT) 0.4 MG SL tablet Place 1 tablet under the tongue every 5 minutes. Max 3 doses. If no relief after 3 doses, call 911 25 tablet 3  . simvastatin (ZOCOR) 20 MG tablet Take  20 mg by mouth every evening.     . traMADol (ULTRAM) 50 MG tablet Take 50 mg by mouth every 6 (six) hours as needed.    . ursodiol (ACTIGALL) 300 MG capsule Take 300 mg by mouth 2 (two) times daily.       No current facility-administered medications for this visit.     Allergies:   Lisinopril and Lipitor [atorvastatin]   Social History:  The patient  reports that she quit smoking about 2 years ago. Her smoking use included cigarettes. She smoked 0.30 packs per day. She has never used smokeless tobacco. She reports that she does not drink alcohol or use drugs.   Family History:  The patient's family history includes Hypertension in her mother;  Other in her father.   ROS:  Please see the history of present illness.   All other systems are personally reviewed and negative.    Exam:    Vital Signs:  Ht 5\' 4"  (1.626 m)   Wt 133 lb (60.3 kg)   BMI 22.83 kg/m   Well sounding, alert and conversant   Labs/Other Tests and Data Reviewed:    Recent Labs: No results found for requested labs within last 8760 hours.   Wt Readings from Last 3 Encounters:  09/28/19 133 lb (60.3 kg)  07/17/19 127 lb (57.6 kg)  01/22/19 119 lb (54 kg)     Last device remote is reviewed from [place] PDF which reveals normal device function, afib 3.5%   ASSESSMENT & PLAN:    1.  Paroxysmal atrial fibrillation afib burden is 3.5% She is on eliquis for chads2vasc score of 5. ILR is reviewed  2. HTN Stable No change required today  3. Tobacco Remains quit for close to 3 years!  4. preop She asks about back surgery OK to proceed with back surgery without further CV sesting if medically indicated  Follow-up:  12 months with me  Patient Risk:  after full review of this patients clinical status, I feel that they are at moderate risk at this time.  Today, I have spent 15 minutes with the patient with telehealth technology discussing arrhythmia management .    SignedThompson Grayer, MD  09/28/2019 2:16 PM     [place] [address] [phone] (office) [phone] (fax)

## 2019-09-29 ENCOUNTER — Other Ambulatory Visit: Payer: Self-pay | Admitting: Neurosurgery

## 2019-09-29 ENCOUNTER — Other Ambulatory Visit (HOSPITAL_COMMUNITY): Payer: Self-pay | Admitting: Interventional Radiology

## 2019-09-29 ENCOUNTER — Other Ambulatory Visit (HOSPITAL_COMMUNITY): Payer: Self-pay | Admitting: Neurosurgery

## 2019-09-29 DIAGNOSIS — M545 Low back pain, unspecified: Secondary | ICD-10-CM

## 2019-09-29 DIAGNOSIS — I729 Aneurysm of unspecified site: Secondary | ICD-10-CM

## 2019-10-02 DIAGNOSIS — N182 Chronic kidney disease, stage 2 (mild): Secondary | ICD-10-CM | POA: Diagnosis not present

## 2019-10-02 DIAGNOSIS — E782 Mixed hyperlipidemia: Secondary | ICD-10-CM | POA: Diagnosis not present

## 2019-10-02 DIAGNOSIS — I129 Hypertensive chronic kidney disease with stage 1 through stage 4 chronic kidney disease, or unspecified chronic kidney disease: Secondary | ICD-10-CM | POA: Diagnosis not present

## 2019-10-02 DIAGNOSIS — K754 Autoimmune hepatitis: Secondary | ICD-10-CM | POA: Diagnosis not present

## 2019-10-13 ENCOUNTER — Ambulatory Visit (HOSPITAL_COMMUNITY)
Admission: RE | Admit: 2019-10-13 | Discharge: 2019-10-13 | Disposition: A | Discharge status: Home / Self Care | Payer: Medicare Other | Source: Ambulatory Visit | Attending: Interventional Radiology | Admitting: Interventional Radiology

## 2019-10-13 ENCOUNTER — Other Ambulatory Visit: Payer: Self-pay

## 2019-10-13 DIAGNOSIS — I671 Cerebral aneurysm, nonruptured: Secondary | ICD-10-CM | POA: Diagnosis not present

## 2019-10-13 DIAGNOSIS — I729 Aneurysm of unspecified site: Secondary | ICD-10-CM

## 2019-10-13 NOTE — Consult Note (Signed)
Chief Complaint: Patient was seen in consultation today for right PCOM aneurysm.  Referring Physician(s): Pieter Partridge  Supervising Physician: Luanne Bras  Patient Status: [facility] - Out-pt  History of Present Illness: Abigail Sullivan is a 73 y.o. female with a past medical history as below, with pertinent past medical history including hypertension, hyperlipidemia, PACs, paroxysmal atrial fibrillation on chronic anticoagulation with Eliquis, CVA, autoimmune hepatitis, and chronic low back pain. She has had intermittent dizziness since at least 2018 per charts. She has been seen by Dr. Felecia Shelling and Dr. Tomi Likens (neurology) for management who ordered appropriate imaging scans for further evaluation.  MR brain 08/25/2019: 1. No evidence of acute intracranial abnormality. 2. Moderate chronic small vessel ischemic disease. Redemonstrated chronic infarcts within the right frontoparietal, left parietal and left occipital lobes. 3. Mild generalized parenchymal atrophy.  CTA head/neck 09/23/2019: 1. 2.5 mm right posterior communicating artery aneurysm. 2. Mild atherosclerotic changes within the cavernous internal carotid arteries bilaterally without significant stenosis relative to the more distal vessels. 3. Otherwise normal CTA Circle of Willis without significant proximal stenosis, aneurysm, or branch vessel occlusion.  IR requested by Dr. Tomi Likens for management of right PCOM aneurysm. Patient awake and alert sitting in chair. Accompanied by husband. Complains of constant dizziness x years. States that sometimes dizziness is so severe that she sometimes falls (last fall 6 months ago). States that she does not lose consciousness or experience vision changes when dizzy. States she ambulates with cane due to chronic low back pain. Denies headache, weakness, numbness/tingling, vision changes, hearing changes, tinnitus, or speech difficulty.   Past Medical History:  Diagnosis Date   Atrial  fibrillation ([place])    post-operative    Hepatitis    Hyperlipidemia    Hypertension    Premature atrial contractions    systomatic PAC's and documented SVT ( likley atrial tachycardia )   Stroke (cerebrum) ([place])     Past Surgical History:  Procedure Laterality Date   HEMORROIDECTOMY     LOOP RECORDER INSERTION N/A 09/16/2018   Procedure: LOOP RECORDER INSERTION;  Surgeon: Thompson Grayer, MD;  Location: [place] CV LAB;  Service: Cardiovascular;  Laterality: N/A;   LUMBAR LAMINECTOMY/DECOMPRESSION MICRODISCECTOMY  02/04/2011   L2-L3 and L3-L4 with diskectomy and plating   SHOULDER ARTHROSCOPY W/ ROTATOR CUFF REPAIR Left     Allergies: Lisinopril and Lipitor [atorvastatin]  Medications: Prior to Admission medications   Medication Sig Start Date End Date Taking? Authorizing Provider  acetaminophen (TYLENOL) 500 MG tablet Take 1,500 mg by mouth daily as needed for moderate pain.    [provider]  acyclovir (ZOVIRAX) 200 MG capsule Take 200 mg by mouth 2 (two) times daily.     [provider]  BELSOMRA 20 MG TABS TAKE 1 TABLET BY MOUTH AT BEDTIME 03/30/19   Sater, Nanine Means, MD  diltiazem [facility]) 180 MG 24 hr capsule Take 1 capsule (180 mg total) by mouth daily. 05/11/19   Shirley Friar, PA-C  ELIQUIS 5 MG TABS tablet TAKE 1 TABLET(5 MG) BY MOUTH TWICE DAILY 08/18/19   Allred, Jeneen Rinks, MD  HYDROcodone-acetaminophen (NORCO/VICODIN) 5-325 MG tablet Take 1 tablet by mouth every 6 (six) hours as needed for pain. 09/21/19   [provider]  ibuprofen (ADVIL,MOTRIN) 200 MG tablet Take 600-800 mg by mouth daily as needed (back pain).    [provider]  LORazepam (ATIVAN) 1 MG tablet Take 1 mg by mouth at bedtime.  01/06/19   [provider]  losartan (COZAAR) 25  MG tablet Take 25 mg by mouth daily.    [provider]  nitroGLYCERIN (NITROSTAT) 0.4 MG SL tablet Place 1 tablet under the tongue every 5 minutes. Max 3 doses. If  no relief after 3 doses, call 911 09/10/18   Allred, Jeneen Rinks, MD  simvastatin (ZOCOR) 20 MG tablet Take 20 mg by mouth every evening.     [provider]  traMADol (ULTRAM) 50 MG tablet Take 50 mg by mouth every 6 (six) hours as needed. 09/15/19   [provider]  ursodiol (ACTIGALL) 300 MG capsule Take 300 mg by mouth 2 (two) times daily.      [provider]     Family History  Problem Relation Age of Onset   Hypertension Mother    Other Father        CABGx4    Social History   Socioeconomic History   Marital status: Married    Spouse name: Ed   Number of children: Not on file   Years of education: 12   Highest education level: 12th grade  Occupational History   Occupation: retired  Scientist, product/process development strain: Not on file   Food insecurity    Worry: Not on file    Inability: Not on Lexicographer needs    Medical: Not on file    Non-medical: Not on file  Tobacco Use   Smoking status: Former Smoker    Packs/day: 0.30    Types: Cigarettes    Quit date: 12/22/2016    Years since quitting: 2.8   Smokeless tobacco: Never Used   Tobacco comment: smokes 3 cigarettes per day, not interested in quitting  Substance and Sexual Activity   Alcohol use: No   Drug use: No   Sexual activity: Not on file  Lifestyle   Physical activity    Days per week: Not on file    Minutes per session: Not on file   Stress: Not on file  Relationships   Social connections    Talks on phone: Not on file    Gets together: Not on file    Attends religious service: Not on file    Active member of club or organization: Not on file    Attends meetings of clubs or organizations: Not on file    Relationship status: Not on file  Other Topics Concern   Not on file  Social History Narrative   Patient is right-handed.     Review of Systems: A 12 point ROS discussed and pertinent positives are indicated in the HPI above.  All other  systems are negative.  Review of Systems  Constitutional: Negative for chills and fever.  HENT: Negative for hearing loss and tinnitus.   Eyes: Negative for visual disturbance.  Respiratory: Negative for shortness of breath and wheezing.   Cardiovascular: Negative for chest pain and palpitations.  Musculoskeletal: Positive for back pain.  Neurological: Positive for dizziness. Negative for syncope, speech difficulty, weakness, numbness and headaches.  Psychiatric/Behavioral: Negative for behavioral problems and confusion.     Physical Exam Constitutional:      General: She is not in acute distress.    Appearance: Normal appearance.  Pulmonary:     Effort: Pulmonary effort is normal. No respiratory distress.  Skin:    General: Skin is warm and dry.  Neurological:     Mental Status: She is alert and oriented to person, place, and time.  Psychiatric:  Mood and Affect: Mood normal.        Behavior: Behavior normal.        Thought Content: Thought content normal.        Judgment: Judgment normal.      Imaging: Ct Angio Head W Or Wo Contrast  Result Date: 09/23/2019 CLINICAL DATA:  Dizziness and falling. Vertebrobasilar insufficiency. EXAM: CT ANGIOGRAPHY HEAD TECHNIQUE: Multidetector CT imaging of the head was performed using the standard protocol during bolus administration of intravenous contrast. Multiplanar CT image reconstructions and MIPs were obtained to evaluate the vascular anatomy. CONTRAST:  34mL ISOVUE-370 IOPAMIDOL (ISOVUE-370) INJECTION 76% COMPARISON:  MR head without contrast 08/25/2019. MRA of the neck and head 02/16/2018. FINDINGS: CT HEAD Brain: Remote infarcts are again noted in the posterior right frontal and parietal lobe as well as the medial inferior left occipital lobe. No new infarcts are present. There is no acute hemorrhage or mass lesion. White matter disease is otherwise stable. The ventricles are proportionate to the degree of atrophy. No  significant extraaxial fluid collection is present. Vascular: No hyperdense vessel or unexpected calcification. Skull: Calvarium is intact. No focal lytic or blastic lesions are present. Sinuses: The paranasal sinuses and mastoid air cells are clear. Orbits: Bilateral lens replacements are noted. Globes and orbits are otherwise unremarkable. CTA HEAD Anterior circulation: A 2.5 mm right posterior communicating artery aneurysm is present. There is an infundibulum of the left posterior communicating artery. Mild atherosclerotic changes are noted within the cavernous internal carotid arteries bilaterally without significant stenosis relative to the more distal vessels. ICA termini are within normal limits. The A1 and M1 segments are normal. MCA bifurcations are intact. ACA and MCA branch vessels are within normal limits. Posterior circulation: Right vertebral artery is the dominant vessel. The left vertebral artery is hypoplastic and centrally terminates at the PICA. Basilar artery is normal. Both posterior cerebral arteries originate from basilar tip. PCA branch vessels are within normal limits bilaterally. There is no significant stenosis. Venous sinuses: Dural sinuses are patent. The straight sinus deep cerebral veins are intact. Cortical veins are unremarkable. No focal vascular malformation is present. Anatomic variants: None IMPRESSION: 1. 2.5 mm right posterior communicating artery aneurysm. 2. Mild atherosclerotic changes within the cavernous internal carotid arteries bilaterally without significant stenosis relative to the more distal vessels. 3. Otherwise normal CTA Circle of Willis without significant proximal stenosis, aneurysm, or branch vessel occlusion. Electronically Signed   By: San Morelle M.D.   On: 09/23/2019 15:32    Assessment and Plan:  Right PCOM aneurysm. Dr. Estanislado Pandy was present for consultation. Discussed CTA results with patient, including incidental finding of right PCOM  aneurysm. Explained that this aneurysm would not cause her symptom of dizziness (unless ruptured). Discussed nature of aneurysms, including risk of rupture. Explained that there are two management options for her aneurysm at this time- either conservative management with routine imaging scans to monitor for changes, or with an endovascular embolization procedure. Discussed procedure, including risks and benefits. Explained that if we moved forward with procedure, she will require cardiac clearance prior due to history of atrial fibrillation on chronic anticoagulation with Eliquis. Patient states that at this time her primary focus is her back pain- states that she was born with a spinal curvature and has had multiple surgeries for this and has plans for an additional surgery with Dr. Vertell Limber. Instructed patient to focus on her back pain at this time, and to call our office once back pain managed to discuss management options  for PCOM aneurysm.  Plan for follow-up PRN. Instructed patient to call our office once her back pain is well controlled/once she has back surgery, and we will further discuss management options at that time.  All questions answered and concerns addressed. Patient and husband convey understanding and agree with plan.  Thank you for this interesting consult.  I greatly enjoyed meeting Abigail Sullivan and look forward to participating in their care.  A copy of this report was sent to the requesting provider on this date.  Electronically Signed: Earley Abide, PA-C 10/13/2019, 9:16 AM   I spent a total of 40 Minutes in face to face in clinical consultation, greater than 50% of which was counseling/coordinating care for right PCOM aneurysm.

## 2019-10-17 ENCOUNTER — Ambulatory Visit
Admission: RE | Admit: 2019-10-17 | Discharge: 2019-10-17 | Disposition: A | Discharge status: Home / Self Care | Payer: Medicare Other | Source: Ambulatory Visit | Attending: Neurosurgery | Admitting: Neurosurgery

## 2019-10-17 ENCOUNTER — Other Ambulatory Visit: Payer: Self-pay

## 2019-10-17 DIAGNOSIS — M545 Low back pain, unspecified: Secondary | ICD-10-CM

## 2019-10-17 DIAGNOSIS — M5127 Other intervertebral disc displacement, lumbosacral region: Secondary | ICD-10-CM | POA: Diagnosis not present

## 2019-10-17 LAB — CUP PACEART REMOTE DEVICE CHECK
Date Time Interrogation Session: 20201114194129
Implantable Pulse Generator Implant Date: 20191015

## 2019-10-17 MED ORDER — GADOBENATE DIMEGLUMINE 529 MG/ML IV SOLN
12.0000 mL | Freq: Once | INTRAVENOUS | Status: AC | PRN
  Administered 2019-10-17: 12 mL via INTRAVENOUS

## 2019-10-19 ENCOUNTER — Ambulatory Visit (INDEPENDENT_AMBULATORY_CARE_PROVIDER_SITE_OTHER): Payer: Medicare Other | Admitting: *Deleted

## 2019-10-19 ENCOUNTER — Telehealth: Payer: Self-pay | Admitting: *Deleted

## 2019-10-19 DIAGNOSIS — I671 Cerebral aneurysm, nonruptured: Secondary | ICD-10-CM | POA: Insufficient documentation

## 2019-10-19 DIAGNOSIS — M545 Low back pain: Secondary | ICD-10-CM | POA: Diagnosis not present

## 2019-10-19 DIAGNOSIS — M4120 Other idiopathic scoliosis, site unspecified: Secondary | ICD-10-CM | POA: Diagnosis not present

## 2019-10-19 DIAGNOSIS — I471 Supraventricular tachycardia: Secondary | ICD-10-CM

## 2019-10-19 DIAGNOSIS — I480 Paroxysmal atrial fibrillation: Secondary | ICD-10-CM

## 2019-10-19 DIAGNOSIS — M5416 Radiculopathy, lumbar region: Secondary | ICD-10-CM | POA: Diagnosis not present

## 2019-10-19 DIAGNOSIS — M48061 Spinal stenosis, lumbar region without neurogenic claudication: Secondary | ICD-10-CM | POA: Diagnosis not present

## 2019-10-19 DIAGNOSIS — I10 Essential (primary) hypertension: Secondary | ICD-10-CM | POA: Diagnosis not present

## 2019-10-19 NOTE — Telephone Encounter (Signed)
Patient with diagnosis of afib on Eliquis for anticoagulation.    Procedure: EPIDURAL STEROID INJECTION  Date of procedure: 10/28/2019  CHADS2-VASc score of  5 (HTN, AGE, stroke/tia x 2, female)  CrCl 50 ml/min  Office policy would be to hold anticoagulation 3 days due to spinal injection. However, patient is high risk off anticoagulation due to history of stroke. I will defer to Dr. Rayann Heman

## 2019-10-19 NOTE — Telephone Encounter (Signed)
   [facility] Pre-operative Risk Assessment    Request for surgical clearance:  1. What type of surgery is being performed? EPIDURAL STEROID INJECTION   2. When is this surgery scheduled? 10/28/19   3. What type of clearance is required (medical clearance vs. Pharmacy clearance to hold med vs. Both)? BOTH  4. Are there any medications that need to be held prior to surgery and how long? ELIQUIS 3 DAYS PRIOR   5. Practice name and name of physician performing surgery? [facility]; DR. PAUL HARKINS   6. What is your office phone number [phone]    7.   What is your office fax number [phone]  8.   Anesthesia type (None, local, MAC, general) ? NOT LISTED; LOCAL    Abigail Sullivan 10/19/2019, 12:20 PM  _________________________________________________________________   (provider comments below)

## 2019-10-19 NOTE — Telephone Encounter (Signed)
Can you please comment on how long patient should hold Eliquis prior to epidural steroid injection?  Thank you!

## 2019-10-21 NOTE — Telephone Encounter (Signed)
Our office received clearance request x 3 today. I left a message for Dominque CMA for Dr. Maryjean Ka, we did receive clearance request on 10/19/19 and is in process. Per Pharm D we have deferred to Dr. Rayann Heman for input in regards to holding Eliquis. Once we have clearance from Dr. Rayann Heman we will send clearance notes.

## 2019-10-25 ENCOUNTER — Other Ambulatory Visit: Payer: Self-pay | Admitting: Student

## 2019-10-25 DIAGNOSIS — I480 Paroxysmal atrial fibrillation: Secondary | ICD-10-CM

## 2019-10-26 DIAGNOSIS — H04123 Dry eye syndrome of bilateral lacrimal glands: Secondary | ICD-10-CM | POA: Diagnosis not present

## 2019-10-26 DIAGNOSIS — Z961 Presence of intraocular lens: Secondary | ICD-10-CM | POA: Diagnosis not present

## 2019-10-26 DIAGNOSIS — H5213 Myopia, bilateral: Secondary | ICD-10-CM | POA: Diagnosis not present

## 2019-10-26 DIAGNOSIS — H52203 Unspecified astigmatism, bilateral: Secondary | ICD-10-CM | POA: Diagnosis not present

## 2019-10-26 DIAGNOSIS — H40013 Open angle with borderline findings, low risk, bilateral: Secondary | ICD-10-CM | POA: Diagnosis not present

## 2019-10-26 DIAGNOSIS — H53461 Homonymous bilateral field defects, right side: Secondary | ICD-10-CM | POA: Diagnosis not present

## 2019-10-28 DIAGNOSIS — M48061 Spinal stenosis, lumbar region without neurogenic claudication: Secondary | ICD-10-CM | POA: Diagnosis not present

## 2019-10-29 NOTE — Telephone Encounter (Signed)
Ok to hold anticoagulation for 3 days if medically necessary and resume as soon as able afterwards.

## 2019-10-30 NOTE — Telephone Encounter (Signed)
   Primary Cardiologist: No primary care provider on file.  Electrophysiologist:  Abigail Grayer, MD  Chart reviewed as part of pre-operative protocol coverage. Given past medical history and time since last visit, based on ACC/AHA guidelines, Abigail Sullivan would be at acceptable risk for the planned procedure without further cardiovascular testing.   OK to hold anticoagulation, Eliquis, for 3 days prior to procedure if medically necessary and resume as soon as able afterwards.   I will route this recommendation to the requesting party via Epic fax function and remove from pre-op pool.  Please call with questions.  Daune Perch, NP 10/30/2019, 8:24 AM

## 2019-11-02 DIAGNOSIS — K754 Autoimmune hepatitis: Secondary | ICD-10-CM | POA: Diagnosis not present

## 2019-11-02 DIAGNOSIS — N182 Chronic kidney disease, stage 2 (mild): Secondary | ICD-10-CM | POA: Diagnosis not present

## 2019-11-02 DIAGNOSIS — I129 Hypertensive chronic kidney disease with stage 1 through stage 4 chronic kidney disease, or unspecified chronic kidney disease: Secondary | ICD-10-CM | POA: Diagnosis not present

## 2019-11-02 DIAGNOSIS — E782 Mixed hyperlipidemia: Secondary | ICD-10-CM | POA: Diagnosis not present

## 2019-11-03 DIAGNOSIS — Z86010 Personal history of colonic polyps: Secondary | ICD-10-CM | POA: Diagnosis not present

## 2019-11-03 DIAGNOSIS — K743 Primary biliary cirrhosis: Secondary | ICD-10-CM | POA: Diagnosis not present

## 2019-11-12 ENCOUNTER — Encounter: Payer: Self-pay | Admitting: Gastroenterology

## 2019-11-13 DIAGNOSIS — E782 Mixed hyperlipidemia: Secondary | ICD-10-CM | POA: Diagnosis not present

## 2019-11-13 DIAGNOSIS — K754 Autoimmune hepatitis: Secondary | ICD-10-CM | POA: Diagnosis not present

## 2019-11-13 DIAGNOSIS — I129 Hypertensive chronic kidney disease with stage 1 through stage 4 chronic kidney disease, or unspecified chronic kidney disease: Secondary | ICD-10-CM | POA: Diagnosis not present

## 2019-11-13 NOTE — Progress Notes (Signed)
Carelink Summary Report / Loop Recorder 

## 2019-11-16 DIAGNOSIS — G4700 Insomnia, unspecified: Secondary | ICD-10-CM | POA: Diagnosis not present

## 2019-11-19 ENCOUNTER — Ambulatory Visit (INDEPENDENT_AMBULATORY_CARE_PROVIDER_SITE_OTHER): Payer: Medicare Other | Admitting: *Deleted

## 2019-11-19 DIAGNOSIS — I480 Paroxysmal atrial fibrillation: Secondary | ICD-10-CM

## 2019-11-20 LAB — CUP PACEART REMOTE DEVICE CHECK
Date Time Interrogation Session: 20201217180651
Implantable Pulse Generator Implant Date: 20191015

## 2019-12-18 DIAGNOSIS — M5416 Radiculopathy, lumbar region: Secondary | ICD-10-CM | POA: Diagnosis not present

## 2019-12-18 DIAGNOSIS — M48061 Spinal stenosis, lumbar region without neurogenic claudication: Secondary | ICD-10-CM | POA: Diagnosis not present

## 2019-12-22 ENCOUNTER — Ambulatory Visit (INDEPENDENT_AMBULATORY_CARE_PROVIDER_SITE_OTHER): Payer: Medicare Other | Admitting: *Deleted

## 2019-12-22 DIAGNOSIS — I480 Paroxysmal atrial fibrillation: Secondary | ICD-10-CM

## 2019-12-23 LAB — CUP PACEART REMOTE DEVICE CHECK
Date Time Interrogation Session: 20210119180753
Implantable Pulse Generator Implant Date: 20191015

## 2019-12-31 ENCOUNTER — Other Ambulatory Visit: Payer: Self-pay | Admitting: Gastroenterology

## 2019-12-31 DIAGNOSIS — K743 Primary biliary cirrhosis: Secondary | ICD-10-CM

## 2020-01-01 DIAGNOSIS — K745 Biliary cirrhosis, unspecified: Secondary | ICD-10-CM | POA: Diagnosis not present

## 2020-01-01 DIAGNOSIS — E782 Mixed hyperlipidemia: Secondary | ICD-10-CM | POA: Diagnosis not present

## 2020-01-01 DIAGNOSIS — F3110 Bipolar disorder, current episode manic without psychotic features, unspecified: Secondary | ICD-10-CM | POA: Diagnosis not present

## 2020-01-01 DIAGNOSIS — I10 Essential (primary) hypertension: Secondary | ICD-10-CM | POA: Diagnosis not present

## 2020-01-04 ENCOUNTER — Telehealth: Payer: Self-pay | Admitting: Internal Medicine

## 2020-01-04 MED ORDER — DILTIAZEM HCL ER COATED BEADS 180 MG PO CP24: 180.0000 mg | ORAL_CAPSULE | Freq: Every day | ORAL | 3 refills | Status: DC

## 2020-01-04 NOTE — Telephone Encounter (Signed)
Spoke with pt, she states her insurance company will no longer cover Diltiazem as ordered.  Pt states medication will need to be ordered as Diltiazem HCL-ER or Cartia XT.  She states insurance company advised her, medication is being "discontinued."  Please order as a 90 day supply.  Pt advised information will be forwarded to Dr Rayann Heman and his RN for review.

## 2020-01-04 NOTE — Telephone Encounter (Signed)
Pt calling stating that her insurance will no longer cover diltiazem and would like to be switch to another medication. Pt would like a call back concerning this matter. Please address

## 2020-01-04 NOTE — Telephone Encounter (Signed)
Follow up:     Patient calling to check the status of her message from earlier. Please call patient.

## 2020-01-04 NOTE — Telephone Encounter (Signed)
Sent order for cartia XT to patient's pharmacy as requested.

## 2020-01-06 DIAGNOSIS — G4700 Insomnia, unspecified: Secondary | ICD-10-CM | POA: Diagnosis not present

## 2020-01-11 ENCOUNTER — Ambulatory Visit
Admission: RE | Admit: 2020-01-11 | Discharge: 2020-01-11 | Disposition: A | Discharge status: Home / Self Care | Payer: Medicare Other | Source: Ambulatory Visit | Attending: Gastroenterology | Admitting: Gastroenterology

## 2020-01-11 DIAGNOSIS — K862 Cyst of pancreas: Secondary | ICD-10-CM | POA: Diagnosis not present

## 2020-01-11 DIAGNOSIS — K743 Primary biliary cirrhosis: Secondary | ICD-10-CM

## 2020-01-11 MED ORDER — IOPAMIDOL (ISOVUE-300) INJECTION 61%
100.0000 mL | Freq: Once | INTRAVENOUS | Status: AC | PRN
  Administered 2020-01-11: 100 mL via INTRAVENOUS

## 2020-01-13 DIAGNOSIS — M48061 Spinal stenosis, lumbar region without neurogenic claudication: Secondary | ICD-10-CM | POA: Diagnosis not present

## 2020-01-13 DIAGNOSIS — M4120 Other idiopathic scoliosis, site unspecified: Secondary | ICD-10-CM | POA: Diagnosis not present

## 2020-01-13 DIAGNOSIS — Z79891 Long term (current) use of opiate analgesic: Secondary | ICD-10-CM | POA: Diagnosis not present

## 2020-01-25 ENCOUNTER — Ambulatory Visit (INDEPENDENT_AMBULATORY_CARE_PROVIDER_SITE_OTHER): Payer: Medicare Other | Admitting: *Deleted

## 2020-01-25 DIAGNOSIS — I480 Paroxysmal atrial fibrillation: Secondary | ICD-10-CM | POA: Diagnosis not present

## 2020-01-25 LAB — CUP PACEART REMOTE DEVICE CHECK
Date Time Interrogation Session: 20210222004421
Implantable Pulse Generator Implant Date: 20191015

## 2020-01-25 NOTE — Progress Notes (Signed)
ILR Remote 

## 2020-01-31 DIAGNOSIS — E782 Mixed hyperlipidemia: Secondary | ICD-10-CM | POA: Diagnosis not present

## 2020-01-31 DIAGNOSIS — I10 Essential (primary) hypertension: Secondary | ICD-10-CM | POA: Diagnosis not present

## 2020-01-31 DIAGNOSIS — K745 Biliary cirrhosis, unspecified: Secondary | ICD-10-CM | POA: Diagnosis not present

## 2020-01-31 DIAGNOSIS — F3110 Bipolar disorder, current episode manic without psychotic features, unspecified: Secondary | ICD-10-CM | POA: Diagnosis not present

## 2020-02-14 ENCOUNTER — Other Ambulatory Visit: Payer: Self-pay | Admitting: Internal Medicine

## 2020-02-15 NOTE — Telephone Encounter (Signed)
Pt last saw Dr Rayann Heman 09/28/19 telemedicine Covid-19, last labs 11/13/19 Creat 0.92 at [facility] per [facility], age 74, weight 60.3kg, based on specified criteria pt is on appropriate dosage of Eliquis 5mg  BID.  Will refill rx.

## 2020-02-25 ENCOUNTER — Ambulatory Visit (INDEPENDENT_AMBULATORY_CARE_PROVIDER_SITE_OTHER): Payer: Medicare Other | Admitting: *Deleted

## 2020-02-25 DIAGNOSIS — I480 Paroxysmal atrial fibrillation: Secondary | ICD-10-CM

## 2020-02-25 LAB — CUP PACEART REMOTE DEVICE CHECK
Date Time Interrogation Session: 20210325014506
Implantable Pulse Generator Implant Date: 20191015

## 2020-02-25 NOTE — Progress Notes (Signed)
ILR Remote 

## 2020-03-02 ENCOUNTER — Other Ambulatory Visit: Payer: Self-pay | Admitting: Student

## 2020-03-02 DIAGNOSIS — E782 Mixed hyperlipidemia: Secondary | ICD-10-CM | POA: Diagnosis not present

## 2020-03-02 DIAGNOSIS — F3110 Bipolar disorder, current episode manic without psychotic features, unspecified: Secondary | ICD-10-CM | POA: Diagnosis not present

## 2020-03-02 DIAGNOSIS — K745 Biliary cirrhosis, unspecified: Secondary | ICD-10-CM | POA: Diagnosis not present

## 2020-03-02 DIAGNOSIS — I10 Essential (primary) hypertension: Secondary | ICD-10-CM | POA: Diagnosis not present

## 2020-03-02 DIAGNOSIS — I480 Paroxysmal atrial fibrillation: Secondary | ICD-10-CM

## 2020-03-11 DIAGNOSIS — M5410 Radiculopathy, site unspecified: Secondary | ICD-10-CM | POA: Diagnosis not present

## 2020-03-11 DIAGNOSIS — Z6820 Body mass index (BMI) 20.0-20.9, adult: Secondary | ICD-10-CM | POA: Diagnosis not present

## 2020-03-11 DIAGNOSIS — R0781 Pleurodynia: Secondary | ICD-10-CM | POA: Diagnosis not present

## 2020-03-11 DIAGNOSIS — W19XXXA Unspecified fall, initial encounter: Secondary | ICD-10-CM | POA: Diagnosis not present

## 2020-03-14 DIAGNOSIS — K754 Autoimmune hepatitis: Secondary | ICD-10-CM | POA: Diagnosis not present

## 2020-03-14 DIAGNOSIS — R0781 Pleurodynia: Secondary | ICD-10-CM | POA: Diagnosis not present

## 2020-03-14 DIAGNOSIS — S4991XA Unspecified injury of right shoulder and upper arm, initial encounter: Secondary | ICD-10-CM | POA: Diagnosis not present

## 2020-03-14 DIAGNOSIS — M25511 Pain in right shoulder: Secondary | ICD-10-CM | POA: Diagnosis not present

## 2020-03-14 DIAGNOSIS — E782 Mixed hyperlipidemia: Secondary | ICD-10-CM | POA: Diagnosis not present

## 2020-03-14 DIAGNOSIS — S2241XA Multiple fractures of ribs, right side, initial encounter for closed fracture: Secondary | ICD-10-CM | POA: Diagnosis not present

## 2020-03-16 ENCOUNTER — Encounter: Payer: Self-pay | Admitting: Physical Medicine and Rehabilitation

## 2020-03-16 DIAGNOSIS — M4120 Other idiopathic scoliosis, site unspecified: Secondary | ICD-10-CM | POA: Diagnosis not present

## 2020-03-16 DIAGNOSIS — S2249XA Multiple fractures of ribs, unspecified side, initial encounter for closed fracture: Secondary | ICD-10-CM | POA: Diagnosis not present

## 2020-03-21 DIAGNOSIS — N182 Chronic kidney disease, stage 2 (mild): Secondary | ICD-10-CM | POA: Diagnosis not present

## 2020-03-21 DIAGNOSIS — I129 Hypertensive chronic kidney disease with stage 1 through stage 4 chronic kidney disease, or unspecified chronic kidney disease: Secondary | ICD-10-CM | POA: Diagnosis not present

## 2020-03-21 DIAGNOSIS — K745 Biliary cirrhosis, unspecified: Secondary | ICD-10-CM | POA: Diagnosis not present

## 2020-03-21 DIAGNOSIS — K754 Autoimmune hepatitis: Secondary | ICD-10-CM | POA: Diagnosis not present

## 2020-03-29 ENCOUNTER — Ambulatory Visit (INDEPENDENT_AMBULATORY_CARE_PROVIDER_SITE_OTHER): Payer: Medicare Other | Admitting: *Deleted

## 2020-03-29 DIAGNOSIS — I480 Paroxysmal atrial fibrillation: Secondary | ICD-10-CM

## 2020-03-29 LAB — CUP PACEART REMOTE DEVICE CHECK
Date Time Interrogation Session: 20210425014511
Implantable Pulse Generator Implant Date: 20191015

## 2020-03-30 ENCOUNTER — Encounter
Payer: Medicare Other | Attending: Physical Medicine and Rehabilitation | Admitting: Physical Medicine and Rehabilitation

## 2020-03-30 ENCOUNTER — Other Ambulatory Visit: Payer: Self-pay

## 2020-03-30 ENCOUNTER — Encounter: Payer: Self-pay | Admitting: Physical Medicine and Rehabilitation

## 2020-03-30 VITALS — BP 152/89 | HR 93 | Temp 97.7°F | Ht 63.0 in | Wt 118.0 lb

## 2020-03-30 DIAGNOSIS — M5386 Other specified dorsopathies, lumbar region: Secondary | ICD-10-CM | POA: Diagnosis not present

## 2020-03-30 DIAGNOSIS — R29818 Other symptoms and signs involving the nervous system: Secondary | ICD-10-CM | POA: Diagnosis not present

## 2020-03-30 DIAGNOSIS — R269 Unspecified abnormalities of gait and mobility: Secondary | ICD-10-CM | POA: Diagnosis not present

## 2020-03-30 MED ORDER — PREGABALIN 25 MG PO CAPS: 25.0000 mg | ORAL_CAPSULE | Freq: Three times a day (TID) | ORAL | 0 refills | Status: DC

## 2020-03-30 MED ORDER — PREGABALIN 50 MG PO CAPS: 50.0000 mg | ORAL_CAPSULE | Freq: Three times a day (TID) | ORAL | 3 refills | Status: DC

## 2020-03-30 NOTE — Patient Instructions (Signed)
Patient is a 74 yr old female with hx of "2 strokes, prevention of additional strokes, on Eliquis, Afib, sees cardiology, HTN, has severe scoliosis- here for RLE pain evaluation.  Poor proprioception -poor balance   1. Lyrica- 25 mg nightly x 3 days then 25 mg 2x/day x 3 days then 3x/day-finish 2 week supply.   2. The increase Lyrica to 50 mg 3x/day- for nerve pain.   3. Don't take pain meds unless has pain.   4. My hope is to reduce oxycodone/tylenol medicine.   5. Pain doesn't improve on lyrica until on 50 mg 3x/day for 4-7 days- will gradually improve- call me ~ 1 month if things aren't better.   6. F/U in 6 weeks.   7. Use cane more.

## 2020-03-30 NOTE — Progress Notes (Signed)
Subjective:    Patient ID: Abigail Sullivan, female    DOB: [birth year], 74 y.o.   MRN: [id number]  HPI  Patient is a 74 yr old female with hx of "2 strokes, prevention of additional strokes, on Eliquis, Afib, sees cardiology, HTN, has severe scoliosis- here for RLE pain evaluation.   Lumbar spine-"disc pressing on R disc".   Pressing on nerves on RLE Pain down from knee to ankle on RLE  Hx of pins/screws on "Left side" Dr Vertell Limber doesn't want to do it because says it's "major hx".  Dr Vertell Limber wants her on Oxycodone- percocet-7.5/325 2 pills/BID  Does take care of pain- on it 2 pills- 2x/day-  There are days she feels could take 3x/day On good days get 6-7 hours of relief.  Been on it 5 months or so.    Fell 3 weeks ago- cracked 4 ribs Family doctor gave her Dilaudid- short term for fractures.   So, Dr Nyra Capes thought we might be helpful, so could get off opioids.  Can't function, can't walk, cannot sleep.   No burning, like nerve being squeezing it as hard as you can.  When disc presses on nerves, hurts SO bad.   Poor response to gabapentin- got terrible pain in quads B/L Couldn't walk- thought from gabapentin- improved off medicine.  Helped her sleep, but couldn't walk.  Lyrica- helped some, before first back surgery.  Lidoderm patch- didn't help much- did stay in place Never taken Duloxetine for nerve pain-  No depression, or other mental health issues   Didn't have pill from 11:30 am yesterday until this AM.   No pain til this AM Needed husband to help her walk ~ 5-6pm - no increased pain, no pain was bothering her, but cannot move around by self; for last 5-6 days.   if moves on her own, feels like will faint-keel over- feels like will fall- feels off balance. Can't walk well- will inch feet forward a few inches at a time.  When this occurs.  Can go all day, but comes 5-6pm at night, starts- watching TV, but gets up and needs more help.  Always sitting- watching TV- when it  occurs- denies stiffness- Doesn't feel lightheaded or dizzy. Feels unbalanced.  Things aren't spinning. "bizarre".    Pain Inventory Average Pain 10 Pain Right Now 5 My pain is stabbing  In the last 24 hours, has pain interfered with the following? General activity 10 Relation with others 5 Enjoyment of life 10 What TIME of day is your pain at its worst? all Sleep (in general) Poor  Pain is worse with: walking, bending, sitting, inactivity, standing and some activites Pain improves with: medication Relief from Meds: 5  Mobility walk with assistance how many minutes can you walk? 5 ability to climb steps?  no do you drive?  no  Function not employed: date last employed .  Neuro/Psych weakness numbness trouble walking dizziness  Prior Studies new  Physicians involved in your care new   Family History  Problem Relation Age of Onset  . Hypertension Mother   . Other Father        CABGx4   Social History   Socioeconomic History  . Marital status: Married    Spouse name: Ed  . Number of children: Not on file  . Years of education: 19  . Highest education level: 12th grade  Occupational History  . Occupation: retired  Tobacco Use  . Smoking status: Former Smoker  Packs/day: 0.30    Types: Cigarettes    Quit date: 12/22/2016    Years since quitting: 3.2  . Smokeless tobacco: Never Used  . Tobacco comment: smokes 3 cigarettes per day, not interested in quitting  Substance and Sexual Activity  . Alcohol use: No  . Drug use: No  . Sexual activity: Not on file  Other Topics Concern  . Not on file  Social History Narrative   Patient is right-handed.   Social Determinants of Health   Financial Resource Strain:   . Difficulty of Paying Living Expenses:   Food Insecurity:   . Worried About Charity fundraiser in the Last Year:   . Arboriculturist in the Last Year:   Transportation Needs:   . Film/video editor (Medical):   Marland Kitchen Lack of  Transportation (Non-Medical):   Physical Activity:   . Days of Exercise per Week:   . Minutes of Exercise per Session:   Stress:   . Feeling of Stress :   Social Connections:   . Frequency of Communication with Friends and Family:   . Frequency of Social Gatherings with Friends and Family:   . Attends Religious Services:   . Active Member of Clubs or Organizations:   . Attends Archivist Meetings:   Marland Kitchen Marital Status:    Past Surgical History:  Procedure Laterality Date  . HEMORROIDECTOMY    . LOOP RECORDER INSERTION N/A 09/16/2018   Procedure: LOOP RECORDER INSERTION;  Surgeon: Thompson Grayer, MD;  Location: [facility];  Service: Cardiovascular;  Laterality: N/A;  . LUMBAR LAMINECTOMY/DECOMPRESSION MICRODISCECTOMY  02/04/2011   L2-L3 and L3-L4 with diskectomy and plating  . SHOULDER ARTHROSCOPY W/ ROTATOR CUFF REPAIR Left    Past Medical History:  Diagnosis Date  . Atrial fibrillation (HCC)    post-operative   . Hepatitis   . Hyperlipidemia   . Hypertension   . Premature atrial contractions    systomatic PAC's and documented SVT ( likley atrial tachycardia )  . Stroke (cerebrum) (HCC)    BP (!) 152/89   Pulse 93   Temp 97.7 F (36.5 C)   Ht 5\' 3"  (1.6 m)   Wt 118 lb (53.5 kg)   SpO2 98%   BMI 20.90 kg/m   Opioid Risk Score:   Fall Risk Score:  `1  Depression screen PHQ 2/9  Depression screen PHQ 2/9 03/30/2020  Decreased Interest 3  Down, Depressed, Hopeless 0  PHQ - 2 Score 3  Altered sleeping 3  Tired, decreased energy 3  Change in appetite 0  Feeling bad or failure about yourself  0  Trouble concentrating 0  Moving slowly or fidgety/restless 0  Suicidal thoughts 0  PHQ-9 Score 9    Review of Systems  Constitutional: Negative.   HENT: Negative.   Eyes: Positive for visual disturbance.  Respiratory: Negative.   Cardiovascular: Negative.   Gastrointestinal: Negative.   Endocrine: Negative.   Genitourinary: Negative.     Musculoskeletal: Positive for back pain and gait problem.  Skin: Negative.   Allergic/Immunologic: Negative.   Neurological: Positive for dizziness, weakness and numbness.  Psychiatric/Behavioral: The patient is nervous/anxious.   All other systems reviewed and are negative.      Objective:   Physical Exam Has cane- doesn't use it.    Awake, alert, appropriate, accompanied by husband, NAD Talkative- very informative Sitting up on table, appropriate, NAD MS: RLE- HF 4+/5, KE 4/5, KF 4-/5, DF 4-/5, PF 4+/5 LLE-  HF 4/5, KE 4-/5, KF 4-/5, DF 4-/5, PF 4/5  Neuro: Light touch intact B/L L1-S1 B/L DTRs- 2+ in R patella; absent L patella R achilles 1+; L achilles 1+ No clonus or hoffman's B/L Poor balance- just with standing- worse with feet together or eyes closed- cannot do Needed to pull up with arms to stand up Poor proprioception- can't keep balance/constant moving feet to keep balance. Cannot tell where toes/feet are In space B/L 0/3 B/L    IMPRESSION:MRI of lumbar spine 11/20 1. Severe scoliosis and degenerative lumbar spondylosis with multilevel disc disease and facet disease. 2. Stable left lateral recess and foraminal stenosis at T12-L1 and L1-2. 3. Left lateral and interbody fusion hardware at L2-3 and L3-4 appears stable. No complicating features. Mild left lateral recess stenosis at both these levels is stable. No significant spinal or foraminal stenosis. 4. New broad-based disc protrusion with central focality L4-5 along with diffuse bulging degenerated annulus, severe facet disease and ligamentum flavum thickening all contributing to moderately severe spinal and bilateral lateral recess stenosis. There is also a broad-based left foraminal and extraforaminal disc protrusion at L4-5 with left foraminal stenosis likely effecting the left L4 nerve root. There is also mild right foraminal stenosis. 5. Severe degenerate disc disease at L5-S1 with mild spinal  and bilateral lateral recess stenosis and significant right foraminal stenosis.     Assessment & Plan:   Patient is a 74 yr old female with hx of "2 strokes, prevention of additional strokes, on Eliquis, Afib, sees cardiology, HTN, has severe scoliosis- here for RLE pain evaluation.  Poor proprioception -poor balance   1. Lyrica- 25 mg nightly x 3 days then 25 mg 2x/day x 3 days then 3x/day-finish 2 week supply.   2. The increase Lyrica to 50 mg 3x/day- for nerve pain.   3. Don't take pain meds unless has pain.   4. My hope is to reduce oxycodone/tylenol medicine.   5. Pain doesn't improve on lyrica until on 50 mg 3x/day for 4-7 days- will gradually improve- call me ~ 1 month if things aren't better.   6. F/U in 6 weeks.   7. Use cane more.   I spent a total of 1 hour on appointment- more than 30 minutes on nerve pain and balance discussion

## 2020-03-30 NOTE — Progress Notes (Signed)
ILR Remote 

## 2020-03-31 ENCOUNTER — Other Ambulatory Visit: Payer: Self-pay | Admitting: Student

## 2020-03-31 DIAGNOSIS — I480 Paroxysmal atrial fibrillation: Secondary | ICD-10-CM

## 2020-04-01 DIAGNOSIS — I129 Hypertensive chronic kidney disease with stage 1 through stage 4 chronic kidney disease, or unspecified chronic kidney disease: Secondary | ICD-10-CM | POA: Diagnosis not present

## 2020-04-01 DIAGNOSIS — N182 Chronic kidney disease, stage 2 (mild): Secondary | ICD-10-CM | POA: Diagnosis not present

## 2020-04-01 DIAGNOSIS — K754 Autoimmune hepatitis: Secondary | ICD-10-CM | POA: Diagnosis not present

## 2020-04-01 DIAGNOSIS — K745 Biliary cirrhosis, unspecified: Secondary | ICD-10-CM | POA: Diagnosis not present

## 2020-04-12 ENCOUNTER — Other Ambulatory Visit: Payer: Self-pay | Admitting: Student

## 2020-04-12 DIAGNOSIS — I480 Paroxysmal atrial fibrillation: Secondary | ICD-10-CM

## 2020-04-13 ENCOUNTER — Other Ambulatory Visit: Payer: Self-pay | Admitting: Student

## 2020-04-13 DIAGNOSIS — I480 Paroxysmal atrial fibrillation: Secondary | ICD-10-CM

## 2020-04-13 MED ORDER — DILTIAZEM HCL ER COATED BEADS 180 MG PO CP24: 180.0000 mg | ORAL_CAPSULE | Freq: Every day | ORAL | 1 refills | Status: DC

## 2020-04-14 DIAGNOSIS — H40013 Open angle with borderline findings, low risk, bilateral: Secondary | ICD-10-CM | POA: Diagnosis not present

## 2020-04-14 DIAGNOSIS — Z961 Presence of intraocular lens: Secondary | ICD-10-CM | POA: Diagnosis not present

## 2020-04-14 DIAGNOSIS — H0102B Squamous blepharitis left eye, upper and lower eyelids: Secondary | ICD-10-CM | POA: Diagnosis not present

## 2020-04-14 DIAGNOSIS — H0102A Squamous blepharitis right eye, upper and lower eyelids: Secondary | ICD-10-CM | POA: Diagnosis not present

## 2020-04-18 DIAGNOSIS — K754 Autoimmune hepatitis: Secondary | ICD-10-CM | POA: Diagnosis not present

## 2020-04-21 DIAGNOSIS — L821 Other seborrheic keratosis: Secondary | ICD-10-CM | POA: Diagnosis not present

## 2020-04-21 DIAGNOSIS — L578 Other skin changes due to chronic exposure to nonionizing radiation: Secondary | ICD-10-CM | POA: Diagnosis not present

## 2020-04-21 DIAGNOSIS — C44729 Squamous cell carcinoma of skin of left lower limb, including hip: Secondary | ICD-10-CM | POA: Diagnosis not present

## 2020-04-27 ENCOUNTER — Ambulatory Visit (INDEPENDENT_AMBULATORY_CARE_PROVIDER_SITE_OTHER): Payer: Medicare Other | Admitting: *Deleted

## 2020-04-27 DIAGNOSIS — I480 Paroxysmal atrial fibrillation: Secondary | ICD-10-CM | POA: Diagnosis not present

## 2020-04-27 LAB — CUP PACEART REMOTE DEVICE CHECK
Date Time Interrogation Session: 20210526015329
Implantable Pulse Generator Implant Date: 20191015

## 2020-04-28 NOTE — Progress Notes (Signed)
Carelink Summary Report / Loop Recorder 

## 2020-05-02 DIAGNOSIS — I129 Hypertensive chronic kidney disease with stage 1 through stage 4 chronic kidney disease, or unspecified chronic kidney disease: Secondary | ICD-10-CM | POA: Diagnosis not present

## 2020-05-02 DIAGNOSIS — K745 Biliary cirrhosis, unspecified: Secondary | ICD-10-CM | POA: Diagnosis not present

## 2020-05-02 DIAGNOSIS — N182 Chronic kidney disease, stage 2 (mild): Secondary | ICD-10-CM | POA: Diagnosis not present

## 2020-05-02 DIAGNOSIS — K754 Autoimmune hepatitis: Secondary | ICD-10-CM | POA: Diagnosis not present

## 2020-05-09 ENCOUNTER — Other Ambulatory Visit: Payer: Self-pay

## 2020-05-09 ENCOUNTER — Encounter: Payer: Self-pay | Admitting: Physical Medicine and Rehabilitation

## 2020-05-09 ENCOUNTER — Encounter
Payer: Medicare Other | Attending: Physical Medicine and Rehabilitation | Admitting: Physical Medicine and Rehabilitation

## 2020-05-09 VITALS — BP 122/77 | HR 88 | Temp 98.3°F | Ht 63.0 in | Wt 124.0 lb

## 2020-05-09 DIAGNOSIS — M5386 Other specified dorsopathies, lumbar region: Secondary | ICD-10-CM | POA: Diagnosis not present

## 2020-05-09 DIAGNOSIS — R269 Unspecified abnormalities of gait and mobility: Secondary | ICD-10-CM

## 2020-05-09 DIAGNOSIS — R29818 Other symptoms and signs involving the nervous system: Secondary | ICD-10-CM | POA: Insufficient documentation

## 2020-05-09 MED ORDER — PREGABALIN 100 MG PO CAPS: 100.0000 mg | ORAL_CAPSULE | Freq: Three times a day (TID) | ORAL | 5 refills | Status: DC

## 2020-05-09 NOTE — Patient Instructions (Signed)
1. Increase lyrica to 100 mg 2x/day x 3 days then 100 mg 3x/day- for nerve pain.   2. Continue to wean Oxycodone- as tolerated-   3. Magnesium over the counter- 200-400mg   1-2 pills/day if still taking Oxycodone. In vitamin section.   4. F/U in 8 weeks.

## 2020-05-09 NOTE — Progress Notes (Signed)
Subjective:    Patient ID: Abigail Sullivan, female    DOB: 08-20-1946, 74 y.o.   MRN: [id number]  HPI    Patient is a 74 yr old female with hx of "2 strokes, prevention of additional strokes, on Eliquis, Afib, sees cardiology, HTN, has severe scoliosis- here for RLE pain f/u. Marland Kitchen  Poor proprioception -poor balance   Still on Lyrica 50 mg 3x/day- is helping some.  No side effects from Lyrica- and helping a little bit with bowels.     Was taking 2 pills 2x/day prior and now on 2 pills 1x/day.  Of oxycodone.   Constipation slightly better   Pain Inventory Average Pain 3 Pain Right Now 0 My pain is dull  In the last 24 hours, has pain interfered with the following? General activity 4 Relation with others 2 Enjoyment of life 4 What TIME of day is your pain at its worst? daytime Sleep (in general) Fair  Pain is worse with: walking Pain improves with: medication Relief from Meds: 8  Mobility walk without assistance how many minutes can you walk? 5 ability to climb steps?  no do you drive?  no  Function retired I need assistance with the following:  household duties  Neuro/Psych weakness trouble walking dizziness  Prior Studies Any changes since last visit?  no  Physicians involved in your care Primary care [employer]   Family History  Problem Relation Age of Onset  . Hypertension Mother   . Other Father        CABGx4   Social History   Socioeconomic History  . Marital status: Married    Spouse name: Ed  . Number of children: Not on file  . Years of education: 23  . Highest education level: 12th grade  Occupational History  . Occupation: retired  Tobacco Use  . Smoking status: Former Smoker    Packs/day: 0.30    Types: Cigarettes    Quit date: 12/22/2016    Years since quitting: 3.3  . Smokeless tobacco: Never Used  . Tobacco comment: smokes 3 cigarettes per day, not interested in quitting  Substance and Sexual Activity  .  Alcohol use: No  . Drug use: No  . Sexual activity: Not on file  Other Topics Concern  . Not on file  Social History Narrative   Patient is right-handed.   Social Determinants of Health   Financial Resource Strain:   . Difficulty of Paying Living Expenses:   Food Insecurity:   . Worried About Charity fundraiser in the Last Year:   . Arboriculturist in the Last Year:   Transportation Needs:   . Film/video editor (Medical):   Marland Kitchen Lack of Transportation (Non-Medical):   Physical Activity:   . Days of Exercise per Week:   . Minutes of Exercise per Session:   Stress:   . Feeling of Stress :   Social Connections:   . Frequency of Communication with Friends and Family:   . Frequency of Social Gatherings with Friends and Family:   . Attends Religious Services:   . Active Member of Clubs or Organizations:   . Attends Archivist Meetings:   Marland Kitchen Marital Status:    Past Surgical History:  Procedure Laterality Date  . HEMORROIDECTOMY    . LOOP RECORDER INSERTION N/A 09/16/2018   Procedure: LOOP RECORDER INSERTION;  Surgeon: Thompson Grayer, MD;  Location: [facility];  Service: Cardiovascular;  Laterality: N/A;  .  LUMBAR LAMINECTOMY/DECOMPRESSION MICRODISCECTOMY  02/04/2011   L2-L3 and L3-L4 with diskectomy and plating  . SHOULDER ARTHROSCOPY W/ ROTATOR CUFF REPAIR Left    Past Medical History:  Diagnosis Date  . Atrial fibrillation (HCC)    post-operative   . Hepatitis   . Hyperlipidemia   . Hypertension   . Premature atrial contractions    systomatic PAC's and documented SVT ( likley atrial tachycardia )  . Stroke (cerebrum) (HCC)    BP 122/77   Pulse 88   Temp 98.3 F (36.8 C)   Ht 5\' 3"  (1.6 m)   Wt 124 lb (56.2 kg)   SpO2 96%   BMI 21.97 kg/m   Opioid Risk Score:   Fall Risk Score:  `1  Depression screen PHQ 2/9  Depression screen PHQ 2/9 03/30/2020  Decreased Interest 3  Down, Depressed, Hopeless 0  PHQ - 2 Score 3  Altered sleeping 3    Tired, decreased energy 3  Change in appetite 0  Feeling bad or failure about yourself  0  Trouble concentrating 0  Moving slowly or fidgety/restless 0  Suicidal thoughts 0  PHQ-9 Score 9    Review of Systems  Musculoskeletal: Positive for gait problem.  Neurological: Positive for dizziness and weakness.  All other systems reviewed and are negative.      Objective:   Physical Exam  Awake, alert, appropriate, accompanied by husband, NAD No change in MS exam Like nerve being squeezed in back and RLE      Assessment & Plan:   1. Increase lyrica to 100 mg 2x/day x 3 days then 100 mg 3x/day- for nerve pain.   2. Continue to wean Oxycodone- as tolerated-   3. Magnesium over the counter- 200-400mg   1-2 pills/day if still taking Oxycodone. In vitamin section.   4. F/U in 8 weeks.

## 2020-05-17 DIAGNOSIS — M4120 Other idiopathic scoliosis, site unspecified: Secondary | ICD-10-CM | POA: Diagnosis not present

## 2020-05-17 DIAGNOSIS — Z981 Arthrodesis status: Secondary | ICD-10-CM | POA: Diagnosis not present

## 2020-05-17 DIAGNOSIS — M48061 Spinal stenosis, lumbar region without neurogenic claudication: Secondary | ICD-10-CM | POA: Diagnosis not present

## 2020-05-20 ENCOUNTER — Telehealth: Payer: Self-pay

## 2020-05-20 NOTE — Telephone Encounter (Signed)
Abigail Sullivan c/o diarrhea, feeling disoriented, & feeling drunk after taking Rx Pregabalin. She reported only taking 2 capsules  daily.    She has stopped taking the medication.    Please advise.

## 2020-05-23 MED ORDER — PREGABALIN 50 MG PO CAPS: 50.0000 mg | ORAL_CAPSULE | Freq: Three times a day (TID) | ORAL | 5 refills | Status: DC

## 2020-05-23 NOTE — Telephone Encounter (Signed)
Had diarrhea and felt disoriented and drunk on Lyrica.  Had been dealing well on lower dose Lyrica but stopped Lyrica 2-3 days ago- completely.    Usually taking oxycodone 1x/day or every 2-3 days.  Go back to Lyrica 50 mg 3x/day- since was on a good somewhat relieving dose . Sent in Rx for 50 mg TID with 5 RFs  Will not change meds at this time- otherwise.  F/U 8/9 9:40 am

## 2020-05-30 ENCOUNTER — Ambulatory Visit (INDEPENDENT_AMBULATORY_CARE_PROVIDER_SITE_OTHER): Payer: Medicare Other | Admitting: *Deleted

## 2020-05-30 DIAGNOSIS — I480 Paroxysmal atrial fibrillation: Secondary | ICD-10-CM | POA: Diagnosis not present

## 2020-05-30 LAB — CUP PACEART REMOTE DEVICE CHECK
Date Time Interrogation Session: 20210628030533
Implantable Pulse Generator Implant Date: 20191015

## 2020-05-31 NOTE — Progress Notes (Signed)
Carelink Summary Report / Loop Recorder 

## 2020-06-20 DIAGNOSIS — K743 Primary biliary cirrhosis: Secondary | ICD-10-CM | POA: Diagnosis not present

## 2020-07-03 DIAGNOSIS — K754 Autoimmune hepatitis: Secondary | ICD-10-CM | POA: Diagnosis not present

## 2020-07-03 DIAGNOSIS — N182 Chronic kidney disease, stage 2 (mild): Secondary | ICD-10-CM | POA: Diagnosis not present

## 2020-07-03 DIAGNOSIS — K745 Biliary cirrhosis, unspecified: Secondary | ICD-10-CM | POA: Diagnosis not present

## 2020-07-03 DIAGNOSIS — I129 Hypertensive chronic kidney disease with stage 1 through stage 4 chronic kidney disease, or unspecified chronic kidney disease: Secondary | ICD-10-CM | POA: Diagnosis not present

## 2020-07-04 ENCOUNTER — Ambulatory Visit (INDEPENDENT_AMBULATORY_CARE_PROVIDER_SITE_OTHER): Payer: Medicare Other | Admitting: *Deleted

## 2020-07-04 DIAGNOSIS — I480 Paroxysmal atrial fibrillation: Secondary | ICD-10-CM

## 2020-07-04 LAB — CUP PACEART REMOTE DEVICE CHECK
Date Time Interrogation Session: 20210731031018
Implantable Pulse Generator Implant Date: 20191015

## 2020-07-06 NOTE — Progress Notes (Signed)
Carelink Summary Report / Loop Recorder 

## 2020-07-11 ENCOUNTER — Other Ambulatory Visit: Payer: Self-pay

## 2020-07-11 ENCOUNTER — Encounter: Payer: Self-pay | Admitting: Physical Medicine and Rehabilitation

## 2020-07-11 ENCOUNTER — Encounter
Payer: Medicare Other | Attending: Physical Medicine and Rehabilitation | Admitting: Physical Medicine and Rehabilitation

## 2020-07-11 VITALS — BP 135/80 | HR 102 | Temp 98.9°F | Ht 63.0 in | Wt 118.0 lb

## 2020-07-11 DIAGNOSIS — R269 Unspecified abnormalities of gait and mobility: Secondary | ICD-10-CM | POA: Diagnosis not present

## 2020-07-11 DIAGNOSIS — R29818 Other symptoms and signs involving the nervous system: Secondary | ICD-10-CM | POA: Diagnosis not present

## 2020-07-11 DIAGNOSIS — M5386 Other specified dorsopathies, lumbar region: Secondary | ICD-10-CM | POA: Diagnosis not present

## 2020-07-11 NOTE — Patient Instructions (Signed)
Patient is a 74 yr old female with hx of "2 strokes, prevention of additional strokes, on Eliquis, Afib, sees cardiology, HTN, has severe scoliosis- here for RLE pain f/u. Marland Kitchen  Poor proprioception -poor balance   1. Reduce Lyrica to 50 mg 2-3x/day.   2. RLE pain has resolved.   3. LLE- nerve pain- Lyrica 50 mg 2x/day- x 3 days- with breakfast and with dinner- 7am and 4pm-  And if needs to add the third dose, add to breakfast. So 100 mg in AM and 50 mg at night.  Start the change on Friday this week.    4. If it works, stay on 50 mg; if helps but not enough, then can increase back to 100 mg 2x/day as of next Monday. Don't go up to 100 mg 3x/day, please.    5. Lunesta isn't on her formulary, so cannot prescribe.   6. Call sleep specialist-  If needs addition to  sleep medicine. \  7. Doesn't need new prescription because has enough pills right now. Might try steroids if doesn't improve.    8. F/U in 1 month

## 2020-07-11 NOTE — Progress Notes (Signed)
Subjective:    Patient ID: Abigail Sullivan, female    DOB: [birth year], 74 y.o.   MRN: [id number]  HPI  Patient is a 74 yr old female with hx of "2 strokes, prevention of additional strokes, on Eliquis, Afib, sees cardiology, HTN, has severe scoliosis- here for RLE pain f/u. Marland Kitchen  Poor proprioception -poor balance   Has a Pyranese knocked her down 7/4- to the left- head hit concrete-  Had rug burn on Forehead and L cheek Pain from L ear to back of head and wrenched neck as well- still bothers her. Also had a black eye.  Didn't go to hospital or doctor-  Has fallen many times- always hit her head.    Hasn't taken opiates in days.  R leg hasn't bothered her in months now.   Now- 2-3 weeks ago- L quad to top of L ankle- quad hurts so bad, hurts to stand/walk- it's excruciating   Don't believe it's nerve pain- feels muscular.   Stopped Lyrica a long time ago- didn't work for her, after increased at last visit- but when 100 mg too much, didn't take any more Lyrica.   Took some Ibuprofen (which isn't supposed to take due to being on Eliquis).  Took Lyrica 100 mg BID for last few days- not 3x/day- thought was was too strong.  1 dose of Lyrica Saturday and 2 doses on Sunday.  Hasn't slept in 2 days- since restarted Lyrica- not sure if they are connected.   Lorazepam and 1 Basalmra at night.  Feels like she's a basket case- and thrash in bed.   Takes 2nd Lyrica at ~ 7pm, so doesn't interfere  Feels the thrashing makes it impossible to fall asleep. Feels like can't NOT move- can't stay still.    Not sleepy at all either.  No other new meds, steroids.     For sleep: Tried Quetiapine- didn't work Trazodone- didn't work Ambien- afraid due to sleep walking risk Doesn't think tried Johnnye Sima Tried melatonin/Benadryl   Pain Inventory Average Pain 10 Pain Right Now 0 My pain is stabbing  In the last 24 hours, has pain interfered with the following? General activity 4 Relation  with others 8 Enjoyment of life 8 What TIME of day is your pain at its worst? all Sleep (in general) Poor  Pain is worse with: walking Pain improves with: rest and medication Relief from Meds: 7  Mobility use a cane how many minutes can you walk? 10-15 ability to climb steps?  yes do you drive?  no  Function retired I need assistance with the following:  meal prep, household duties and shopping  Neuro/Psych trouble walking  Prior Studies Any changes since last visit?  no  Physicians involved in your care Any changes since last visit?  no   Family History  Problem Relation Age of Onset   Hypertension Mother    Other Father        CABGx4   Social History   Socioeconomic History   Marital status: Married    Spouse name: Ed   Number of children: Not on file   Years of education: 12   Highest education level: 12th grade  Occupational History   Occupation: retired  Tobacco Use   Smoking status: Former Smoker    Packs/day: 0.30    Types: Cigarettes    Quit date: 12/22/2016    Years since quitting: 3.5   Smokeless tobacco: Never Used   Tobacco comment: smokes 3 cigarettes per day,  not interested in quitting  Vaping Use   Vaping Use: Never used  Substance and Sexual Activity   Alcohol use: No   Drug use: No   Sexual activity: Not on file  Other Topics Concern   Not on file  Social History Narrative   Patient is right-handed.   Social Determinants of Health   Financial Resource Strain:    Difficulty of Paying Living Expenses:   Food Insecurity:    Worried About Charity fundraiser in the Last Year:    Arboriculturist in the Last Year:   Transportation Needs:    Film/video editor (Medical):    Lack of Transportation (Non-Medical):   Physical Activity:    Days of Exercise per Week:    Minutes of Exercise per Session:   Stress:    Feeling of Stress :   Social Connections:    Frequency of Communication with Friends and  Family:    Frequency of Social Gatherings with Friends and Family:    Attends Religious Services:    Active Member of Clubs or Organizations:    Attends Archivist Meetings:    Marital Status:    Past Surgical History:  Procedure Laterality Date   HEMORROIDECTOMY     LOOP RECORDER INSERTION N/A 09/16/2018   Procedure: LOOP RECORDER INSERTION;  Surgeon: Thompson Grayer, MD;  Location: [facility];  Service: Cardiovascular;  Laterality: N/A;   LUMBAR LAMINECTOMY/DECOMPRESSION MICRODISCECTOMY  02/04/2011   L2-L3 and L3-L4 with diskectomy and plating   SHOULDER ARTHROSCOPY W/ ROTATOR CUFF REPAIR Left    Past Medical History:  Diagnosis Date   Atrial fibrillation (HCC)    post-operative    Hepatitis    Hyperlipidemia    Hypertension    Premature atrial contractions    systomatic PAC's and documented SVT ( likley atrial tachycardia )   Stroke (cerebrum) (HCC)    BP 135/80    Pulse (!) 102    Temp 98.9 F (37.2 C)    Ht 5\' 3"  (1.6 m)    Wt 118 lb (53.5 kg)    SpO2 98%    BMI 20.90 kg/m   Opioid Risk Score:   Fall Risk Score:  `1  Depression screen PHQ 2/9  Depression screen [facility] 2/9 07/11/2020 03/30/2020  Decreased Interest 0 3  Down, Depressed, Hopeless 0 0  PHQ - 2 Score 0 3  Altered sleeping - 3  Tired, decreased energy - 3  Change in appetite - 0  Feeling bad or failure about yourself  - 0  Trouble concentrating - 0  Moving slowly or fidgety/restless - 0  Suicidal thoughts - 0  PHQ-9 Score - 9   Review of Systems  Musculoskeletal: Positive for gait problem.       Objective:   Physical Exam  Awake, alert, appropriate, accompanied by husband, pressure speech, a little anxious, NAD NOT TTP over L quad- "is deep"- don't think muscular because not palpable.      Assessment & Plan:   Patient is a 74 yr old female with hx of "2 strokes, prevention of additional strokes, on Eliquis, Afib, sees cardiology, HTN, has severe scoliosis- here for  RLE pain f/u. Marland Kitchen  Poor proprioception -poor balance   1. Reduce Lyrica to 50 mg 2-3x/day.   2. RLE pain has resolved.   3. LLE- nerve pain- Lyrica 50 mg 2x/day- x 3 days- with breakfast and with dinner- 7am and 4pm-  And if needs to  add the third dose, add to breakfast. So 100 mg in AM and 50 mg at night.  Start the change on Friday this week.    4. If it works, stay on 50 mg; if helps but not enough, then can increase back to 100 mg 2x/day as of next Monday. Don't go up to 100 mg 3x/day, please.    5. Lunesta isn't on her formulary, so cannot prescribe.   6. Call sleep specialist-  If needs addition to  sleep medicine. \  7. Doesn't need new prescription because has enough pills right now. Might try steroids if doesn't improve.   8. F/U in 1 month  I spent a total of 35 minutes on visit- as detailed above.

## 2020-07-13 ENCOUNTER — Telehealth: Payer: Self-pay | Admitting: *Deleted

## 2020-07-13 NOTE — Telephone Encounter (Signed)
Abigail Sullivan called to speak to someone about her pain.  She reports that the pain medication only helps for a very short time and then the remainder of the time she is in excruciating pain. She wants to talk about what she is supposed to do in the interim time so that she can function.  Her # is [phone]

## 2020-07-14 ENCOUNTER — Telehealth: Payer: Self-pay

## 2020-07-14 MED ORDER — PREDNISONE 10 MG PO TABS: ORAL_TABLET | ORAL | 0 refills | Status: DC

## 2020-07-14 NOTE — Telephone Encounter (Signed)
Mrs. Scheffler called to report that Lyrica is not helping her pain at all. She has tried the 50 mg & 100 mg doses. No pain relief. I am in a lot of pain. Please advise.

## 2020-07-14 NOTE — Telephone Encounter (Signed)
Called pt- we dicussed options- will send in a prednisone taper for irritation of the nerve in LLE- if this doesn't work, might need to get another MRI-   Explained will take 1-2 days before starts feeling better- call me if it doesn't.   40 mg x 2 days then 20 mg x 2 days then 10 mg x 2 days then 5 mg x 2 days, then stop.

## 2020-07-15 ENCOUNTER — Telehealth: Payer: Self-pay

## 2020-07-15 NOTE — Telephone Encounter (Signed)
Patient called stating Prednisone worked for her pain in leg last night but this morning not working. Please advise.

## 2020-07-15 NOTE — Telephone Encounter (Signed)
I explained very clearly to her, it takes 1-2 DAYS to kick in- she hasn't even gone 24 hours as of yet, so please give it time to work- I don't have a miracle pill, which I also explained- I appreciate your help, ML

## 2020-07-19 NOTE — Telephone Encounter (Signed)
Called patient back and she states in her opinion the Prednisone didn't help but she believes it was the Percocet that she takes for her back pain helped with her leg pain. She did continue the Prednisone though and will be done tomorrow.

## 2020-07-19 NOTE — Telephone Encounter (Signed)
OK- good to know- which is weird, because initially she told me that oxycodone did nothing at all- so I think prednisone did more than she thinks- but that's fine.

## 2020-08-01 DIAGNOSIS — G4700 Insomnia, unspecified: Secondary | ICD-10-CM | POA: Diagnosis not present

## 2020-08-02 DIAGNOSIS — K754 Autoimmune hepatitis: Secondary | ICD-10-CM | POA: Diagnosis not present

## 2020-08-02 DIAGNOSIS — I129 Hypertensive chronic kidney disease with stage 1 through stage 4 chronic kidney disease, or unspecified chronic kidney disease: Secondary | ICD-10-CM | POA: Diagnosis not present

## 2020-08-02 DIAGNOSIS — N182 Chronic kidney disease, stage 2 (mild): Secondary | ICD-10-CM | POA: Diagnosis not present

## 2020-08-02 DIAGNOSIS — K745 Biliary cirrhosis, unspecified: Secondary | ICD-10-CM | POA: Diagnosis not present

## 2020-08-07 LAB — CUP PACEART REMOTE DEVICE CHECK
Date Time Interrogation Session: 20210902031227
Implantable Pulse Generator Implant Date: 20191015

## 2020-08-09 ENCOUNTER — Ambulatory Visit (INDEPENDENT_AMBULATORY_CARE_PROVIDER_SITE_OTHER): Payer: Medicare Other | Admitting: *Deleted

## 2020-08-09 DIAGNOSIS — I480 Paroxysmal atrial fibrillation: Secondary | ICD-10-CM

## 2020-08-10 ENCOUNTER — Other Ambulatory Visit: Payer: Self-pay | Admitting: Internal Medicine

## 2020-08-10 NOTE — Progress Notes (Signed)
Carelink Summary Report / Loop Recorder 

## 2020-08-10 NOTE — Telephone Encounter (Signed)
Prescription refill request for Eliquis received.  Last office visit: [facility], 09/28/2019 Scr: 0.95, 04/18/2020 Age: 74 yo Weight: 53.5 kg   Prescription refill sent.

## 2020-08-12 ENCOUNTER — Encounter: Payer: Self-pay | Admitting: Physical Medicine and Rehabilitation

## 2020-08-12 ENCOUNTER — Other Ambulatory Visit: Payer: Self-pay

## 2020-08-12 ENCOUNTER — Encounter
Payer: Medicare Other | Attending: Physical Medicine and Rehabilitation | Admitting: Physical Medicine and Rehabilitation

## 2020-08-12 VITALS — BP 115/74 | HR 84 | Temp 98.3°F | Ht 63.0 in | Wt 120.2 lb

## 2020-08-12 DIAGNOSIS — M5386 Other specified dorsopathies, lumbar region: Secondary | ICD-10-CM | POA: Diagnosis not present

## 2020-08-12 DIAGNOSIS — R29818 Other symptoms and signs involving the nervous system: Secondary | ICD-10-CM | POA: Insufficient documentation

## 2020-08-12 DIAGNOSIS — R269 Unspecified abnormalities of gait and mobility: Secondary | ICD-10-CM | POA: Diagnosis not present

## 2020-08-12 DIAGNOSIS — M792 Neuralgia and neuritis, unspecified: Secondary | ICD-10-CM | POA: Diagnosis not present

## 2020-08-12 MED ORDER — DULOXETINE HCL 30 MG PO CPEP: 30.0000 mg | ORAL_CAPSULE | Freq: Every day | ORAL | 5 refills | Status: DC

## 2020-08-12 NOTE — Patient Instructions (Signed)
  1. Will start Cymbalta/Duloxetine 30 mg daily for nerve pain and back pain.  Then after 1 week, go to 60 mg daily- (2 capsules) -  1% can get nausea- if you get nausea, call me for anti-nausea medicine- will last at most 7 days.  If you sleepy with it, take it at night. Take every day, no matter what- with breakfast or crackers.     2. IF, CYMBALTA/DULOXETINE WORKS,  On week 4- wean Lyrica to 100 mg daily, x 1 week, then stop Lyrica, if tolerated   3. Can still take Oxycodone as needed, if appropriate. Don't have to stop it.    4.  F/U in 5-6 weeks to see how things are going

## 2020-08-12 NOTE — Progress Notes (Signed)
Subjective:    Patient ID: Abigail Sullivan, female    DOB: 06-Nov-1946, 74 y.o.   MRN: [id number]  HPI  Patient is a 74 yr old female with hx of "2 strokes, prevention of additional strokes, on Eliquis, Afib, sees cardiology, HTN, has severe scoliosis- here for RLE painf/u.Marland Kitchen  Poor proprioception -poor balance   So when gets out of bed in AM, L quad acts up- takes the oxycodone- 2 tabs of Oxycodone- because 1 tab doesn't enough-  and then in afternoon, 2 more tabs of Oxycodone-  Total 2 tabs BID -  rx from Dr Vertell Limber.  Writes for 120 tabs/month  RLE- gets pain down lateral aspect of RLE-  - If doesn't take Oxycodone.   Takes Lyrica 100 mg in AM and 50 mg in afternoon-  Was blaming the Left leg for hurting DUE to Lyrica.   Also having L groin pain and quad pain.           Pain Inventory Average Pain 10 Pain Right Now 0 becasue she has taken her pain med My pain is constant, sharp, burning, dull, stabbing, tingling and aching  In the last 24 hours, has pain interfered with the following? General activity 0 Relation with others 0 Enjoyment of life 0 What TIME of day is your pain at its worst? morning  and night Sleep (in general) Good  Pain is worse with: walking Pain improves with: rest and medication Relief from Meds: 10  Family History  Problem Relation Age of Onset  . Hypertension Mother   . Other Father        CABGx4   Social History   Socioeconomic History  . Marital status: Married    Spouse name: Ed  . Number of children: Not on file  . Years of education: 44  . Highest education level: 12th grade  Occupational History  . Occupation: retired  Tobacco Use  . Smoking status: Former Smoker    Packs/day: 0.30    Types: Cigarettes    Quit date: 12/22/2016    Years since quitting: 3.6  . Smokeless tobacco: Never Used  . Tobacco comment: smokes 3 cigarettes per day, not interested in quitting  Vaping Use  . Vaping Use: Never used  Substance and Sexual  Activity  . Alcohol use: No  . Drug use: No  . Sexual activity: Not on file  Other Topics Concern  . Not on file  Social History Narrative   Patient is right-handed.   Social Determinants of Health   Financial Resource Strain:   . Difficulty of Paying Living Expenses: Not on file  Food Insecurity:   . Worried About Charity fundraiser in the Last Year: Not on file  . Ran Out of Food in the Last Year: Not on file  Transportation Needs:   . Lack of Transportation (Medical): Not on file  . Lack of Transportation (Non-Medical): Not on file  Physical Activity:   . Days of Exercise per Week: Not on file  . Minutes of Exercise per Session: Not on file  Stress:   . Feeling of Stress : Not on file  Social Connections:   . Frequency of Communication with Friends and Family: Not on file  . Frequency of Social Gatherings with Friends and Family: Not on file  . Attends Religious Services: Not on file  . Active Member of Clubs or Organizations: Not on file  . Attends Archivist Meetings: Not on file  . Marital  Status: Not on file   Past Surgical History:  Procedure Laterality Date  . HEMORROIDECTOMY    . LOOP RECORDER INSERTION N/A 09/16/2018   Procedure: LOOP RECORDER INSERTION;  Surgeon: Thompson Grayer, MD;  Location: [facility];  Service: Cardiovascular;  Laterality: N/A;  . LUMBAR LAMINECTOMY/DECOMPRESSION MICRODISCECTOMY  02/04/2011   L2-L3 and L3-L4 with diskectomy and plating  . SHOULDER ARTHROSCOPY W/ ROTATOR CUFF REPAIR Left    Past Surgical History:  Procedure Laterality Date  . HEMORROIDECTOMY    . LOOP RECORDER INSERTION N/A 09/16/2018   Procedure: LOOP RECORDER INSERTION;  Surgeon: Thompson Grayer, MD;  Location: [facility];  Service: Cardiovascular;  Laterality: N/A;  . LUMBAR LAMINECTOMY/DECOMPRESSION MICRODISCECTOMY  02/04/2011   L2-L3 and L3-L4 with diskectomy and plating  . SHOULDER ARTHROSCOPY W/ ROTATOR CUFF REPAIR Left    Past Medical  History:  Diagnosis Date  . Atrial fibrillation (HCC)    post-operative   . Hepatitis   . Hyperlipidemia   . Hypertension   . Premature atrial contractions    systomatic PAC's and documented SVT ( likley atrial tachycardia )  . Stroke (cerebrum) (HCC)    BP 115/74   Pulse 84   Temp 98.3 F (36.8 C)   Ht 5\' 3"  (1.6 m)   Wt 120 lb 3.2 oz (54.5 kg)   SpO2 96%   BMI 21.29 kg/m   Opioid Risk Score:   Fall Risk Score:  `1  Depression screen PHQ 2/9  Depression screen [facility] 2/9 07/11/2020 03/30/2020  Decreased Interest 0 3  Down, Depressed, Hopeless 0 0  PHQ - 2 Score 0 3  Altered sleeping - 3  Tired, decreased energy - 3  Change in appetite - 0  Feeling bad or failure about yourself  - 0  Trouble concentrating - 0  Moving slowly or fidgety/restless - 0  Suicidal thoughts - 0  PHQ-9 Score - 9     Review of Systems  Constitutional: Negative.   HENT: Negative.   Eyes: Negative.   Respiratory: Negative.   Cardiovascular: Negative.   Gastrointestinal: Positive for constipation.  Endocrine: Negative.   Genitourinary: Negative.   Musculoskeletal: Positive for back pain.       Radiates down right leg, and has pain in left quad  Skin: Negative.   Allergic/Immunologic: Negative.   Neurological: Negative.   Hematological:       Blood thinner  Psychiatric/Behavioral: Negative.   All other systems reviewed and are negative.      Objective:   Physical Exam  Awake, alert, a little confused on complex medication regimens, NAD Not TTP on L quad-  But is painful for pt.  Is mildly TTP over lateral R thigh and R low back.       Assessment & Plan:   1. Will start Cymbalta/Duloxetine 30 mg daily for nerve pain and back pain.  Then after 1 week, go to 60 mg daily- (2 capsules) -  1% can get nausea- if you get nausea, call me for anti-nausea medicine- will last at most 7 days.  If you sleepy with it, take it at night. Take every day, no matter what- with breakfast or  crackers.     2. IF, CYMBALTA/DULOXETINE WORKS,  On week 4- wean Lyrica to 100 mg daily, x 1 week, then stop Lyrica, if tolerated   3. Can still take Oxycodone as needed, if appropriate. Don't have to stop it.    4.  F/U in 5-6 weeks to  see how things are going   I spent a total of 30 minutes on visit- as detailed above- going over the plan in different ways.

## 2020-08-16 DIAGNOSIS — M5416 Radiculopathy, lumbar region: Secondary | ICD-10-CM | POA: Diagnosis not present

## 2020-08-18 DIAGNOSIS — Z1331 Encounter for screening for depression: Secondary | ICD-10-CM | POA: Diagnosis not present

## 2020-08-18 DIAGNOSIS — E782 Mixed hyperlipidemia: Secondary | ICD-10-CM | POA: Diagnosis not present

## 2020-08-18 DIAGNOSIS — Z0000 Encounter for general adult medical examination without abnormal findings: Secondary | ICD-10-CM | POA: Diagnosis not present

## 2020-08-18 DIAGNOSIS — Z139 Encounter for screening, unspecified: Secondary | ICD-10-CM | POA: Diagnosis not present

## 2020-08-18 DIAGNOSIS — Z1339 Encounter for screening examination for other mental health and behavioral disorders: Secondary | ICD-10-CM | POA: Diagnosis not present

## 2020-08-26 DIAGNOSIS — I700 Atherosclerosis of aorta: Secondary | ICD-10-CM | POA: Diagnosis not present

## 2020-08-26 DIAGNOSIS — Z23 Encounter for immunization: Secondary | ICD-10-CM | POA: Diagnosis not present

## 2020-08-26 DIAGNOSIS — I4892 Unspecified atrial flutter: Secondary | ICD-10-CM | POA: Diagnosis not present

## 2020-08-26 DIAGNOSIS — K745 Biliary cirrhosis, unspecified: Secondary | ICD-10-CM | POA: Diagnosis not present

## 2020-08-26 DIAGNOSIS — K754 Autoimmune hepatitis: Secondary | ICD-10-CM | POA: Diagnosis not present

## 2020-09-02 DIAGNOSIS — M25551 Pain in right hip: Secondary | ICD-10-CM | POA: Diagnosis not present

## 2020-09-02 DIAGNOSIS — I700 Atherosclerosis of aorta: Secondary | ICD-10-CM | POA: Diagnosis not present

## 2020-09-02 DIAGNOSIS — I129 Hypertensive chronic kidney disease with stage 1 through stage 4 chronic kidney disease, or unspecified chronic kidney disease: Secondary | ICD-10-CM | POA: Diagnosis not present

## 2020-09-02 DIAGNOSIS — K745 Biliary cirrhosis, unspecified: Secondary | ICD-10-CM | POA: Diagnosis not present

## 2020-09-02 DIAGNOSIS — K754 Autoimmune hepatitis: Secondary | ICD-10-CM | POA: Diagnosis not present

## 2020-09-02 DIAGNOSIS — Z96649 Presence of unspecified artificial hip joint: Secondary | ICD-10-CM | POA: Diagnosis not present

## 2020-09-08 LAB — CUP PACEART REMOTE DEVICE CHECK
Date Time Interrogation Session: 20211005031503
Implantable Pulse Generator Implant Date: 20191015

## 2020-09-12 ENCOUNTER — Ambulatory Visit (INDEPENDENT_AMBULATORY_CARE_PROVIDER_SITE_OTHER): Payer: Medicare Other

## 2020-09-12 DIAGNOSIS — I480 Paroxysmal atrial fibrillation: Secondary | ICD-10-CM

## 2020-09-14 NOTE — Progress Notes (Signed)
Carelink Summary Report / Loop Recorder 

## 2020-09-26 ENCOUNTER — Encounter
Payer: Medicare Other | Attending: Physical Medicine and Rehabilitation | Admitting: Physical Medicine and Rehabilitation

## 2020-09-26 ENCOUNTER — Other Ambulatory Visit: Payer: Self-pay

## 2020-09-26 ENCOUNTER — Encounter: Payer: Self-pay | Admitting: Physical Medicine and Rehabilitation

## 2020-09-26 VITALS — BP 128/76 | HR 98 | Temp 98.6°F | Ht 63.0 in | Wt 115.8 lb

## 2020-09-26 DIAGNOSIS — R269 Unspecified abnormalities of gait and mobility: Secondary | ICD-10-CM | POA: Diagnosis not present

## 2020-09-26 DIAGNOSIS — M5386 Other specified dorsopathies, lumbar region: Secondary | ICD-10-CM | POA: Diagnosis not present

## 2020-09-26 DIAGNOSIS — Z23 Encounter for immunization: Secondary | ICD-10-CM | POA: Diagnosis not present

## 2020-09-26 DIAGNOSIS — M792 Neuralgia and neuritis, unspecified: Secondary | ICD-10-CM | POA: Diagnosis not present

## 2020-09-26 DIAGNOSIS — R29818 Other symptoms and signs involving the nervous system: Secondary | ICD-10-CM | POA: Insufficient documentation

## 2020-09-26 MED ORDER — LEVETIRACETAM 500 MG PO TABS: 500.0000 mg | ORAL_TABLET | Freq: Two times a day (BID) | ORAL | 5 refills | Status: DC

## 2020-09-26 NOTE — Progress Notes (Signed)
Subjective:    Patient ID: Abigail Sullivan, female    DOB: 14-Dec-1945, 74 y.o.   MRN: [id number]  HPI Patient is a 74 yr old female with hx of "2 strokes, prevention of additional strokes, on Eliquis, Afib, sees cardiology, HTN, has severe scoliosis- here for RLE painf/u.Marland Kitchen  Poor proprioception -poor balance  Things are the same.   Went to see Dr Ian Malkin because of pains in L quad. Thinks it's the nerve from her back.   Duloxetine hasn't helped at all- not even 5% difference- no side effects however.   Her nerve pain is getting worse lately- so nothing is helping.   Feels like losing her mind.  Feels like waist is hurting so much  Couldn't walk when gets out of bed- took Ibuprofen 800 mg (on blood thinners, so not supposed to).  And worked well.  Has already taken Oxycodone.     Pain Inventory Average Pain 5 Pain Right Now 0 My pain is intermittent and sharp  In the last 24 hours, has pain interfered with the following? General activity 1 Relation with others 0 Enjoyment of life 1 What TIME of day is your pain at its worst? morning  Sleep (in general) Good with medicine.  Pain is worse with: walking Pain improves with: rest and medication Relief from Meds: 1  Family History  Problem Relation Age of Onset  . Hypertension Mother   . Other Father        CABGx4   Social History   Socioeconomic History  . Marital status: Married    Spouse name: Ed  . Number of children: Not on file  . Years of education: 15  . Highest education level: 12th grade  Occupational History  . Occupation: retired  Tobacco Use  . Smoking status: Former Smoker    Packs/day: 0.30    Types: Cigarettes    Quit date: 12/22/2016    Years since quitting: 3.7  . Smokeless tobacco: Never Used  . Tobacco comment: smokes 3 cigarettes per day, not interested in quitting  Vaping Use  . Vaping Use: Never used  Substance and Sexual Activity  . Alcohol use: No  . Drug use: No  . Sexual activity:  Not on file  Other Topics Concern  . Not on file  Social History Narrative   Patient is right-handed.   Social Determinants of Health   Financial Resource Strain:   . Difficulty of Paying Living Expenses: Not on file  Food Insecurity:   . Worried About Charity fundraiser in the Last Year: Not on file  . Ran Out of Food in the Last Year: Not on file  Transportation Needs:   . Lack of Transportation (Medical): Not on file  . Lack of Transportation (Non-Medical): Not on file  Physical Activity:   . Days of Exercise per Week: Not on file  . Minutes of Exercise per Session: Not on file  Stress:   . Feeling of Stress : Not on file  Social Connections:   . Frequency of Communication with Friends and Family: Not on file  . Frequency of Social Gatherings with Friends and Family: Not on file  . Attends Religious Services: Not on file  . Active Member of Clubs or Organizations: Not on file  . Attends Archivist Meetings: Not on file  . Marital Status: Not on file   Past Surgical History:  Procedure Laterality Date  . HEMORROIDECTOMY    . LOOP RECORDER INSERTION N/A  09/16/2018   Procedure: LOOP RECORDER INSERTION;  Surgeon: Thompson Grayer, MD;  Location: [facility];  Service: Cardiovascular;  Laterality: N/A;  . LUMBAR LAMINECTOMY/DECOMPRESSION MICRODISCECTOMY  02/04/2011   L2-L3 and L3-L4 with diskectomy and plating  . SHOULDER ARTHROSCOPY W/ ROTATOR CUFF REPAIR Left    Past Surgical History:  Procedure Laterality Date  . HEMORROIDECTOMY    . LOOP RECORDER INSERTION N/A 09/16/2018   Procedure: LOOP RECORDER INSERTION;  Surgeon: Thompson Grayer, MD;  Location: [facility];  Service: Cardiovascular;  Laterality: N/A;  . LUMBAR LAMINECTOMY/DECOMPRESSION MICRODISCECTOMY  02/04/2011   L2-L3 and L3-L4 with diskectomy and plating  . SHOULDER ARTHROSCOPY W/ ROTATOR CUFF REPAIR Left    Past Medical History:  Diagnosis Date  . Atrial fibrillation (HCC)     post-operative   . Hepatitis   . Hyperlipidemia   . Hypertension   . Premature atrial contractions    systomatic PAC's and documented SVT ( likley atrial tachycardia )  . Stroke (cerebrum) (HCC)    BP 128/76   Pulse 98   Temp 98.6 F (37 C)   Ht 5\' 3"  (1.6 m)   Wt 115 lb 12.8 oz (52.5 kg)   SpO2 97%   BMI 20.51 kg/m   Opioid Risk Score:   Fall Risk Score:  `1  Depression screen PHQ 2/9  Depression screen [facility] 2/9 07/11/2020 03/30/2020  Decreased Interest 0 3  Down, Depressed, Hopeless 0 0  PHQ - 2 Score 0 3  Altered sleeping - 3  Tired, decreased energy - 3  Change in appetite - 0  Feeling bad or failure about yourself  - 0  Trouble concentrating - 0  Moving slowly or fidgety/restless - 0  Suicidal thoughts - 0  PHQ-9 Score - 9   Review of Systems  Constitutional: Negative.   HENT: Negative.   Eyes: Negative.   Respiratory: Negative.   Cardiovascular: Negative.   Gastrointestinal: Negative.   Endocrine: Negative.   Genitourinary: Negative.   Musculoskeletal: Positive for gait problem.       Hips area and left upper thigh  Skin: Negative.   Allergic/Immunologic: Negative.   Hematological: Negative.   Psychiatric/Behavioral: Negative.        Objective:   Physical Exam  Awake, alert, appropriate, accompanied by husband, NAD Using cane Scoliosis notable     Assessment & Plan:    Patient is a 74 yr old female with hx of "2 strokes, prevention of additional strokes, on Eliquis, Afib, sees cardiology, HTN, has severe scoliosis- here for RLE painf/u.Marland Kitchen  Poor proprioception -poor balance   1. Come off Duloxetine/Cymbalta- last day is today and can stop immediately.    2. Keppra- Levicetracem- 500 mg 2x/day x 1 week- 1000 mg 2x/day- for nerve pain.   3. Getting COVID booster today. Wait 2 days to start [place].   4. Don't want to try Gabapentin because can cause poor/worse balance.    5. F/U in 6 weeks.   I spent a total of 25 minutes on visit- as  detailed above.

## 2020-09-26 NOTE — Patient Instructions (Signed)
Patient is a 74 yr old female with hx of "2 strokes, prevention of additional strokes, on Eliquis, Afib, sees cardiology, HTN, has severe scoliosis- here for RLE painf/u.Marland Kitchen  Poor proprioception -poor balance   1. Come off Duloxetine/Cymbalta- last day is today and can stop immediately.    2. Keppra- Levicetracem- 500 mg 2x/day x 1 week- 1000 mg 2x/day- for nerve pain.   3. Getting COVID booster today. Wait 2 days to start [place].   4. Don't want to try Gabapentin because can cause poor/worse balance.    5. F/U in 6 weeks.

## 2020-10-03 DIAGNOSIS — I129 Hypertensive chronic kidney disease with stage 1 through stage 4 chronic kidney disease, or unspecified chronic kidney disease: Secondary | ICD-10-CM | POA: Diagnosis not present

## 2020-10-03 DIAGNOSIS — N182 Chronic kidney disease, stage 2 (mild): Secondary | ICD-10-CM | POA: Diagnosis not present

## 2020-10-03 DIAGNOSIS — K754 Autoimmune hepatitis: Secondary | ICD-10-CM | POA: Diagnosis not present

## 2020-10-03 DIAGNOSIS — K745 Biliary cirrhosis, unspecified: Secondary | ICD-10-CM | POA: Diagnosis not present

## 2020-10-06 DIAGNOSIS — H04123 Dry eye syndrome of bilateral lacrimal glands: Secondary | ICD-10-CM | POA: Diagnosis not present

## 2020-10-06 DIAGNOSIS — H40013 Open angle with borderline findings, low risk, bilateral: Secondary | ICD-10-CM | POA: Diagnosis not present

## 2020-10-06 DIAGNOSIS — H43811 Vitreous degeneration, right eye: Secondary | ICD-10-CM | POA: Diagnosis not present

## 2020-10-06 DIAGNOSIS — H53461 Homonymous bilateral field defects, right side: Secondary | ICD-10-CM | POA: Diagnosis not present

## 2020-10-06 DIAGNOSIS — Z961 Presence of intraocular lens: Secondary | ICD-10-CM | POA: Diagnosis not present

## 2020-10-06 DIAGNOSIS — H0102B Squamous blepharitis left eye, upper and lower eyelids: Secondary | ICD-10-CM | POA: Diagnosis not present

## 2020-10-06 DIAGNOSIS — H0102A Squamous blepharitis right eye, upper and lower eyelids: Secondary | ICD-10-CM | POA: Diagnosis not present

## 2020-10-09 LAB — CUP PACEART REMOTE DEVICE CHECK
Date Time Interrogation Session: 20211107023521
Implantable Pulse Generator Implant Date: 20191015

## 2020-10-17 ENCOUNTER — Ambulatory Visit (INDEPENDENT_AMBULATORY_CARE_PROVIDER_SITE_OTHER): Payer: Medicare Other

## 2020-10-17 DIAGNOSIS — I480 Paroxysmal atrial fibrillation: Secondary | ICD-10-CM

## 2020-10-18 NOTE — Progress Notes (Signed)
Carelink Summary Report / Loop Recorder 

## 2020-10-19 ENCOUNTER — Other Ambulatory Visit: Payer: Self-pay

## 2020-10-19 ENCOUNTER — Encounter: Payer: Self-pay | Admitting: Internal Medicine

## 2020-10-19 ENCOUNTER — Ambulatory Visit (INDEPENDENT_AMBULATORY_CARE_PROVIDER_SITE_OTHER): Payer: Medicare Other | Admitting: Internal Medicine

## 2020-10-19 VITALS — BP 126/68 | HR 106 | Ht 63.0 in | Wt 116.4 lb

## 2020-10-19 DIAGNOSIS — D6869 Other thrombophilia: Secondary | ICD-10-CM | POA: Diagnosis not present

## 2020-10-19 DIAGNOSIS — I4819 Other persistent atrial fibrillation: Secondary | ICD-10-CM | POA: Diagnosis not present

## 2020-10-19 DIAGNOSIS — I10 Essential (primary) hypertension: Secondary | ICD-10-CM | POA: Diagnosis not present

## 2020-10-19 NOTE — Progress Notes (Signed)
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 female who presents today for routine electrophysiology followup.  Since last being seen in our clinic, the patient reports doing reasonably well.  She has no cardiac concerns.  She is very agitated and states "I want to stop ALL of my medicines.  I am on too many medicines.  I am hyper today because I am taking too much medicine". She is unaware of afib symptoms. She is primarily concerned about chronic pain for which she has followed by primary care. She reports frequent dizziness.  Today, she denies symptoms of palpitations, chest pain, shortness of breath,  lower extremity edema, presyncope, or syncope.  The patient is otherwise without complaint today.   Past Medical History:  Diagnosis Date  . Atrial fibrillation (HCC)    post-operative   . Hepatitis   . Hyperlipidemia   . Hypertension   . Premature atrial contractions    systomatic PAC's and documented SVT ( likley atrial tachycardia )  . Stroke (cerebrum) [facility])    Past Surgical History:  Procedure Laterality Date  . HEMORROIDECTOMY    . LOOP RECORDER INSERTION N/A 09/16/2018   Procedure: LOOP RECORDER INSERTION;  Surgeon: Thompson Grayer, MD;  Location: [place] [facility];  Service: Cardiovascular;  Laterality: N/A;  . LUMBAR LAMINECTOMY/DECOMPRESSION MICRODISCECTOMY  02/04/2011   L2-L3 and L3-L4 with diskectomy and plating  . SHOULDER ARTHROSCOPY W/ ROTATOR CUFF REPAIR Left     ROS- all systems are reviewed and negatives except as per HPI above  Current Outpatient Medications  Medication Sig Dispense Refill  . acetaminophen (TYLENOL) 500 MG tablet Take 1,500 mg by mouth daily as needed for moderate pain.     Marland Kitchen acyclovir (ZOVIRAX) 200 MG capsule Take 200 mg by mouth 2 (two) times daily.     . BELSOMRA 20 MG TABS TAKE 1 TABLET BY MOUTH AT BEDTIME 30 tablet 5  . diltiazem (CARDIZEM CD) 180 MG 24 hr capsule Take 1 capsule (180 mg total) by mouth  daily. 90 capsule 1  . DULoxetine (CYMBALTA) 30 MG capsule Take 1 capsule (30 mg total) by mouth daily. X 1 week then 60 mg daily- for nerve pain 60 capsule 5  . ELIQUIS 5 MG TABS tablet TAKE 1 TABLET(5 MG) BY MOUTH TWICE DAILY 60 tablet 5  . levETIRAcetam (KEPPRA) 500 MG tablet Take 1 tablet (500 mg total) by mouth 2 (two) times daily. X 1 week then 1000 mg 2x/day- for nerve pain- 120 tablet 5  . LORazepam (ATIVAN) 1 MG tablet Take 1 mg by mouth at bedtime.     Marland Kitchen losartan (COZAAR) 25 MG tablet Take 25 mg by mouth daily.    . nitroGLYCERIN (NITROSTAT) 0.4 MG SL tablet Place 1 tablet under the tongue every 5 minutes. Max 3 doses. If no relief after 3 doses, call 911 25 tablet 3  . oxyCODONE-acetaminophen (PERCOCET) 7.5-325 MG tablet Take 2 tablets by mouth 2 (two) times daily.    . pregabalin (LYRICA) 50 MG capsule Take 1 capsule (50 mg total) by mouth 3 (three) times daily. 90 capsule 5  . simvastatin (ZOCOR) 20 MG tablet Take 20 mg by mouth every evening.     . ursodiol (ACTIGALL) 300 MG capsule Take 300 mg by mouth 2 (two) times daily.       No current facility-administered medications for this visit.    Physical Exam: Vitals:   10/19/20 1016  BP: 126/68  Pulse: (!) 106  SpO2: 98%  Weight: 116 lb 6.4 oz (52.8 kg)  Height: 5\' 3"  (1.6 m)    GEN- The patient is well appearing, alert and oriented x 3 today.   Head- normocephalic, atraumatic Eyes-  Sclera clear, conjunctiva pink Ears- hearing intact Oropharynx- clear Lungs-   normal work of breathing Heart- irregular rate and rhythm  GI- soft  Extremities- no clubbing, cyanosis, or edema Psych- very agitated, probably manic today Pressured speech   Wt Readings from Last 3 Encounters:  10/19/20 116 lb 6.4 oz (52.8 kg)  09/26/20 115 lb 12.8 oz (52.5 kg)  08/12/20 120 lb 3.2 oz (54.5 kg)    EKG tracing ordered today is personally reviewed and shows afib, V rate 106 bpm  Assessment and Plan:  1. Persistent afib afib burden  has increased She is now more persistent chads2vasc score is 5. She is on eliquis We discussed importance of compliance with rate control and anticoagulation today.  I have offered Watchman as an alternative to [facility] therapy which she declines today.  2. HTN Well controlled Stop losartan per her request  3. Tobacco Remains quite!  4. HL She is on zocor but wishes to stop it today Stop her statin per her request  Risks, benefits and potential toxicities for medications prescribed and/or refilled reviewed with patient today.   Return in 6 months  Thompson Grayer MD, [facility] 10/19/2020 10:25 AM

## 2020-10-19 NOTE — Patient Instructions (Signed)
Medication Instructions:  Stop Simvastatin Stop Losartan   *If you need a refill on your cardiac medications before your next appointment, please call your pharmacy*  Lab Work: None ordered.  If you have labs (blood work) drawn today and your tests are completely normal, you will receive your results only by: Marland Kitchen MyChart Message (if you have MyChart) OR . A paper copy in the mail If you have any lab test that is abnormal or we need to change your treatment, we will call you to review the results.  Testing/Procedures: None ordered.  Follow-Up: At [facility], you and your health needs are our priority.  As part of our continuing mission to provide you with exceptional heart care, we have created designated Provider Care Teams.  These Care Teams include your primary Cardiologist (physician) and Advanced Practice Providers (APPs -  Physician Assistants and Nurse Practitioners) who all work together to provide you with the care you need, when you need it.  We recommend signing up for the patient portal called "MyChart".  Sign up information is provided on this After Visit Summary.  MyChart is used to connect with patients for Virtual Visits (Telemedicine).  Patients are able to view lab/test results, encounter notes, upcoming appointments, etc.  Non-urgent messages can be sent to your provider as well.   To learn more about what you can do with MyChart, go to [email or web address].    Your next appointment:   Your physician wants you to follow-up in: 6 months with Dr. Rayann Heman. You will receive a reminder letter in the mail two months in advance. If you don't receive a letter, please call our office to schedule the follow-up appointment.    Other Instructions:

## 2020-10-21 DIAGNOSIS — H40013 Open angle with borderline findings, low risk, bilateral: Secondary | ICD-10-CM | POA: Diagnosis not present

## 2020-10-21 DIAGNOSIS — H53461 Homonymous bilateral field defects, right side: Secondary | ICD-10-CM | POA: Diagnosis not present

## 2020-10-21 DIAGNOSIS — Z961 Presence of intraocular lens: Secondary | ICD-10-CM | POA: Diagnosis not present

## 2020-10-21 DIAGNOSIS — H0102B Squamous blepharitis left eye, upper and lower eyelids: Secondary | ICD-10-CM | POA: Diagnosis not present

## 2020-10-21 DIAGNOSIS — H04123 Dry eye syndrome of bilateral lacrimal glands: Secondary | ICD-10-CM | POA: Diagnosis not present

## 2020-10-21 DIAGNOSIS — H0102A Squamous blepharitis right eye, upper and lower eyelids: Secondary | ICD-10-CM | POA: Diagnosis not present

## 2020-11-02 DIAGNOSIS — N182 Chronic kidney disease, stage 2 (mild): Secondary | ICD-10-CM | POA: Diagnosis not present

## 2020-11-02 DIAGNOSIS — K745 Biliary cirrhosis, unspecified: Secondary | ICD-10-CM | POA: Diagnosis not present

## 2020-11-02 DIAGNOSIS — I129 Hypertensive chronic kidney disease with stage 1 through stage 4 chronic kidney disease, or unspecified chronic kidney disease: Secondary | ICD-10-CM | POA: Diagnosis not present

## 2020-11-02 DIAGNOSIS — K754 Autoimmune hepatitis: Secondary | ICD-10-CM | POA: Diagnosis not present

## 2020-11-07 ENCOUNTER — Encounter: Payer: Self-pay | Admitting: Physical Medicine and Rehabilitation

## 2020-11-07 ENCOUNTER — Other Ambulatory Visit: Payer: Self-pay

## 2020-11-07 ENCOUNTER — Encounter
Payer: Medicare Other | Attending: Physical Medicine and Rehabilitation | Admitting: Physical Medicine and Rehabilitation

## 2020-11-07 VITALS — BP 133/73 | HR 69 | Temp 98.3°F | Ht 63.0 in | Wt 116.6 lb

## 2020-11-07 DIAGNOSIS — M5386 Other specified dorsopathies, lumbar region: Secondary | ICD-10-CM | POA: Insufficient documentation

## 2020-11-07 NOTE — Progress Notes (Addendum)
Subjective:    Patient ID: Abigail Sullivan, female    DOB: 15-Jan-1946, 74 y.o.   MRN: [id number]  HPI   Patient is a 74 yr old female with hx of "2 strokes, prevention of additional strokes, on Eliquis, Afib, sees cardiology, HTN, has severe scoliosis- here for RLE painf/u.Marland Kitchen  Poor proprioception -poor balance  Keppra- doesn't help at all.   Ibuprofen- takes 4 pills 2x/day- in 5 minutes, pain goes away.   Is there something that's comparable?    Pain Inventory Average Pain 5 Pain Right Now 0 My pain is intermittent, sharp, stabbing and aching  In the last 24 hours, has pain interfered with the following? General activity 4 Relation with others 0 Enjoyment of life 2 What TIME of day is your pain at its worst? morning  Sleep (in general) Good  Pain is worse with: walking and standing Pain improves with: rest Relief from Meds: 9  Family History  Problem Relation Age of Onset  . Hypertension Mother   . Other Father        CABGx4   Social History   Socioeconomic History  . Marital status: Married    Spouse name: Ed  . Number of children: Not on file  . Years of education: 32  . Highest education level: 12th grade  Occupational History  . Occupation: retired  Tobacco Use  . Smoking status: Former Smoker    Packs/day: 0.30    Types: Cigarettes    Quit date: 12/22/2016    Years since quitting: 3.8  . Smokeless tobacco: Never Used  . Tobacco comment: smokes 3 cigarettes per day, not interested in quitting  Vaping Use  . Vaping Use: Never used  Substance and Sexual Activity  . Alcohol use: No  . Drug use: No  . Sexual activity: Not on file  Other Topics Concern  . Not on file  Social History Narrative   Patient is right-handed.   Social Determinants of Health   Financial Resource Strain:   . Difficulty of Paying Living Expenses: Not on file  Food Insecurity:   . Worried About Charity fundraiser in the Last Year: Not on file  . Ran Out of Food in the  Last Year: Not on file  Transportation Needs:   . Lack of Transportation (Medical): Not on file  . Lack of Transportation (Non-Medical): Not on file  Physical Activity:   . Days of Exercise per Week: Not on file  . Minutes of Exercise per Session: Not on file  Stress:   . Feeling of Stress : Not on file  Social Connections:   . Frequency of Communication with Friends and Family: Not on file  . Frequency of Social Gatherings with Friends and Family: Not on file  . Attends Religious Services: Not on file  . Active Member of Clubs or Organizations: Not on file  . Attends Archivist Meetings: Not on file  . Marital Status: Not on file   Past Surgical History:  Procedure Laterality Date  . HEMORROIDECTOMY    . LOOP RECORDER INSERTION N/A 09/16/2018   Procedure: LOOP RECORDER INSERTION;  Surgeon: Thompson Grayer, MD;  Location: [place] [facility];  Service: Cardiovascular;  Laterality: N/A;  . LUMBAR LAMINECTOMY/DECOMPRESSION MICRODISCECTOMY  02/04/2011   L2-L3 and L3-L4 with diskectomy and plating  . SHOULDER ARTHROSCOPY W/ ROTATOR CUFF REPAIR Left    Past Surgical History:  Procedure Laterality Date  . HEMORROIDECTOMY    . LOOP RECORDER INSERTION  N/A 09/16/2018   Procedure: LOOP RECORDER INSERTION;  Surgeon: Thompson Grayer, MD;  Location: [place] [facility];  Service: Cardiovascular;  Laterality: N/A;  . LUMBAR LAMINECTOMY/DECOMPRESSION MICRODISCECTOMY  02/04/2011   L2-L3 and L3-L4 with diskectomy and plating  . SHOULDER ARTHROSCOPY W/ ROTATOR CUFF REPAIR Left    Past Medical History:  Diagnosis Date  . Atrial fibrillation (HCC)    post-operative   . Hepatitis   . Hyperlipidemia   . Hypertension   . Premature atrial contractions    systomatic PAC's and documented SVT ( likley atrial tachycardia )  . Stroke (cerebrum) (HCC)    BP 133/73   Pulse 69   Temp 98.3 F (36.8 C)   Ht 5\' 3"  (1.6 m)   Wt 116 lb 9.6 oz (52.9 kg)   SpO2 98%   BMI 20.65 kg/m   Opioid Risk  Score:   Fall Risk Score:  `1  Depression screen PHQ 2/9  Depression screen [facility] 2/9 09/26/2020 07/11/2020 03/30/2020  Decreased Interest 0 0 3  Down, Depressed, Hopeless 0 0 0  PHQ - 2 Score 0 0 3  Altered sleeping - - 3  Tired, decreased energy - - 3  Change in appetite - - 0  Feeling bad or failure about yourself  - - 0  Trouble concentrating - - 0  Moving slowly or fidgety/restless - - 0  Suicidal thoughts - - 0  PHQ-9 Score - - 9   Review of Systems  Musculoskeletal: Positive for back pain and gait problem.  All other systems reviewed and are negative.      Objective:   Physical Exam  Awake, alert, appropriate, husband accompanying, NAD Wants [place]  to dispose of meds/give her a refund.        Assessment & Plan:   Patient is a 74 yr old female with hx of "2 strokes, prevention of additional strokes, on Eliquis, Afib, sees cardiology, HTN, has severe scoliosis- here for RLE painf/u.Marland Kitchen  Poor proprioception -poor balance  1.  Suggest trying- lower dose of Ibuprofen- no more than 400-600 mg at a time.   2. Please take all meds with food. - crackers, milk, etc.   3. F/U as needed- was very clear with pt her bleeding risk is MUCH higher taking both Eliquis and Ibuprofen- try to do everything to avoid falling, because she can get bleeding in the brain if falls- avoid getting something off the floor, and putting herself in situations she might fall- I don't agree with her plan, but Ibuprofen is Over the counter and at least trying to get her to reduce dose-    I spent a total of 15 minutes on visit- as detailed above.

## 2020-11-07 NOTE — Patient Instructions (Signed)
Patient is a 74 yr old female with hx of "2 strokes, prevention of additional strokes, on Eliquis, Afib, sees cardiology, HTN, has severe scoliosis- here for RLE painf/u.Marland Kitchen  Poor proprioception -poor balance  1.  Suggest trying- lower dose of Ibuprofen- no more than 400-600 mg at a time.   2. Please take all meds with food. - crackers, milk, etc.   3. F/U as needed-

## 2020-11-15 ENCOUNTER — Telehealth: Payer: Self-pay

## 2020-11-15 NOTE — Telephone Encounter (Signed)
Patient returned phone call.  States she has had some increased stress lately.  Pt reports starting about a week ago,  she has been having a lot of night sweats and chills.  These feelings are sometimes accompanied by dizziness and Nausea, but N/V has not occurred in the last couple of days.    Pt reluctant to attend AF clinic due to how far she lives from [place], would like Dr. Jackalyn Lombard input on what is going on.

## 2020-11-15 NOTE — Telephone Encounter (Signed)
ILR AF alert, onset 11/12/20 @ 0512. Presenting rhythm this AM per Carelink documents AF with rates 100-110bpm (4818).   Pt has known history of asymptomatic persistent AF.  Current meds include- Diltiazem 180mg  daily, Eliquis 5mg  BID  Attempted to reach pt to assess symptoms and med compliance.  No answer, LVM requesting callback.

## 2020-11-17 DIAGNOSIS — R42 Dizziness and giddiness: Secondary | ICD-10-CM | POA: Diagnosis not present

## 2020-11-17 DIAGNOSIS — D6869 Other thrombophilia: Secondary | ICD-10-CM | POA: Diagnosis not present

## 2020-11-17 DIAGNOSIS — F3110 Bipolar disorder, current episode manic without psychotic features, unspecified: Secondary | ICD-10-CM | POA: Diagnosis not present

## 2020-11-17 DIAGNOSIS — R232 Flushing: Secondary | ICD-10-CM | POA: Diagnosis not present

## 2020-11-17 DIAGNOSIS — I4891 Unspecified atrial fibrillation: Secondary | ICD-10-CM | POA: Diagnosis not present

## 2020-11-21 ENCOUNTER — Ambulatory Visit (INDEPENDENT_AMBULATORY_CARE_PROVIDER_SITE_OTHER): Payer: Medicare Other

## 2020-11-21 DIAGNOSIS — I4819 Other persistent atrial fibrillation: Secondary | ICD-10-CM | POA: Diagnosis not present

## 2020-11-21 LAB — CUP PACEART REMOTE DEVICE CHECK
Date Time Interrogation Session: 20211218231521
Implantable Pulse Generator Implant Date: 20191015

## 2020-11-29 NOTE — Telephone Encounter (Signed)
She is persistently in afib.  We discussed at length during her last office visit.  She did not wish to make changes.   Keep follow-up as scheduled.

## 2020-12-05 NOTE — Progress Notes (Signed)
Carelink Summary Report / Loop Recorder 

## 2020-12-09 DIAGNOSIS — K743 Primary biliary cirrhosis: Secondary | ICD-10-CM | POA: Diagnosis not present

## 2020-12-13 ENCOUNTER — Telehealth: Payer: Self-pay | Admitting: Physical Medicine and Rehabilitation

## 2020-12-13 NOTE — Telephone Encounter (Signed)
Refill request for Pregabalin but the request from pharmacy states discontinued by Dr. Dagoberto Ligas on 05/23/20. Is patient on medication? Last note does not indicate.

## 2020-12-13 NOTE — Telephone Encounter (Signed)
Pt has discharged from my clinic and doesn't want to come back- she planned on continuing her Percocet/Oxy from another doctor and stop all nerve pain meds-  If she needs refills, needs to come from PCP.

## 2020-12-13 NOTE — Telephone Encounter (Signed)
I will deny refill.

## 2020-12-16 DIAGNOSIS — K754 Autoimmune hepatitis: Secondary | ICD-10-CM | POA: Diagnosis not present

## 2020-12-16 DIAGNOSIS — E782 Mixed hyperlipidemia: Secondary | ICD-10-CM | POA: Diagnosis not present

## 2020-12-22 DIAGNOSIS — G4700 Insomnia, unspecified: Secondary | ICD-10-CM | POA: Diagnosis not present

## 2020-12-23 DIAGNOSIS — K754 Autoimmune hepatitis: Secondary | ICD-10-CM | POA: Diagnosis not present

## 2020-12-23 DIAGNOSIS — I700 Atherosclerosis of aorta: Secondary | ICD-10-CM | POA: Diagnosis not present

## 2020-12-23 DIAGNOSIS — K745 Biliary cirrhosis, unspecified: Secondary | ICD-10-CM | POA: Diagnosis not present

## 2020-12-23 DIAGNOSIS — N1831 Chronic kidney disease, stage 3a: Secondary | ICD-10-CM | POA: Diagnosis not present

## 2020-12-24 LAB — CUP PACEART REMOTE DEVICE CHECK
Date Time Interrogation Session: 20220121005403
Implantable Pulse Generator Implant Date: 20191015

## 2020-12-26 ENCOUNTER — Ambulatory Visit (INDEPENDENT_AMBULATORY_CARE_PROVIDER_SITE_OTHER): Payer: Medicare Other

## 2020-12-26 DIAGNOSIS — I480 Paroxysmal atrial fibrillation: Secondary | ICD-10-CM | POA: Diagnosis not present

## 2021-01-02 DIAGNOSIS — R232 Flushing: Secondary | ICD-10-CM | POA: Diagnosis not present

## 2021-01-02 DIAGNOSIS — Z681 Body mass index (BMI) 19 or less, adult: Secondary | ICD-10-CM | POA: Diagnosis not present

## 2021-01-03 DIAGNOSIS — K745 Biliary cirrhosis, unspecified: Secondary | ICD-10-CM | POA: Diagnosis not present

## 2021-01-03 DIAGNOSIS — N1831 Chronic kidney disease, stage 3a: Secondary | ICD-10-CM | POA: Diagnosis not present

## 2021-01-03 DIAGNOSIS — K754 Autoimmune hepatitis: Secondary | ICD-10-CM | POA: Diagnosis not present

## 2021-01-03 DIAGNOSIS — I700 Atherosclerosis of aorta: Secondary | ICD-10-CM | POA: Diagnosis not present

## 2021-01-06 NOTE — Progress Notes (Signed)
Carelink Summary Report / Loop Recorder 

## 2021-01-09 ENCOUNTER — Other Ambulatory Visit: Payer: Self-pay | Admitting: Internal Medicine

## 2021-01-09 NOTE — Telephone Encounter (Signed)
73f, 52.9kg, Creatinine, Serum 0.860 MG/ (12/16/2020), lovw/allred (10/19/20). Refill request received for eliquis 5mg  and patient qualifies refill sent.

## 2021-01-28 LAB — CUP PACEART REMOTE DEVICE CHECK
Date Time Interrogation Session: 20220223021103
Implantable Pulse Generator Implant Date: 20191015

## 2021-01-30 ENCOUNTER — Ambulatory Visit (INDEPENDENT_AMBULATORY_CARE_PROVIDER_SITE_OTHER): Payer: Medicare Other

## 2021-01-30 DIAGNOSIS — I480 Paroxysmal atrial fibrillation: Secondary | ICD-10-CM | POA: Diagnosis not present

## 2021-01-31 DIAGNOSIS — K745 Biliary cirrhosis, unspecified: Secondary | ICD-10-CM | POA: Diagnosis not present

## 2021-01-31 DIAGNOSIS — N1831 Chronic kidney disease, stage 3a: Secondary | ICD-10-CM | POA: Diagnosis not present

## 2021-01-31 DIAGNOSIS — K754 Autoimmune hepatitis: Secondary | ICD-10-CM | POA: Diagnosis not present

## 2021-01-31 DIAGNOSIS — I700 Atherosclerosis of aorta: Secondary | ICD-10-CM | POA: Diagnosis not present

## 2021-02-01 DIAGNOSIS — M5416 Radiculopathy, lumbar region: Secondary | ICD-10-CM | POA: Diagnosis not present

## 2021-02-06 NOTE — Progress Notes (Signed)
Carelink Summary Report / Loop Recorder 

## 2021-02-21 DIAGNOSIS — E782 Mixed hyperlipidemia: Secondary | ICD-10-CM | POA: Diagnosis not present

## 2021-02-21 DIAGNOSIS — K754 Autoimmune hepatitis: Secondary | ICD-10-CM | POA: Diagnosis not present

## 2021-02-23 ENCOUNTER — Other Ambulatory Visit: Payer: Self-pay | Admitting: Gastroenterology

## 2021-02-23 DIAGNOSIS — K743 Primary biliary cirrhosis: Secondary | ICD-10-CM

## 2021-02-23 DIAGNOSIS — K862 Cyst of pancreas: Secondary | ICD-10-CM

## 2021-02-27 ENCOUNTER — Ambulatory Visit (INDEPENDENT_AMBULATORY_CARE_PROVIDER_SITE_OTHER): Payer: Medicare Other

## 2021-02-27 DIAGNOSIS — I480 Paroxysmal atrial fibrillation: Secondary | ICD-10-CM

## 2021-02-27 LAB — CUP PACEART REMOTE DEVICE CHECK
Date Time Interrogation Session: 20220328031349
Implantable Pulse Generator Implant Date: 20191015

## 2021-03-03 DIAGNOSIS — H832X9 Labyrinthine dysfunction, unspecified ear: Secondary | ICD-10-CM | POA: Diagnosis not present

## 2021-03-03 DIAGNOSIS — R232 Flushing: Secondary | ICD-10-CM | POA: Diagnosis not present

## 2021-03-03 DIAGNOSIS — I4891 Unspecified atrial fibrillation: Secondary | ICD-10-CM | POA: Diagnosis not present

## 2021-03-03 DIAGNOSIS — I2510 Atherosclerotic heart disease of native coronary artery without angina pectoris: Secondary | ICD-10-CM | POA: Diagnosis not present

## 2021-03-03 DIAGNOSIS — F329 Major depressive disorder, single episode, unspecified: Secondary | ICD-10-CM | POA: Diagnosis not present

## 2021-03-03 DIAGNOSIS — E440 Moderate protein-calorie malnutrition: Secondary | ICD-10-CM | POA: Diagnosis not present

## 2021-03-06 ENCOUNTER — Other Ambulatory Visit: Payer: Self-pay

## 2021-03-06 DIAGNOSIS — I10 Essential (primary) hypertension: Secondary | ICD-10-CM

## 2021-03-06 DIAGNOSIS — Z23 Encounter for immunization: Secondary | ICD-10-CM | POA: Diagnosis not present

## 2021-03-09 NOTE — Progress Notes (Signed)
Carelink Summary Report / Loop Recorder 

## 2021-03-10 ENCOUNTER — Other Ambulatory Visit: Payer: Medicare Other

## 2021-03-16 ENCOUNTER — Ambulatory Visit
Admission: RE | Admit: 2021-03-16 | Discharge: 2021-03-16 | Disposition: A | Discharge status: Home / Self Care | Payer: Medicare Other | Source: Ambulatory Visit | Attending: Gastroenterology | Admitting: Gastroenterology

## 2021-03-16 DIAGNOSIS — K743 Primary biliary cirrhosis: Secondary | ICD-10-CM

## 2021-03-16 DIAGNOSIS — K862 Cyst of pancreas: Secondary | ICD-10-CM

## 2021-03-16 MED ORDER — IOPAMIDOL (ISOVUE-300) INJECTION 61%
100.0000 mL | Freq: Once | INTRAVENOUS | Status: AC | PRN
  Administered 2021-03-16: 100 mL via INTRAVENOUS

## 2021-03-19 ENCOUNTER — Other Ambulatory Visit: Payer: Self-pay | Admitting: Internal Medicine

## 2021-03-19 DIAGNOSIS — I480 Paroxysmal atrial fibrillation: Secondary | ICD-10-CM

## 2021-04-02 DIAGNOSIS — F329 Major depressive disorder, single episode, unspecified: Secondary | ICD-10-CM | POA: Diagnosis not present

## 2021-04-02 DIAGNOSIS — I2510 Atherosclerotic heart disease of native coronary artery without angina pectoris: Secondary | ICD-10-CM | POA: Diagnosis not present

## 2021-04-02 DIAGNOSIS — I4891 Unspecified atrial fibrillation: Secondary | ICD-10-CM | POA: Diagnosis not present

## 2021-04-03 ENCOUNTER — Ambulatory Visit (INDEPENDENT_AMBULATORY_CARE_PROVIDER_SITE_OTHER): Payer: Medicare Other

## 2021-04-03 DIAGNOSIS — I480 Paroxysmal atrial fibrillation: Secondary | ICD-10-CM

## 2021-04-04 LAB — CUP PACEART REMOTE DEVICE CHECK
Date Time Interrogation Session: 20220430031418
Implantable Pulse Generator Implant Date: 20191015

## 2021-04-17 DIAGNOSIS — H0102B Squamous blepharitis left eye, upper and lower eyelids: Secondary | ICD-10-CM | POA: Diagnosis not present

## 2021-04-17 DIAGNOSIS — H53461 Homonymous bilateral field defects, right side: Secondary | ICD-10-CM | POA: Diagnosis not present

## 2021-04-17 DIAGNOSIS — Z961 Presence of intraocular lens: Secondary | ICD-10-CM | POA: Diagnosis not present

## 2021-04-17 DIAGNOSIS — H0102A Squamous blepharitis right eye, upper and lower eyelids: Secondary | ICD-10-CM | POA: Diagnosis not present

## 2021-04-17 DIAGNOSIS — H04123 Dry eye syndrome of bilateral lacrimal glands: Secondary | ICD-10-CM | POA: Diagnosis not present

## 2021-04-17 DIAGNOSIS — H40013 Open angle with borderline findings, low risk, bilateral: Secondary | ICD-10-CM | POA: Diagnosis not present

## 2021-04-17 DIAGNOSIS — H43811 Vitreous degeneration, right eye: Secondary | ICD-10-CM | POA: Diagnosis not present

## 2021-04-19 DIAGNOSIS — G4700 Insomnia, unspecified: Secondary | ICD-10-CM | POA: Diagnosis not present

## 2021-04-21 NOTE — Progress Notes (Signed)
Carelink Summary Report / Loop Recorder 

## 2021-04-24 DIAGNOSIS — K754 Autoimmune hepatitis: Secondary | ICD-10-CM | POA: Diagnosis not present

## 2021-05-03 DIAGNOSIS — I2510 Atherosclerotic heart disease of native coronary artery without angina pectoris: Secondary | ICD-10-CM | POA: Diagnosis not present

## 2021-05-03 DIAGNOSIS — I4891 Unspecified atrial fibrillation: Secondary | ICD-10-CM | POA: Diagnosis not present

## 2021-05-03 DIAGNOSIS — F329 Major depressive disorder, single episode, unspecified: Secondary | ICD-10-CM | POA: Diagnosis not present

## 2021-05-16 DIAGNOSIS — Z23 Encounter for immunization: Secondary | ICD-10-CM | POA: Diagnosis not present

## 2021-05-16 DIAGNOSIS — W19XXXA Unspecified fall, initial encounter: Secondary | ICD-10-CM | POA: Diagnosis not present

## 2021-05-16 DIAGNOSIS — H9201 Otalgia, right ear: Secondary | ICD-10-CM | POA: Diagnosis not present

## 2021-05-16 DIAGNOSIS — Z681 Body mass index (BMI) 19 or less, adult: Secondary | ICD-10-CM | POA: Diagnosis not present

## 2021-05-16 DIAGNOSIS — R202 Paresthesia of skin: Secondary | ICD-10-CM | POA: Diagnosis not present

## 2021-05-19 ENCOUNTER — Other Ambulatory Visit: Payer: Self-pay | Admitting: Internal Medicine

## 2021-05-19 NOTE — Telephone Encounter (Signed)
Prescription refill request for Eliquis received. Indication:atrial fib Last office visit:1/22 Scr:0.9 Age: 75 Weight:52.9 kg  Prescription refilled

## 2021-05-22 DIAGNOSIS — I739 Peripheral vascular disease, unspecified: Secondary | ICD-10-CM | POA: Diagnosis not present

## 2021-05-22 DIAGNOSIS — G609 Hereditary and idiopathic neuropathy, unspecified: Secondary | ICD-10-CM | POA: Diagnosis not present

## 2021-05-22 DIAGNOSIS — F3110 Bipolar disorder, current episode manic without psychotic features, unspecified: Secondary | ICD-10-CM | POA: Diagnosis not present

## 2021-05-22 DIAGNOSIS — E782 Mixed hyperlipidemia: Secondary | ICD-10-CM | POA: Diagnosis not present

## 2021-05-26 ENCOUNTER — Other Ambulatory Visit: Payer: Self-pay | Admitting: Family Medicine

## 2021-05-26 DIAGNOSIS — Z1231 Encounter for screening mammogram for malignant neoplasm of breast: Secondary | ICD-10-CM

## 2021-06-02 DIAGNOSIS — F3110 Bipolar disorder, current episode manic without psychotic features, unspecified: Secondary | ICD-10-CM | POA: Diagnosis not present

## 2021-06-02 DIAGNOSIS — E782 Mixed hyperlipidemia: Secondary | ICD-10-CM | POA: Diagnosis not present

## 2021-06-02 DIAGNOSIS — I739 Peripheral vascular disease, unspecified: Secondary | ICD-10-CM | POA: Diagnosis not present

## 2021-06-14 ENCOUNTER — Other Ambulatory Visit: Payer: Self-pay

## 2021-06-14 ENCOUNTER — Ambulatory Visit
Admission: RE | Admit: 2021-06-14 | Discharge: 2021-06-14 | Disposition: A | Discharge status: Home / Self Care | Payer: Medicare Other | Source: Ambulatory Visit | Attending: Family Medicine | Admitting: Family Medicine

## 2021-06-14 DIAGNOSIS — Z1231 Encounter for screening mammogram for malignant neoplasm of breast: Secondary | ICD-10-CM

## 2021-06-23 DIAGNOSIS — Z681 Body mass index (BMI) 19 or less, adult: Secondary | ICD-10-CM | POA: Diagnosis not present

## 2021-06-23 DIAGNOSIS — G609 Hereditary and idiopathic neuropathy, unspecified: Secondary | ICD-10-CM | POA: Diagnosis not present

## 2021-06-23 DIAGNOSIS — F3110 Bipolar disorder, current episode manic without psychotic features, unspecified: Secondary | ICD-10-CM | POA: Diagnosis not present

## 2021-07-03 DIAGNOSIS — I2510 Atherosclerotic heart disease of native coronary artery without angina pectoris: Secondary | ICD-10-CM | POA: Diagnosis not present

## 2021-07-03 DIAGNOSIS — I4891 Unspecified atrial fibrillation: Secondary | ICD-10-CM | POA: Diagnosis not present

## 2021-07-03 DIAGNOSIS — F329 Major depressive disorder, single episode, unspecified: Secondary | ICD-10-CM | POA: Diagnosis not present

## 2021-07-17 ENCOUNTER — Ambulatory Visit (INDEPENDENT_AMBULATORY_CARE_PROVIDER_SITE_OTHER): Payer: Medicare Other

## 2021-07-17 DIAGNOSIS — I480 Paroxysmal atrial fibrillation: Secondary | ICD-10-CM

## 2021-07-17 DIAGNOSIS — Z79899 Other long term (current) drug therapy: Secondary | ICD-10-CM | POA: Diagnosis not present

## 2021-07-17 LAB — CUP PACEART REMOTE DEVICE CHECK
Date Time Interrogation Session: 20220807032405
Implantable Pulse Generator Implant Date: 20191015

## 2021-07-17 IMAGING — CT CT ANGIO HEAD
1 of 4 series · 4 of 30 positions shown · IV contrast (APPLIED)
Comparison: MR head without contrast 08/25/2019. MRA of the neck
and head 02/16/2018.

CLINICAL DATA: Dizziness and falling. Vertebrobasilar
insufficiency.

EXAM:
CT ANGIOGRAPHY HEAD
TECHNIQUE: Multidetector CT imaging of the head was performed using the
standard protocol during bolus administration of intravenous
contrast. Multiplanar CT image reconstructions and MIPs were
obtained to evaluate the vascular anatomy.
CONTRAST:  75mL 5KQJE0-17K IOPAMIDOL (5KQJE0-17K) INJECTION 76%

[Series 6: head angio · axial · 0.47mm/px · z∈[-197,-89]mm · 4 of 60 slices shown]
[im 12/60  brain]
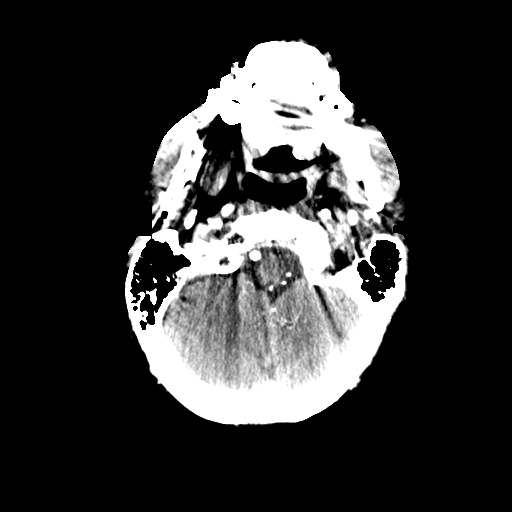
[im 24/60  bone]
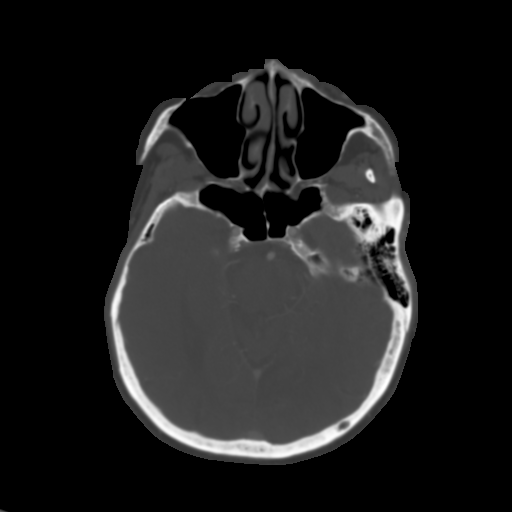
[im 36/60  brain]
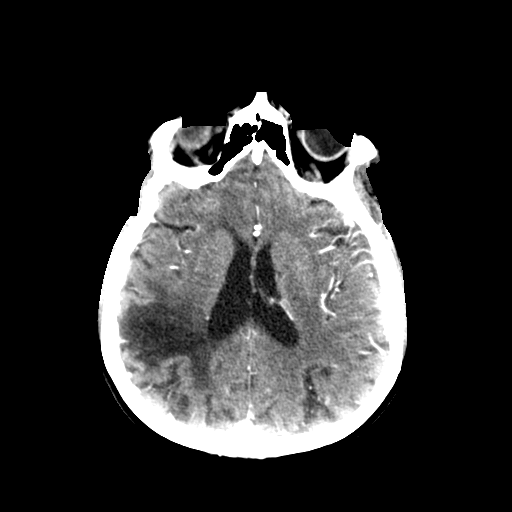
[im 48/60  bone]
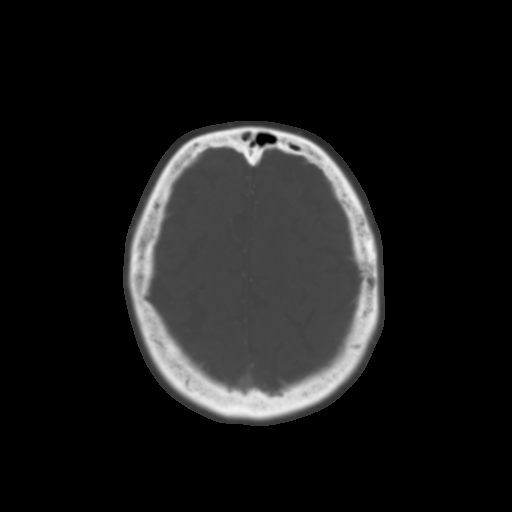

[4 of 30 positions shown; findings below may reference images not displayed]

FINDINGS: CT HEAD

Brain: Remote infarcts are again noted in the posterior right
frontal and parietal lobe as well as the medial inferior left
occipital lobe. No new infarcts are present. There is no acute
hemorrhage or mass lesion. White matter disease is otherwise stable.
The ventricles are proportionate to the degree of atrophy. No
significant extraaxial fluid collection is present.

Vascular: No hyperdense vessel or unexpected calcification.

Skull: Calvarium is intact. No focal lytic or blastic lesions are
present.

Sinuses: The paranasal sinuses and mastoid air cells are clear.

Orbits: Bilateral lens replacements are noted. Globes and orbits are
otherwise unremarkable.

CTA HEAD

Anterior circulation: A 2.5 mm right posterior communicating artery
aneurysm is present. There is an infundibulum of the left posterior
communicating artery. Mild atherosclerotic changes are noted within
the cavernous internal carotid arteries bilaterally without
significant stenosis relative to the more distal vessels. ICA
termini are within normal limits. The A1 and M1 segments are normal.
MCA bifurcations are intact. ACA and MCA branch vessels are within
normal limits.

Posterior circulation: Right vertebral artery is the dominant
vessel. The left vertebral artery is hypoplastic and centrally
terminates at the PICA. Basilar artery is normal. Both posterior
cerebral arteries originate from basilar tip. PCA branch vessels are
within normal limits bilaterally. There is no significant stenosis.

Venous sinuses: Dural sinuses are patent. The straight sinus deep
cerebral veins are intact. Cortical veins are unremarkable. No focal
vascular malformation is present.

Anatomic variants: None
IMPRESSION: 1. 2.5 mm right posterior communicating artery aneurysm.
2. Mild atherosclerotic changes within the cavernous internal
carotid arteries bilaterally without significant stenosis relative
to the more distal vessels.
3. Otherwise normal CTA Circle of Willis without significant
proximal stenosis, aneurysm, or branch vessel occlusion.

## 2021-07-19 DIAGNOSIS — K754 Autoimmune hepatitis: Secondary | ICD-10-CM | POA: Diagnosis not present

## 2021-07-19 DIAGNOSIS — E782 Mixed hyperlipidemia: Secondary | ICD-10-CM | POA: Diagnosis not present

## 2021-07-31 ENCOUNTER — Ambulatory Visit (INDEPENDENT_AMBULATORY_CARE_PROVIDER_SITE_OTHER): Payer: Medicare Other | Admitting: Internal Medicine

## 2021-07-31 ENCOUNTER — Other Ambulatory Visit: Payer: Self-pay

## 2021-07-31 VITALS — BP 126/78 | HR 104 | Ht 63.0 in | Wt 117.4 lb

## 2021-07-31 DIAGNOSIS — I10 Essential (primary) hypertension: Secondary | ICD-10-CM

## 2021-07-31 DIAGNOSIS — E785 Hyperlipidemia, unspecified: Secondary | ICD-10-CM | POA: Diagnosis not present

## 2021-07-31 DIAGNOSIS — I4819 Other persistent atrial fibrillation: Secondary | ICD-10-CM | POA: Diagnosis not present

## 2021-07-31 DIAGNOSIS — Z720 Tobacco use: Secondary | ICD-10-CM | POA: Diagnosis not present

## 2021-07-31 MED ORDER — DILTIAZEM HCL ER COATED BEADS 180 MG PO CP24: 180.0000 mg | ORAL_CAPSULE | Freq: Two times a day (BID) | ORAL | 3 refills | Status: DC

## 2021-07-31 MED ORDER — APIXABAN 5 MG PO TABS: 5.0000 mg | ORAL_TABLET | Freq: Two times a day (BID) | ORAL | 3 refills | Status: DC

## 2021-07-31 NOTE — Progress Notes (Signed)
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  She continues to decline.  Now walking with a walker due to unsteadiness.  + rare palpitations with afib.  + rare presyncope.  Energy is preserved.  Today, she denies symptoms of chest pain, shortness of breath,  lower extremity edema,  or syncope.  The patient is otherwise without complaint today.   Past Medical History:  Diagnosis Date   Atrial fibrillation ([place])    post-operative    Hepatitis    Hyperlipidemia    Hypertension    Premature atrial contractions    systomatic PAC's and documented SVT ( likley atrial tachycardia )   Stroke (cerebrum) ([place])    Past Surgical History:  Procedure Laterality Date   HEMORROIDECTOMY     LOOP RECORDER INSERTION N/A 09/16/2018   Procedure: LOOP RECORDER INSERTION;  Surgeon: Thompson Grayer, MD;  Location: [place] [facility];  Service: Cardiovascular;  Laterality: N/A;   LUMBAR LAMINECTOMY/DECOMPRESSION MICRODISCECTOMY  02/04/2011   L2-L3 and L3-L4 with diskectomy and plating   SHOULDER ARTHROSCOPY W/ ROTATOR CUFF REPAIR Left     ROS- all systems are reviewed and negatives except as per HPI above  Current Outpatient Medications  Medication Sig Dispense Refill   acetaminophen (TYLENOL) 500 MG tablet Take 1,500 mg by mouth daily as needed for moderate pain.      acyclovir (ZOVIRAX) 200 MG capsule Take 200 mg by mouth 2 (two) times daily.      BELSOMRA 20 MG TABS TAKE 1 TABLET BY MOUTH AT BEDTIME 30 tablet 5   diltiazem (CARDIZEM CD) 180 MG 24 hr capsule Take 1 capsule (180 mg total) by mouth daily. 90 capsule 1   diltiazem (TIAZAC) 180 MG 24 hr capsule TAKE 1 CAPSULE BY MOUTH EVERY DAY 90 capsule 2   DULoxetine (CYMBALTA) 30 MG capsule Take 1 capsule (30 mg total) by mouth daily. X 1 week then 60 mg daily- for nerve pain 60 capsule 5   ELIQUIS 5 MG TABS tablet TAKE 1 TABLET(5 MG) BY MOUTH TWICE DAILY 180  tablet 1   levETIRAcetam (KEPPRA) 500 MG tablet Take 1 tablet (500 mg total) by mouth 2 (two) times daily. X 1 week then 1000 mg 2x/day- for nerve pain- 120 tablet 5   LORazepam (ATIVAN) 1 MG tablet Take 1 mg by mouth at bedtime.      losartan (COZAAR) 25 MG tablet Take 25 mg by mouth daily.     nitroGLYCERIN (NITROSTAT) 0.4 MG SL tablet Place 1 tablet under the tongue every 5 minutes. Max 3 doses. If no relief after 3 doses, call 911 25 tablet 3   oxyCODONE-acetaminophen (PERCOCET) 7.5-325 MG tablet Take 2 tablets by mouth 2 (two) times daily.     pregabalin (LYRICA) 50 MG capsule Take 1 capsule (50 mg total) by mouth 3 (three) times daily. 90 capsule 5   simvastatin (ZOCOR) 20 MG tablet Take 20 mg by mouth every evening.      ursodiol (ACTIGALL) 250 MG tablet Take 250 mg by mouth 3 (three) times daily.     ursodiol (ACTIGALL) 300 MG capsule Take 300 mg by mouth 2 (two) times daily.       No current facility-administered medications for this visit.    Physical Exam: Vitals:   07/31/21 1032  BP: 126/78  Pulse: (!) 104  SpO2: 98%  Weight: 117 lb 6.4 oz (53.3 kg)  Height: '5\' 3"'$  (1.6 m)    GEN- The patient is frail appearing, alert and oriented x 3 today.  Uses a walker now Head- normocephalic, atraumatic Eyes-  Sclera clear, conjunctiva pink Ears- hearing intact Oropharynx- clear Lungs- Clear to ausculation bilaterally, normal work of breathing Heart- tachycardic irregular rhythm GI- soft, NT, ND, + BS Extremities- no clubbing, cyanosis, or edema  Wt Readings from Last 3 Encounters:  07/31/21 117 lb 6.4 oz (53.3 kg)  11/07/20 116 lb 9.6 oz (52.9 kg)  10/19/20 116 lb 6.4 oz (52.8 kg)    EKG tracing ordered today is personally reviewed and shows afib, V rates 104 bpm  Assessment and Plan:  Persistent afib Chads2vasc score is 5.  She is on eliquis Her afib burden have increased V rates are elevated  ILR reviewed today Increase diltiazem CD to '180mg'$  BID We should consider  AAD therapy though I am not convinced that she is a good candidate.  Primary ablation could also be considered.  For now, she would prefer rate control. Once ILR reaches RRT, she would likely want a new device placed  2. HTN Stable No change required today  3. HL She is now taking zocor '20mg'$  daily  4. Tobacco Remains quit  Return to see EP APP in 3 months  Thompson Grayer MD, [facility] 07/31/2021 10:51 AM

## 2021-07-31 NOTE — Patient Instructions (Addendum)
Medication Instructions:  Increase diltiazem to 180 mg two times daily  Your physician recommends that you continue on your current medications as directed. Please refer to the Current Medication list given to you today.  Labwork: None ordered.  Testing/Procedures: None ordered.  Follow-Up: Your physician wants you to follow-up in: 11/07/21 at 9:40 am with your care team PA   Legrand Como "Jonni Sanger" Chalmers Cater, [place]    Any Other Special Instructions Will Be Listed Below (If Applicable).  If you need a refill on your cardiac medications before your next appointment, please call your pharmacy.

## 2021-08-03 DIAGNOSIS — I2510 Atherosclerotic heart disease of native coronary artery without angina pectoris: Secondary | ICD-10-CM | POA: Diagnosis not present

## 2021-08-03 DIAGNOSIS — F329 Major depressive disorder, single episode, unspecified: Secondary | ICD-10-CM | POA: Diagnosis not present

## 2021-08-03 DIAGNOSIS — I4891 Unspecified atrial fibrillation: Secondary | ICD-10-CM | POA: Diagnosis not present

## 2021-08-04 NOTE — Progress Notes (Signed)
Carelink Summary Report / Loop Recorder 

## 2021-08-05 DIAGNOSIS — Z23 Encounter for immunization: Secondary | ICD-10-CM | POA: Diagnosis not present

## 2021-08-14 ENCOUNTER — Ambulatory Visit (INDEPENDENT_AMBULATORY_CARE_PROVIDER_SITE_OTHER): Payer: Medicare Other

## 2021-08-14 DIAGNOSIS — I4819 Other persistent atrial fibrillation: Secondary | ICD-10-CM | POA: Diagnosis not present

## 2021-08-16 LAB — CUP PACEART REMOTE DEVICE CHECK
Date Time Interrogation Session: 20220909032558
Implantable Pulse Generator Implant Date: 20191015

## 2021-08-18 DIAGNOSIS — K754 Autoimmune hepatitis: Secondary | ICD-10-CM | POA: Diagnosis not present

## 2021-08-18 DIAGNOSIS — E782 Mixed hyperlipidemia: Secondary | ICD-10-CM | POA: Diagnosis not present

## 2021-08-18 DIAGNOSIS — Z23 Encounter for immunization: Secondary | ICD-10-CM | POA: Diagnosis not present

## 2021-08-18 DIAGNOSIS — N1831 Chronic kidney disease, stage 3a: Secondary | ICD-10-CM | POA: Diagnosis not present

## 2021-08-22 NOTE — Progress Notes (Signed)
Carelink Summary Report / Loop Recorder 

## 2021-09-02 DIAGNOSIS — F329 Major depressive disorder, single episode, unspecified: Secondary | ICD-10-CM | POA: Diagnosis not present

## 2021-09-02 DIAGNOSIS — I4891 Unspecified atrial fibrillation: Secondary | ICD-10-CM | POA: Diagnosis not present

## 2021-09-02 DIAGNOSIS — I2510 Atherosclerotic heart disease of native coronary artery without angina pectoris: Secondary | ICD-10-CM | POA: Diagnosis not present

## 2021-10-03 DIAGNOSIS — I4891 Unspecified atrial fibrillation: Secondary | ICD-10-CM | POA: Diagnosis not present

## 2021-10-03 DIAGNOSIS — I2510 Atherosclerotic heart disease of native coronary artery without angina pectoris: Secondary | ICD-10-CM | POA: Diagnosis not present

## 2021-10-03 DIAGNOSIS — F329 Major depressive disorder, single episode, unspecified: Secondary | ICD-10-CM | POA: Diagnosis not present

## 2021-10-16 DIAGNOSIS — H40013 Open angle with borderline findings, low risk, bilateral: Secondary | ICD-10-CM | POA: Diagnosis not present

## 2021-10-16 DIAGNOSIS — H0102A Squamous blepharitis right eye, upper and lower eyelids: Secondary | ICD-10-CM | POA: Diagnosis not present

## 2021-10-16 DIAGNOSIS — H0102B Squamous blepharitis left eye, upper and lower eyelids: Secondary | ICD-10-CM | POA: Diagnosis not present

## 2021-10-16 DIAGNOSIS — H53461 Homonymous bilateral field defects, right side: Secondary | ICD-10-CM | POA: Diagnosis not present

## 2021-10-16 DIAGNOSIS — Z961 Presence of intraocular lens: Secondary | ICD-10-CM | POA: Diagnosis not present

## 2021-10-17 DIAGNOSIS — K7460 Unspecified cirrhosis of liver: Secondary | ICD-10-CM | POA: Diagnosis not present

## 2021-10-17 DIAGNOSIS — K743 Primary biliary cirrhosis: Secondary | ICD-10-CM | POA: Diagnosis not present

## 2021-10-19 DIAGNOSIS — G4700 Insomnia, unspecified: Secondary | ICD-10-CM | POA: Diagnosis not present

## 2021-10-23 ENCOUNTER — Ambulatory Visit (INDEPENDENT_AMBULATORY_CARE_PROVIDER_SITE_OTHER): Payer: Medicare Other

## 2021-10-23 DIAGNOSIS — I480 Paroxysmal atrial fibrillation: Secondary | ICD-10-CM | POA: Diagnosis not present

## 2021-10-24 LAB — CUP PACEART REMOTE DEVICE CHECK
Date Time Interrogation Session: 20221114023039
Implantable Pulse Generator Implant Date: 20191015

## 2021-10-31 NOTE — Progress Notes (Signed)
Carelink Summary Report / Loop Recorder 

## 2021-11-02 DIAGNOSIS — F329 Major depressive disorder, single episode, unspecified: Secondary | ICD-10-CM | POA: Diagnosis not present

## 2021-11-02 DIAGNOSIS — I2510 Atherosclerotic heart disease of native coronary artery without angina pectoris: Secondary | ICD-10-CM | POA: Diagnosis not present

## 2021-11-02 DIAGNOSIS — I4891 Unspecified atrial fibrillation: Secondary | ICD-10-CM | POA: Diagnosis not present

## 2021-11-06 NOTE — Progress Notes (Signed)
Electrophysiology Office Note Date: 11/07/2021  ID:  Abigail Sullivan, Abigail Sullivan 11-14-1946, MRN [id number]  PCP: Marco Collie, MD Primary Cardiologist: None Electrophysiologist: Thompson Grayer, MD   CC: ILR follow-up  Abigail Sullivan is a 75 y.o. female seen today for Dr. Rayann Heman . she presents today for routine electrophysiology followup.  Since last being seen in our clinic, the patient reports doing very well. She does not notice that she is in AF, nor does she have palpitations. She has occasional "waves of cold" come over her, but she relaxes and waits for it to pass. Denies overt dizziness or lightheadedness. No syncope.  she denies chest pain,  dyspnea, PND, orthopnea, nausea, vomiting, dizziness, syncope, edema, weight gain, or early satiety.  Device History: Medtronic loop recorder implanted 09/2018 for  palpitations, AF  Past Medical History:  Diagnosis Date   Atrial fibrillation (HCC)    post-operative    Hepatitis    Hyperlipidemia    Hypertension    Premature atrial contractions    systomatic PAC's and documented SVT ( likley atrial tachycardia )   Stroke (cerebrum) ([place])    Past Surgical History:  Procedure Laterality Date   HEMORROIDECTOMY     LOOP RECORDER INSERTION N/A 09/16/2018   Procedure: LOOP RECORDER INSERTION;  Surgeon: Thompson Grayer, MD;  Location: [place] [facility];  Service: Cardiovascular;  Laterality: N/A;   LUMBAR LAMINECTOMY/DECOMPRESSION MICRODISCECTOMY  02/04/2011   L2-L3 and L3-L4 with diskectomy and plating   SHOULDER ARTHROSCOPY W/ ROTATOR CUFF REPAIR Left     Current Outpatient Medications  Medication Sig Dispense Refill   acetaminophen (TYLENOL) 500 MG tablet Take 1,500 mg by mouth daily as needed for moderate pain.      acyclovir (ZOVIRAX) 200 MG capsule Take 200 mg by mouth 2 (two) times daily.      apixaban (ELIQUIS) 5 MG TABS tablet Take 1 tablet (5 mg total) by mouth 2 (two) times daily. 180 tablet 3   BELSOMRA 20 MG TABS TAKE 1 TABLET BY MOUTH  AT BEDTIME 30 tablet 5   diltiazem (CARDIZEM CD) 180 MG 24 hr capsule Take 1 capsule (180 mg total) by mouth in the morning and at bedtime. 180 capsule 3   gabapentin (NEURONTIN) 300 MG capsule Take 300 mg by mouth 2 (two) times daily.     LORazepam (ATIVAN) 1 MG tablet Take 1 mg by mouth at bedtime.      losartan (COZAAR) 25 MG tablet Take 25 mg by mouth daily.     nitroGLYCERIN (NITROSTAT) 0.4 MG SL tablet Place 1 tablet under the tongue every 5 minutes. Max 3 doses. If no relief after 3 doses, call 911 25 tablet 3   oxyCODONE-acetaminophen (PERCOCET) 7.5-325 MG tablet Take 2 tablets by mouth 2 (two) times daily.     pregabalin (LYRICA) 50 MG capsule Take 1 capsule (50 mg total) by mouth 3 (three) times daily. 90 capsule 5   simvastatin (ZOCOR) 20 MG tablet Take 20 mg by mouth every evening.      ursodiol (ACTIGALL) 250 MG tablet Take 250 mg by mouth 3 (three) times daily.     ursodiol (ACTIGALL) 300 MG capsule Take 300 mg by mouth 2 (two) times daily.       DULoxetine (CYMBALTA) 30 MG capsule Take 1 capsule (30 mg total) by mouth daily. X 1 week then 60 mg daily- for nerve pain (Patient not taking: Reported on 11/07/2021) 60 capsule 5   levETIRAcetam (KEPPRA) 500 MG tablet Take 1 tablet (  500 mg total) by mouth 2 (two) times daily. X 1 week then 1000 mg 2x/day- for nerve pain- (Patient not taking: Reported on 11/07/2021) 120 tablet 5   No current facility-administered medications for this visit.    Allergies:   Lisinopril and Lipitor [atorvastatin]   Social History: Social History   Socioeconomic History   Marital status: Married    Spouse name: Ed   Number of children: Not on file   Years of education: 12   Highest education level: 12th grade  Occupational History   Occupation: retired  Tobacco Use   Smoking status: Former    Packs/day: 0.30    Types: Cigarettes    Quit date: 12/22/2016    Years since quitting: 4.8   Smokeless tobacco: Never   Tobacco comments:    smokes 3  cigarettes per day, not interested in quitting  Vaping Use   Vaping Use: Never used  Substance and Sexual Activity   Alcohol use: No   Drug use: No   Sexual activity: Not on file  Other Topics Concern   Not on file  Social History Narrative   Patient is right-handed.   Social Determinants of Health   Financial Resource Strain: Not on file  Food Insecurity: Not on file  Transportation Needs: Not on file  Physical Activity: Not on file  Stress: Not on file  Social Connections: Not on file  Intimate Partner Violence: Not on file    Family History: Family History  Problem Relation Age of Onset   Hypertension Mother    Other Father        CABGx4     Review of Systems: All other systems reviewed and are otherwise negative except as noted above.  Physical Exam: Vitals:   11/07/21 0937  BP: 128/80  Pulse: 69  SpO2: 99%  Weight: 118 lb 9.6 oz (53.8 kg)  Height: 5\' 3"  (1.6 m)     GEN- The patient is well appearing, alert and oriented x 3 today.   HEENT: normocephalic, atraumatic; sclera clear, conjunctiva pink; hearing intact; oropharynx clear; neck supple  Lungs- Clear to ausculation bilaterally, normal work of breathing.  No wheezes, rales, rhonchi Heart- Regular rate and rhythm, no murmurs, rubs or gallops  GI- soft, non-tender, non-distended, bowel sounds present  Extremities- no clubbing, cyanosis, or edema  MS- no significant deformity or atrophy Skin- warm and dry, no rash or lesion; PPM pocket well healed Psych- euthymic mood, full affect Neuro- strength and sensation are intact  PPM Interrogation- reviewed in detail today,  See PACEART report  EKG:  EKG is not ordered today. The ekg ordered 07/2021 shows AF in 100s.  Last NSR EKG  was 12/2018  Recent Labs: No results found for requested labs within last 8760 hours.   Wt Readings from Last 3 Encounters:  11/07/21 118 lb 9.6 oz (53.8 kg)  07/31/21 117 lb 6.4 oz (53.3 kg)  11/07/20 116 lb 9.6 oz (52.9 kg)      Other studies Reviewed: Additional studies/ records that were reviewed today include: Previous EP office notes, Previous remote checks, Most recent labwork.   Assessment and Plan:  Persistent afib Chads2vasc score is 5 on eliquis Her afib burden have increased; Burden 97% by check today.  Continue diltiazem CD 180mg  BID We could consider AAD therapy though not convinced that she is a good candidate. She has had issues with compliance and medication side effects. She has previously requested to come off all medications. Primary ablation could  also be considered. For now, she is feeling well and would like to continue rate control.  Once ILR reaches RRT, she would potentially want a new device placed depending on the plan for her AF. If rate control pursued, could likely not replace.    2. HTN Stable on current regimen    3. HL Per PCP continue Zocor   4. Tobacco Remains quit.  5. Intermittent "cold" feeling Check TSH. No fevers or rigor.   Current medicines are reviewed at length with the patient today.    Labs/ tests ordered today include:  Orders Placed This Encounter  Procedures   Basic metabolic panel   CBC   TSH    Disposition:   Follow up with Dr. Curt Bears in 6 Months at her request (Lives 7 miles from [place]) with Dr. Rayann Heman leaving.     Jacalyn Lefevre, PA-C  11/07/2021 10:06 AM  [facility] [address] [place] [address] [phone] (office) [phone] (fax)

## 2021-11-07 ENCOUNTER — Encounter: Payer: Self-pay | Admitting: Student

## 2021-11-07 ENCOUNTER — Other Ambulatory Visit: Payer: Self-pay

## 2021-11-07 ENCOUNTER — Ambulatory Visit (INDEPENDENT_AMBULATORY_CARE_PROVIDER_SITE_OTHER): Payer: Medicare Other | Admitting: Student

## 2021-11-07 VITALS — BP 128/80 | HR 69 | Ht 63.0 in | Wt 118.6 lb

## 2021-11-07 DIAGNOSIS — I480 Paroxysmal atrial fibrillation: Secondary | ICD-10-CM

## 2021-11-07 DIAGNOSIS — Z720 Tobacco use: Secondary | ICD-10-CM | POA: Diagnosis not present

## 2021-11-07 DIAGNOSIS — I10 Essential (primary) hypertension: Secondary | ICD-10-CM

## 2021-11-07 DIAGNOSIS — E785 Hyperlipidemia, unspecified: Secondary | ICD-10-CM

## 2021-11-07 LAB — BASIC METABOLIC PANEL
BUN/Creatinine Ratio: 22 (ref 12–28)
BUN: 20 mg/dL (ref 8–27)
CO2: 23 mmol/L (ref 20–29)
Calcium: 10.2 mg/dL (ref 8.7–10.3)
Chloride: 102 mmol/L (ref 96–106)
Creatinine, Ser: 0.91 mg/dL (ref 0.57–1.00)
Glucose: 101 mg/dL — ABNORMAL HIGH (ref 70–99)
Potassium: 5.2 mmol/L (ref 3.5–5.2)
Sodium: 139 mmol/L (ref 134–144)
eGFR: 66 mL/min/{1.73_m2} (ref >59)

## 2021-11-07 LAB — CBC
Hematocrit: 45.2 % (ref 34.0–46.6)
Hemoglobin: 14.9 g/dL (ref 11.1–15.9)
MCH: 29.9 pg (ref 26.6–33.0)
MCHC: 33 g/dL (ref 31.5–35.7)
MCV: 91 fL (ref 79–97)
Platelets: 358 10*3/uL (ref 150–450)
RBC: 4.99 x10E6/uL (ref 3.77–5.28)
RDW: 13.1 % (ref 11.7–15.4)
WBC: 7.4 10*3/uL (ref 3.4–10.8)

## 2021-11-07 LAB — CUP PACEART INCLINIC DEVICE CHECK
Date Time Interrogation Session: 20221206100833
Implantable Pulse Generator Implant Date: 20191015

## 2021-11-07 LAB — TSH: TSH: 1.44 u[IU]/mL (ref 0.450–4.500)

## 2021-11-07 NOTE — Patient Instructions (Signed)
Medication Instructions:  Your physician recommends that you continue on your current medications as directed. Please refer to the Current Medication list given to you today.  *If you need a refill on your cardiac medications before your next appointment, please call your pharmacy*   Lab Work: TODAY: BMET, CBC, TSH  If you have labs (blood work) drawn today and your tests are completely normal, you will receive your results only by: [place] (if you have MyChart) OR A paper copy in the mail If you have any lab test that is abnormal or we need to change your treatment, we will call you to review the results.   Follow-Up: At [facility], you and your health needs are our priority.  As part of our continuing mission to provide you with exceptional heart care, we have created designated Provider Care Teams.  These Care Teams include your primary Cardiologist (physician) and Advanced Practice Providers (APPs -  Physician Assistants and Nurse Practitioners) who all work together to provide you with the care you need, when you need it.  We recommend signing up for the patient portal called "MyChart".  Sign up information is provided on this After Visit Summary.  MyChart is used to connect with patients for Virtual Visits (Telemedicine).  Patients are able to view lab/test results, encounter notes, upcoming appointments, etc.  Non-urgent messages can be sent to your provider as well.   To learn more about what you can do with MyChart, go to [email or web address].    Your next appointment:   6 month(s)  The format for your next appointment:   In Person  Provider:   Allegra Lai, MD

## 2021-11-21 LAB — CUP PACEART REMOTE DEVICE CHECK
Date Time Interrogation Session: 20221217023612
Implantable Pulse Generator Implant Date: 20191015

## 2021-11-28 ENCOUNTER — Ambulatory Visit (INDEPENDENT_AMBULATORY_CARE_PROVIDER_SITE_OTHER): Payer: Medicare Other

## 2021-11-28 DIAGNOSIS — I480 Paroxysmal atrial fibrillation: Secondary | ICD-10-CM | POA: Diagnosis not present

## 2021-12-03 DIAGNOSIS — F329 Major depressive disorder, single episode, unspecified: Secondary | ICD-10-CM | POA: Diagnosis not present

## 2021-12-03 DIAGNOSIS — I4891 Unspecified atrial fibrillation: Secondary | ICD-10-CM | POA: Diagnosis not present

## 2021-12-03 DIAGNOSIS — I2510 Atherosclerotic heart disease of native coronary artery without angina pectoris: Secondary | ICD-10-CM | POA: Diagnosis not present

## 2021-12-06 DIAGNOSIS — G4700 Insomnia, unspecified: Secondary | ICD-10-CM | POA: Diagnosis not present

## 2021-12-07 NOTE — Progress Notes (Signed)
Carelink Summary Report / Loop Recorder 

## 2021-12-23 DIAGNOSIS — I2510 Atherosclerotic heart disease of native coronary artery without angina pectoris: Secondary | ICD-10-CM | POA: Diagnosis not present

## 2021-12-23 DIAGNOSIS — Z96642 Presence of left artificial hip joint: Secondary | ICD-10-CM | POA: Diagnosis not present

## 2021-12-23 DIAGNOSIS — Z79899 Other long term (current) drug therapy: Secondary | ICD-10-CM | POA: Diagnosis not present

## 2021-12-23 DIAGNOSIS — F319 Bipolar disorder, unspecified: Secondary | ICD-10-CM | POA: Diagnosis not present

## 2021-12-23 DIAGNOSIS — I10 Essential (primary) hypertension: Secondary | ICD-10-CM | POA: Diagnosis not present

## 2021-12-23 DIAGNOSIS — Z87891 Personal history of nicotine dependence: Secondary | ICD-10-CM | POA: Diagnosis not present

## 2021-12-23 DIAGNOSIS — S7002XA Contusion of left hip, initial encounter: Secondary | ICD-10-CM | POA: Diagnosis not present

## 2021-12-23 DIAGNOSIS — M25562 Pain in left knee: Secondary | ICD-10-CM | POA: Diagnosis not present

## 2021-12-23 DIAGNOSIS — F411 Generalized anxiety disorder: Secondary | ICD-10-CM | POA: Diagnosis not present

## 2021-12-23 DIAGNOSIS — E7800 Pure hypercholesterolemia, unspecified: Secondary | ICD-10-CM | POA: Diagnosis not present

## 2021-12-23 DIAGNOSIS — R296 Repeated falls: Secondary | ICD-10-CM | POA: Diagnosis not present

## 2021-12-23 DIAGNOSIS — M25552 Pain in left hip: Secondary | ICD-10-CM | POA: Diagnosis not present

## 2021-12-23 DIAGNOSIS — G4700 Insomnia, unspecified: Secondary | ICD-10-CM | POA: Diagnosis not present

## 2021-12-23 DIAGNOSIS — S50812A Abrasion of left forearm, initial encounter: Secondary | ICD-10-CM | POA: Diagnosis not present

## 2021-12-23 DIAGNOSIS — I252 Old myocardial infarction: Secondary | ICD-10-CM | POA: Diagnosis not present

## 2021-12-23 DIAGNOSIS — Z7901 Long term (current) use of anticoagulants: Secondary | ICD-10-CM | POA: Diagnosis not present

## 2021-12-23 DIAGNOSIS — M7989 Other specified soft tissue disorders: Secondary | ICD-10-CM | POA: Diagnosis not present

## 2021-12-23 DIAGNOSIS — Z743 Need for continuous supervision: Secondary | ICD-10-CM | POA: Diagnosis not present

## 2021-12-23 DIAGNOSIS — Z8679 Personal history of other diseases of the circulatory system: Secondary | ICD-10-CM | POA: Diagnosis not present

## 2021-12-23 DIAGNOSIS — W19XXXA Unspecified fall, initial encounter: Secondary | ICD-10-CM | POA: Diagnosis not present

## 2021-12-23 DIAGNOSIS — M7981 Nontraumatic hematoma of soft tissue: Secondary | ICD-10-CM | POA: Diagnosis not present

## 2021-12-23 DIAGNOSIS — R5381 Other malaise: Secondary | ICD-10-CM | POA: Diagnosis not present

## 2021-12-23 DIAGNOSIS — Z8673 Personal history of transient ischemic attack (TIA), and cerebral infarction without residual deficits: Secondary | ICD-10-CM | POA: Diagnosis not present

## 2021-12-23 DIAGNOSIS — M79605 Pain in left leg: Secondary | ICD-10-CM | POA: Diagnosis not present

## 2021-12-23 DIAGNOSIS — I4891 Unspecified atrial fibrillation: Secondary | ICD-10-CM | POA: Diagnosis not present

## 2021-12-23 DIAGNOSIS — M1990 Unspecified osteoarthritis, unspecified site: Secondary | ICD-10-CM | POA: Diagnosis not present

## 2021-12-26 DIAGNOSIS — Z96649 Presence of unspecified artificial hip joint: Secondary | ICD-10-CM | POA: Diagnosis not present

## 2022-01-01 ENCOUNTER — Ambulatory Visit (INDEPENDENT_AMBULATORY_CARE_PROVIDER_SITE_OTHER): Payer: Medicare Other

## 2022-01-01 DIAGNOSIS — I480 Paroxysmal atrial fibrillation: Secondary | ICD-10-CM | POA: Diagnosis not present

## 2022-01-01 DIAGNOSIS — I4891 Unspecified atrial fibrillation: Secondary | ICD-10-CM | POA: Diagnosis not present

## 2022-01-01 DIAGNOSIS — R2681 Unsteadiness on feet: Secondary | ICD-10-CM | POA: Diagnosis not present

## 2022-01-01 DIAGNOSIS — S7012XA Contusion of left thigh, initial encounter: Secondary | ICD-10-CM | POA: Diagnosis not present

## 2022-01-01 DIAGNOSIS — Z7689 Persons encountering health services in other specified circumstances: Secondary | ICD-10-CM | POA: Diagnosis not present

## 2022-01-01 DIAGNOSIS — Z23 Encounter for immunization: Secondary | ICD-10-CM | POA: Diagnosis not present

## 2022-01-01 DIAGNOSIS — S7002XA Contusion of left hip, initial encounter: Secondary | ICD-10-CM | POA: Diagnosis not present

## 2022-01-01 LAB — CUP PACEART REMOTE DEVICE CHECK
Date Time Interrogation Session: 20230129232359
Implantable Pulse Generator Implant Date: 20191015

## 2022-01-03 DIAGNOSIS — I4891 Unspecified atrial fibrillation: Secondary | ICD-10-CM | POA: Diagnosis not present

## 2022-01-03 DIAGNOSIS — I2510 Atherosclerotic heart disease of native coronary artery without angina pectoris: Secondary | ICD-10-CM | POA: Diagnosis not present

## 2022-01-03 DIAGNOSIS — F329 Major depressive disorder, single episode, unspecified: Secondary | ICD-10-CM | POA: Diagnosis not present

## 2022-01-08 DIAGNOSIS — E7800 Pure hypercholesterolemia, unspecified: Secondary | ICD-10-CM | POA: Diagnosis not present

## 2022-01-08 DIAGNOSIS — Z7901 Long term (current) use of anticoagulants: Secondary | ICD-10-CM | POA: Diagnosis not present

## 2022-01-08 DIAGNOSIS — N1831 Chronic kidney disease, stage 3a: Secondary | ICD-10-CM | POA: Diagnosis not present

## 2022-01-08 DIAGNOSIS — F3160 Bipolar disorder, current episode mixed, unspecified: Secondary | ICD-10-CM | POA: Diagnosis not present

## 2022-01-08 DIAGNOSIS — I129 Hypertensive chronic kidney disease with stage 1 through stage 4 chronic kidney disease, or unspecified chronic kidney disease: Secondary | ICD-10-CM | POA: Diagnosis not present

## 2022-01-08 DIAGNOSIS — I700 Atherosclerosis of aorta: Secondary | ICD-10-CM | POA: Diagnosis not present

## 2022-01-08 DIAGNOSIS — M9963 Osseous and subluxation stenosis of intervertebral foramina of lumbar region: Secondary | ICD-10-CM | POA: Diagnosis not present

## 2022-01-08 DIAGNOSIS — G4700 Insomnia, unspecified: Secondary | ICD-10-CM | POA: Diagnosis not present

## 2022-01-08 DIAGNOSIS — I252 Old myocardial infarction: Secondary | ICD-10-CM | POA: Diagnosis not present

## 2022-01-08 DIAGNOSIS — M1990 Unspecified osteoarthritis, unspecified site: Secondary | ICD-10-CM | POA: Diagnosis not present

## 2022-01-08 DIAGNOSIS — Z8673 Personal history of transient ischemic attack (TIA), and cerebral infarction without residual deficits: Secondary | ICD-10-CM | POA: Diagnosis not present

## 2022-01-08 DIAGNOSIS — D6869 Other thrombophilia: Secondary | ICD-10-CM | POA: Diagnosis not present

## 2022-01-08 DIAGNOSIS — F411 Generalized anxiety disorder: Secondary | ICD-10-CM | POA: Diagnosis not present

## 2022-01-08 DIAGNOSIS — I2510 Atherosclerotic heart disease of native coronary artery without angina pectoris: Secondary | ICD-10-CM | POA: Diagnosis not present

## 2022-01-08 DIAGNOSIS — K745 Biliary cirrhosis, unspecified: Secondary | ICD-10-CM | POA: Diagnosis not present

## 2022-01-08 DIAGNOSIS — K754 Autoimmune hepatitis: Secondary | ICD-10-CM | POA: Diagnosis not present

## 2022-01-08 DIAGNOSIS — K7290 Hepatic failure, unspecified without coma: Secondary | ICD-10-CM | POA: Diagnosis not present

## 2022-01-08 DIAGNOSIS — Z96642 Presence of left artificial hip joint: Secondary | ICD-10-CM | POA: Diagnosis not present

## 2022-01-08 DIAGNOSIS — E46 Unspecified protein-calorie malnutrition: Secondary | ICD-10-CM | POA: Diagnosis not present

## 2022-01-08 DIAGNOSIS — S7012XD Contusion of left thigh, subsequent encounter: Secondary | ICD-10-CM | POA: Diagnosis not present

## 2022-01-08 DIAGNOSIS — G609 Hereditary and idiopathic neuropathy, unspecified: Secondary | ICD-10-CM | POA: Diagnosis not present

## 2022-01-08 DIAGNOSIS — I4891 Unspecified atrial fibrillation: Secondary | ICD-10-CM | POA: Diagnosis not present

## 2022-01-08 DIAGNOSIS — I739 Peripheral vascular disease, unspecified: Secondary | ICD-10-CM | POA: Diagnosis not present

## 2022-01-08 DIAGNOSIS — S7002XD Contusion of left hip, subsequent encounter: Secondary | ICD-10-CM | POA: Diagnosis not present

## 2022-01-08 DIAGNOSIS — H832X9 Labyrinthine dysfunction, unspecified ear: Secondary | ICD-10-CM | POA: Diagnosis not present

## 2022-01-09 NOTE — Progress Notes (Signed)
Carelink Summary Report / Loop Recorder 

## 2022-01-12 DIAGNOSIS — S7002XD Contusion of left hip, subsequent encounter: Secondary | ICD-10-CM | POA: Diagnosis not present

## 2022-01-12 DIAGNOSIS — F411 Generalized anxiety disorder: Secondary | ICD-10-CM | POA: Diagnosis not present

## 2022-01-12 DIAGNOSIS — Z96642 Presence of left artificial hip joint: Secondary | ICD-10-CM | POA: Diagnosis not present

## 2022-01-12 DIAGNOSIS — I4891 Unspecified atrial fibrillation: Secondary | ICD-10-CM | POA: Diagnosis not present

## 2022-01-12 DIAGNOSIS — M9963 Osseous and subluxation stenosis of intervertebral foramina of lumbar region: Secondary | ICD-10-CM | POA: Diagnosis not present

## 2022-01-12 DIAGNOSIS — Z8673 Personal history of transient ischemic attack (TIA), and cerebral infarction without residual deficits: Secondary | ICD-10-CM | POA: Diagnosis not present

## 2022-01-16 DIAGNOSIS — S7002XD Contusion of left hip, subsequent encounter: Secondary | ICD-10-CM | POA: Diagnosis not present

## 2022-01-16 DIAGNOSIS — Z96642 Presence of left artificial hip joint: Secondary | ICD-10-CM | POA: Diagnosis not present

## 2022-01-16 DIAGNOSIS — Z8673 Personal history of transient ischemic attack (TIA), and cerebral infarction without residual deficits: Secondary | ICD-10-CM | POA: Diagnosis not present

## 2022-01-16 DIAGNOSIS — M9963 Osseous and subluxation stenosis of intervertebral foramina of lumbar region: Secondary | ICD-10-CM | POA: Diagnosis not present

## 2022-01-16 DIAGNOSIS — I4891 Unspecified atrial fibrillation: Secondary | ICD-10-CM | POA: Diagnosis not present

## 2022-01-16 DIAGNOSIS — F411 Generalized anxiety disorder: Secondary | ICD-10-CM | POA: Diagnosis not present

## 2022-01-23 DIAGNOSIS — S7002XD Contusion of left hip, subsequent encounter: Secondary | ICD-10-CM | POA: Diagnosis not present

## 2022-01-23 DIAGNOSIS — M9963 Osseous and subluxation stenosis of intervertebral foramina of lumbar region: Secondary | ICD-10-CM | POA: Diagnosis not present

## 2022-01-23 DIAGNOSIS — Z8673 Personal history of transient ischemic attack (TIA), and cerebral infarction without residual deficits: Secondary | ICD-10-CM | POA: Diagnosis not present

## 2022-01-23 DIAGNOSIS — F411 Generalized anxiety disorder: Secondary | ICD-10-CM | POA: Diagnosis not present

## 2022-01-23 DIAGNOSIS — Z96642 Presence of left artificial hip joint: Secondary | ICD-10-CM | POA: Diagnosis not present

## 2022-01-23 DIAGNOSIS — I4891 Unspecified atrial fibrillation: Secondary | ICD-10-CM | POA: Diagnosis not present

## 2022-01-26 DIAGNOSIS — Z8673 Personal history of transient ischemic attack (TIA), and cerebral infarction without residual deficits: Secondary | ICD-10-CM | POA: Diagnosis not present

## 2022-01-26 DIAGNOSIS — M9963 Osseous and subluxation stenosis of intervertebral foramina of lumbar region: Secondary | ICD-10-CM | POA: Diagnosis not present

## 2022-01-26 DIAGNOSIS — I4891 Unspecified atrial fibrillation: Secondary | ICD-10-CM | POA: Diagnosis not present

## 2022-01-26 DIAGNOSIS — F411 Generalized anxiety disorder: Secondary | ICD-10-CM | POA: Diagnosis not present

## 2022-01-26 DIAGNOSIS — Z96642 Presence of left artificial hip joint: Secondary | ICD-10-CM | POA: Diagnosis not present

## 2022-01-26 DIAGNOSIS — S7002XD Contusion of left hip, subsequent encounter: Secondary | ICD-10-CM | POA: Diagnosis not present

## 2022-01-30 DIAGNOSIS — I4891 Unspecified atrial fibrillation: Secondary | ICD-10-CM | POA: Diagnosis not present

## 2022-01-30 DIAGNOSIS — Z96642 Presence of left artificial hip joint: Secondary | ICD-10-CM | POA: Diagnosis not present

## 2022-01-30 DIAGNOSIS — S7002XD Contusion of left hip, subsequent encounter: Secondary | ICD-10-CM | POA: Diagnosis not present

## 2022-01-30 DIAGNOSIS — M9963 Osseous and subluxation stenosis of intervertebral foramina of lumbar region: Secondary | ICD-10-CM | POA: Diagnosis not present

## 2022-01-30 DIAGNOSIS — F411 Generalized anxiety disorder: Secondary | ICD-10-CM | POA: Diagnosis not present

## 2022-01-30 DIAGNOSIS — Z8673 Personal history of transient ischemic attack (TIA), and cerebral infarction without residual deficits: Secondary | ICD-10-CM | POA: Diagnosis not present

## 2022-01-31 DIAGNOSIS — I4891 Unspecified atrial fibrillation: Secondary | ICD-10-CM | POA: Diagnosis not present

## 2022-01-31 DIAGNOSIS — F329 Major depressive disorder, single episode, unspecified: Secondary | ICD-10-CM | POA: Diagnosis not present

## 2022-01-31 DIAGNOSIS — I2510 Atherosclerotic heart disease of native coronary artery without angina pectoris: Secondary | ICD-10-CM | POA: Diagnosis not present

## 2022-02-02 DIAGNOSIS — S7002XD Contusion of left hip, subsequent encounter: Secondary | ICD-10-CM | POA: Diagnosis not present

## 2022-02-02 DIAGNOSIS — Z96642 Presence of left artificial hip joint: Secondary | ICD-10-CM | POA: Diagnosis not present

## 2022-02-02 DIAGNOSIS — Z8673 Personal history of transient ischemic attack (TIA), and cerebral infarction without residual deficits: Secondary | ICD-10-CM | POA: Diagnosis not present

## 2022-02-02 DIAGNOSIS — M9963 Osseous and subluxation stenosis of intervertebral foramina of lumbar region: Secondary | ICD-10-CM | POA: Diagnosis not present

## 2022-02-02 DIAGNOSIS — F411 Generalized anxiety disorder: Secondary | ICD-10-CM | POA: Diagnosis not present

## 2022-02-02 DIAGNOSIS — I4891 Unspecified atrial fibrillation: Secondary | ICD-10-CM | POA: Diagnosis not present

## 2022-02-05 ENCOUNTER — Ambulatory Visit (INDEPENDENT_AMBULATORY_CARE_PROVIDER_SITE_OTHER): Payer: Medicare Other

## 2022-02-05 DIAGNOSIS — I480 Paroxysmal atrial fibrillation: Secondary | ICD-10-CM | POA: Diagnosis not present

## 2022-02-05 LAB — CUP PACEART REMOTE DEVICE CHECK
Date Time Interrogation Session: 20230303232735
Implantable Pulse Generator Implant Date: 20191015

## 2022-02-06 DIAGNOSIS — Z96642 Presence of left artificial hip joint: Secondary | ICD-10-CM | POA: Diagnosis not present

## 2022-02-06 DIAGNOSIS — M9963 Osseous and subluxation stenosis of intervertebral foramina of lumbar region: Secondary | ICD-10-CM | POA: Diagnosis not present

## 2022-02-06 DIAGNOSIS — Z8673 Personal history of transient ischemic attack (TIA), and cerebral infarction without residual deficits: Secondary | ICD-10-CM | POA: Diagnosis not present

## 2022-02-06 DIAGNOSIS — I4891 Unspecified atrial fibrillation: Secondary | ICD-10-CM | POA: Diagnosis not present

## 2022-02-06 DIAGNOSIS — F411 Generalized anxiety disorder: Secondary | ICD-10-CM | POA: Diagnosis not present

## 2022-02-06 DIAGNOSIS — S7002XD Contusion of left hip, subsequent encounter: Secondary | ICD-10-CM | POA: Diagnosis not present

## 2022-02-07 DIAGNOSIS — G609 Hereditary and idiopathic neuropathy, unspecified: Secondary | ICD-10-CM | POA: Diagnosis not present

## 2022-02-07 DIAGNOSIS — N1831 Chronic kidney disease, stage 3a: Secondary | ICD-10-CM | POA: Diagnosis not present

## 2022-02-07 DIAGNOSIS — F3160 Bipolar disorder, current episode mixed, unspecified: Secondary | ICD-10-CM | POA: Diagnosis not present

## 2022-02-07 DIAGNOSIS — F411 Generalized anxiety disorder: Secondary | ICD-10-CM | POA: Diagnosis not present

## 2022-02-07 DIAGNOSIS — S7002XD Contusion of left hip, subsequent encounter: Secondary | ICD-10-CM | POA: Diagnosis not present

## 2022-02-07 DIAGNOSIS — I700 Atherosclerosis of aorta: Secondary | ICD-10-CM | POA: Diagnosis not present

## 2022-02-07 DIAGNOSIS — K7290 Hepatic failure, unspecified without coma: Secondary | ICD-10-CM | POA: Diagnosis not present

## 2022-02-07 DIAGNOSIS — G4700 Insomnia, unspecified: Secondary | ICD-10-CM | POA: Diagnosis not present

## 2022-02-07 DIAGNOSIS — I2510 Atherosclerotic heart disease of native coronary artery without angina pectoris: Secondary | ICD-10-CM | POA: Diagnosis not present

## 2022-02-07 DIAGNOSIS — I129 Hypertensive chronic kidney disease with stage 1 through stage 4 chronic kidney disease, or unspecified chronic kidney disease: Secondary | ICD-10-CM | POA: Diagnosis not present

## 2022-02-07 DIAGNOSIS — Z7901 Long term (current) use of anticoagulants: Secondary | ICD-10-CM | POA: Diagnosis not present

## 2022-02-07 DIAGNOSIS — Z8673 Personal history of transient ischemic attack (TIA), and cerebral infarction without residual deficits: Secondary | ICD-10-CM | POA: Diagnosis not present

## 2022-02-07 DIAGNOSIS — E46 Unspecified protein-calorie malnutrition: Secondary | ICD-10-CM | POA: Diagnosis not present

## 2022-02-07 DIAGNOSIS — M9963 Osseous and subluxation stenosis of intervertebral foramina of lumbar region: Secondary | ICD-10-CM | POA: Diagnosis not present

## 2022-02-07 DIAGNOSIS — K745 Biliary cirrhosis, unspecified: Secondary | ICD-10-CM | POA: Diagnosis not present

## 2022-02-07 DIAGNOSIS — I4891 Unspecified atrial fibrillation: Secondary | ICD-10-CM | POA: Diagnosis not present

## 2022-02-07 DIAGNOSIS — D6869 Other thrombophilia: Secondary | ICD-10-CM | POA: Diagnosis not present

## 2022-02-07 DIAGNOSIS — Z96642 Presence of left artificial hip joint: Secondary | ICD-10-CM | POA: Diagnosis not present

## 2022-02-07 DIAGNOSIS — I252 Old myocardial infarction: Secondary | ICD-10-CM | POA: Diagnosis not present

## 2022-02-07 DIAGNOSIS — E7800 Pure hypercholesterolemia, unspecified: Secondary | ICD-10-CM | POA: Diagnosis not present

## 2022-02-07 DIAGNOSIS — I739 Peripheral vascular disease, unspecified: Secondary | ICD-10-CM | POA: Diagnosis not present

## 2022-02-07 DIAGNOSIS — M1990 Unspecified osteoarthritis, unspecified site: Secondary | ICD-10-CM | POA: Diagnosis not present

## 2022-02-07 DIAGNOSIS — H832X9 Labyrinthine dysfunction, unspecified ear: Secondary | ICD-10-CM | POA: Diagnosis not present

## 2022-02-07 DIAGNOSIS — S7012XD Contusion of left thigh, subsequent encounter: Secondary | ICD-10-CM | POA: Diagnosis not present

## 2022-02-07 DIAGNOSIS — K754 Autoimmune hepatitis: Secondary | ICD-10-CM | POA: Diagnosis not present

## 2022-02-09 DIAGNOSIS — Z96642 Presence of left artificial hip joint: Secondary | ICD-10-CM | POA: Diagnosis not present

## 2022-02-09 DIAGNOSIS — S7002XD Contusion of left hip, subsequent encounter: Secondary | ICD-10-CM | POA: Diagnosis not present

## 2022-02-09 DIAGNOSIS — F411 Generalized anxiety disorder: Secondary | ICD-10-CM | POA: Diagnosis not present

## 2022-02-09 DIAGNOSIS — M9963 Osseous and subluxation stenosis of intervertebral foramina of lumbar region: Secondary | ICD-10-CM | POA: Diagnosis not present

## 2022-02-09 DIAGNOSIS — I4891 Unspecified atrial fibrillation: Secondary | ICD-10-CM | POA: Diagnosis not present

## 2022-02-09 DIAGNOSIS — Z8673 Personal history of transient ischemic attack (TIA), and cerebral infarction without residual deficits: Secondary | ICD-10-CM | POA: Diagnosis not present

## 2022-02-14 DIAGNOSIS — S7002XD Contusion of left hip, subsequent encounter: Secondary | ICD-10-CM | POA: Diagnosis not present

## 2022-02-14 DIAGNOSIS — Z96642 Presence of left artificial hip joint: Secondary | ICD-10-CM | POA: Diagnosis not present

## 2022-02-14 DIAGNOSIS — F411 Generalized anxiety disorder: Secondary | ICD-10-CM | POA: Diagnosis not present

## 2022-02-14 DIAGNOSIS — I4891 Unspecified atrial fibrillation: Secondary | ICD-10-CM | POA: Diagnosis not present

## 2022-02-14 DIAGNOSIS — M9963 Osseous and subluxation stenosis of intervertebral foramina of lumbar region: Secondary | ICD-10-CM | POA: Diagnosis not present

## 2022-02-14 DIAGNOSIS — Z8673 Personal history of transient ischemic attack (TIA), and cerebral infarction without residual deficits: Secondary | ICD-10-CM | POA: Diagnosis not present

## 2022-02-15 NOTE — Progress Notes (Signed)
Carelink Summary Report / Loop Recorder 

## 2022-02-19 ENCOUNTER — Encounter: Payer: Self-pay | Admitting: Podiatry

## 2022-02-19 ENCOUNTER — Ambulatory Visit (INDEPENDENT_AMBULATORY_CARE_PROVIDER_SITE_OTHER): Payer: Medicare Other | Admitting: Podiatry

## 2022-02-19 ENCOUNTER — Other Ambulatory Visit: Payer: Self-pay

## 2022-02-19 DIAGNOSIS — B351 Tinea unguium: Secondary | ICD-10-CM | POA: Diagnosis not present

## 2022-02-19 DIAGNOSIS — M2041 Other hammer toe(s) (acquired), right foot: Secondary | ICD-10-CM | POA: Diagnosis not present

## 2022-02-19 DIAGNOSIS — M79675 Pain in left toe(s): Secondary | ICD-10-CM

## 2022-02-19 DIAGNOSIS — M79674 Pain in right toe(s): Secondary | ICD-10-CM | POA: Diagnosis not present

## 2022-02-19 DIAGNOSIS — D689 Coagulation defect, unspecified: Secondary | ICD-10-CM

## 2022-02-19 DIAGNOSIS — M2042 Other hammer toe(s) (acquired), left foot: Secondary | ICD-10-CM | POA: Diagnosis not present

## 2022-02-19 NOTE — Progress Notes (Signed)
?  Subjective:  ?Patient ID: Abigail Sullivan, female    DOB: July 10, 1946,   MRN: [id number] ? ?Chief Complaint  ?Patient presents with  ? Toe Pain  ?   NP/  left foot hallux pain   ? ? ?76 y.o. female presents for concern of bilateral hammertoes as well as concern for thickened elongated and painful toenails she is unable to trim herself. Relates she is on eliquis. Requesting nail trim. She also relates painful hammertoes that have been present for years and gets rubbing mostly on the two great toes.  . Denies any other pedal complaints. Denies n/v/f/c.  ? ?Past Medical History:  ?Diagnosis Date  ? Atrial fibrillation ([place])   ? post-operative   ? Hepatitis   ? Hyperlipidemia   ? Hypertension   ? Premature atrial contractions   ? systomatic PAC's and documented SVT ( likley atrial tachycardia )  ? Stroke (cerebrum) [facility])   ? ? ?Objective:  ?Physical Exam: ?Vascular: DP/PT pulses 2/4 bilateral. CFT <3 seconds. Normal hair growth on digits. No edema.  ?Skin. No lacerations or abrasions bilateral feet. Erythema over IPJ of bilateral hallux.  ?Musculoskeletal: MMT 5/5 bilateral lower extremities in DF, PF, Inversion and Eversion. Deceased ROM in DF of ankle joint.  Hammered digits 1-5 bilateral. Tender over first toes bilateral. Right hallux is rigid. Other hammertoes are semi-flexible.  ?Neurological: Sensation intact to light touch.  ? ?Assessment:  ? ?1. Hammertoes of both feet   ?2. Pain due to onychomycosis of toenails of both feet   ?3. Coagulation defect ([place])   ? ? ? ?Plan:  ?Patient was evaluated and treated and all questions answered. ?-Discussed and educated patient on  foot care, especially with  ?regards to the vascular, neurological and musculoskeletal systems.  ?-Discussed supportive shoes at all times and checking feet regularly.  ?-Mechanically debrided all nails 1-5 bilateral using sterile nail nipper and filed with dremel without incident ?-Discussed hammertoes and possible treatments. Will try padding first  to offload the areas of pain. If this does not relief pain discussed further surgical options.   ?-Answered all patient questions ?-Patient to return  in 3 months for at risk foot care ?-Patient advised to call the office if any problems or questions arise in the meantime. ? ? ?Lorenda Peck, DPM  ? ? ?

## 2022-02-21 DIAGNOSIS — F411 Generalized anxiety disorder: Secondary | ICD-10-CM | POA: Diagnosis not present

## 2022-02-21 DIAGNOSIS — Z8673 Personal history of transient ischemic attack (TIA), and cerebral infarction without residual deficits: Secondary | ICD-10-CM | POA: Diagnosis not present

## 2022-02-21 DIAGNOSIS — M9963 Osseous and subluxation stenosis of intervertebral foramina of lumbar region: Secondary | ICD-10-CM | POA: Diagnosis not present

## 2022-02-21 DIAGNOSIS — I4891 Unspecified atrial fibrillation: Secondary | ICD-10-CM | POA: Diagnosis not present

## 2022-02-21 DIAGNOSIS — Z96642 Presence of left artificial hip joint: Secondary | ICD-10-CM | POA: Diagnosis not present

## 2022-02-21 DIAGNOSIS — S7002XD Contusion of left hip, subsequent encounter: Secondary | ICD-10-CM | POA: Diagnosis not present

## 2022-03-03 DIAGNOSIS — E440 Moderate protein-calorie malnutrition: Secondary | ICD-10-CM | POA: Diagnosis not present

## 2022-03-03 DIAGNOSIS — K754 Autoimmune hepatitis: Secondary | ICD-10-CM | POA: Diagnosis not present

## 2022-03-07 DIAGNOSIS — G4700 Insomnia, unspecified: Secondary | ICD-10-CM | POA: Diagnosis not present

## 2022-03-12 ENCOUNTER — Ambulatory Visit (INDEPENDENT_AMBULATORY_CARE_PROVIDER_SITE_OTHER): Payer: Medicare Other

## 2022-03-12 DIAGNOSIS — I480 Paroxysmal atrial fibrillation: Secondary | ICD-10-CM | POA: Diagnosis not present

## 2022-03-13 LAB — CUP PACEART REMOTE DEVICE CHECK
Date Time Interrogation Session: 20230406003743
Implantable Pulse Generator Implant Date: 20191015

## 2022-03-21 ENCOUNTER — Telehealth: Payer: Self-pay

## 2022-03-21 NOTE — Telephone Encounter (Signed)
LINQ alert received. ?Device has reached RRT 4/18 ?Known 100% AF, controlled rates, Eliquis, Diltiazem ?Route to triage ?LA ? ?Successful telephone encounter to patient to discuss loop at RRT. Patient is advised to unplug bedside monitor. Address confirmed and return kit sent. All future remotes cancelled in EMR, patient marked inactive in paceart and discontinued in carelink. Patient would like to have loop removed. She is due for a recall with Dr. Curt Bears in June. She is provided [place] clinic contact and will call to schedule 6 months follow up today. She will discuss explant at that time.  ? ? ? ? ? ?

## 2022-03-27 NOTE — Progress Notes (Signed)
Carelink Summary Report / Loop Recorder 

## 2022-04-02 DIAGNOSIS — E440 Moderate protein-calorie malnutrition: Secondary | ICD-10-CM | POA: Diagnosis not present

## 2022-04-02 DIAGNOSIS — K754 Autoimmune hepatitis: Secondary | ICD-10-CM | POA: Diagnosis not present

## 2022-04-09 DIAGNOSIS — H53461 Homonymous bilateral field defects, right side: Secondary | ICD-10-CM | POA: Diagnosis not present

## 2022-04-09 DIAGNOSIS — Z961 Presence of intraocular lens: Secondary | ICD-10-CM | POA: Diagnosis not present

## 2022-04-09 DIAGNOSIS — H04123 Dry eye syndrome of bilateral lacrimal glands: Secondary | ICD-10-CM | POA: Diagnosis not present

## 2022-04-09 DIAGNOSIS — H40013 Open angle with borderline findings, low risk, bilateral: Secondary | ICD-10-CM | POA: Diagnosis not present

## 2022-04-09 DIAGNOSIS — H43811 Vitreous degeneration, right eye: Secondary | ICD-10-CM | POA: Diagnosis not present

## 2022-04-09 DIAGNOSIS — H0102B Squamous blepharitis left eye, upper and lower eyelids: Secondary | ICD-10-CM | POA: Diagnosis not present

## 2022-04-09 DIAGNOSIS — H0102A Squamous blepharitis right eye, upper and lower eyelids: Secondary | ICD-10-CM | POA: Diagnosis not present

## 2022-04-23 DIAGNOSIS — K754 Autoimmune hepatitis: Secondary | ICD-10-CM | POA: Diagnosis not present

## 2022-04-23 DIAGNOSIS — E782 Mixed hyperlipidemia: Secondary | ICD-10-CM | POA: Diagnosis not present

## 2022-05-01 DIAGNOSIS — N390 Urinary tract infection, site not specified: Secondary | ICD-10-CM | POA: Diagnosis not present

## 2022-05-01 DIAGNOSIS — F3160 Bipolar disorder, current episode mixed, unspecified: Secondary | ICD-10-CM | POA: Diagnosis not present

## 2022-05-01 DIAGNOSIS — K754 Autoimmune hepatitis: Secondary | ICD-10-CM | POA: Diagnosis not present

## 2022-05-01 DIAGNOSIS — R112 Nausea with vomiting, unspecified: Secondary | ICD-10-CM | POA: Diagnosis not present

## 2022-05-03 DIAGNOSIS — E440 Moderate protein-calorie malnutrition: Secondary | ICD-10-CM | POA: Diagnosis not present

## 2022-05-03 DIAGNOSIS — K754 Autoimmune hepatitis: Secondary | ICD-10-CM | POA: Diagnosis not present

## 2022-05-04 ENCOUNTER — Other Ambulatory Visit: Payer: Self-pay | Admitting: Family Medicine

## 2022-05-04 DIAGNOSIS — Z1231 Encounter for screening mammogram for malignant neoplasm of breast: Secondary | ICD-10-CM

## 2022-05-09 DIAGNOSIS — Z6821 Body mass index (BMI) 21.0-21.9, adult: Secondary | ICD-10-CM | POA: Diagnosis not present

## 2022-05-09 DIAGNOSIS — R350 Frequency of micturition: Secondary | ICD-10-CM | POA: Diagnosis not present

## 2022-05-21 ENCOUNTER — Encounter: Payer: Self-pay | Admitting: Podiatry

## 2022-05-21 ENCOUNTER — Ambulatory Visit (INDEPENDENT_AMBULATORY_CARE_PROVIDER_SITE_OTHER): Payer: Medicare Other | Admitting: Podiatry

## 2022-05-21 DIAGNOSIS — F1120 Opioid dependence, uncomplicated: Secondary | ICD-10-CM | POA: Insufficient documentation

## 2022-05-21 DIAGNOSIS — Z981 Arthrodesis status: Secondary | ICD-10-CM | POA: Insufficient documentation

## 2022-05-21 DIAGNOSIS — S2249XA Multiple fractures of ribs, unspecified side, initial encounter for closed fracture: Secondary | ICD-10-CM | POA: Insufficient documentation

## 2022-05-21 DIAGNOSIS — B351 Tinea unguium: Secondary | ICD-10-CM

## 2022-05-21 DIAGNOSIS — M79675 Pain in left toe(s): Secondary | ICD-10-CM | POA: Diagnosis not present

## 2022-05-21 DIAGNOSIS — M79674 Pain in right toe(s): Secondary | ICD-10-CM | POA: Diagnosis not present

## 2022-05-21 DIAGNOSIS — M2042 Other hammer toe(s) (acquired), left foot: Secondary | ICD-10-CM

## 2022-05-21 DIAGNOSIS — D689 Coagulation defect, unspecified: Secondary | ICD-10-CM

## 2022-05-21 DIAGNOSIS — M2041 Other hammer toe(s) (acquired), right foot: Secondary | ICD-10-CM

## 2022-05-21 NOTE — Progress Notes (Signed)
  Subjective:  Patient ID: Abigail Sullivan, female    DOB: 1946-02-21,   MRN: [id number]  Chief Complaint  Patient presents with   Nail Problem    Trim nails    Foot Pain    The big toe are doing better and the pads do help     76 y.o. female presents for concern for thickened elongated and painful toenails she is unable to trim herself. Relates she is on eliquis. Requesting nail trim. She also relates painful hammertoes that have been present for years and gets rubbing mostly on the two great toes. Padding is helping  . Denies any other pedal complaints. Denies n/v/f/c.   Past Medical History:  Diagnosis Date   Atrial fibrillation (HCC)    post-operative    Hepatitis    Hyperlipidemia    Hypertension    Premature atrial contractions    systomatic PAC's and documented SVT ( likley atrial tachycardia )   Stroke (cerebrum) (HCC)     Objective:  Physical Exam: Vascular: DP/PT pulses 2/4 bilateral. CFT <3 seconds. Normal hair growth on digits. No edema.  Skin. No lacerations or abrasions bilateral feet. Erythema over IPJ of bilateral hallux.  Musculoskeletal: MMT 5/5 bilateral lower extremities in DF, PF, Inversion and Eversion. Deceased ROM in DF of ankle joint.  Hammered digits 1-5 bilateral. Tender over first toes bilateral. Right hallux is rigid. Other hammertoes are semi-flexible.  Neurological: Sensation intact to light touch.   Assessment:   1. Pain due to onychomycosis of toenails of both feet   2. Hammertoes of both feet   3. Coagulation defect ([place])       Plan:  Patient was evaluated and treated and all questions answered. -Discussed and educated patient on  foot care, especially with  regards to the vascular, neurological and musculoskeletal systems.  -Discussed supportive shoes at all times and checking feet regularly.  -Mechanically debrided all nails 1-5 bilateral using sterile nail nipper and filed with dremel without incident -Discussed hammertoes and possible  treatments. Continue with padding.  -Answered all patient questions -Patient to return  in 3 months for at risk foot care -Patient advised to call the office if any problems or questions arise in the meantime.   Lorenda Peck, DPM

## 2022-06-02 DIAGNOSIS — E440 Moderate protein-calorie malnutrition: Secondary | ICD-10-CM | POA: Diagnosis not present

## 2022-06-02 DIAGNOSIS — K754 Autoimmune hepatitis: Secondary | ICD-10-CM | POA: Diagnosis not present

## 2022-06-04 ENCOUNTER — Ambulatory Visit (INDEPENDENT_AMBULATORY_CARE_PROVIDER_SITE_OTHER): Payer: Medicare Other | Admitting: Cardiology

## 2022-06-04 ENCOUNTER — Encounter: Payer: Self-pay | Admitting: Cardiology

## 2022-06-04 VITALS — BP 114/70 | HR 102 | Ht 63.0 in | Wt 123.6 lb

## 2022-06-04 DIAGNOSIS — I4819 Other persistent atrial fibrillation: Secondary | ICD-10-CM

## 2022-06-04 DIAGNOSIS — D6869 Other thrombophilia: Secondary | ICD-10-CM

## 2022-06-04 NOTE — Progress Notes (Signed)
Electrophysiology Office Note   Date:  06/04/2022   ID:  Dabney, Dever September 28, 1946, MRN [id number]  PCP:  Marco Collie, MD  Cardiologist:   Primary Electrophysiologist:  Comfort Iversen Meredith Leeds, MD    Chief Complaint: AF   History of Present Illness: Abigail Sullivan is a 76 y.o. female who is being seen today for the evaluation of AF at the request of Marco Collie, MD. Presenting today for electrophysiology evaluation.  She has a history of PACs, CVA, atrial fibrillation, hypertension, hyperlipidemia.  She has a Medtronic Linq implant for atrial fibrillation monitoring.  Linq monitor battery has reached end of service.  She initially wanted the device explanted but does not wish to come to [facility] for the procedure.  Today, she denies symptoms of palpitations, chest pain, shortness of breath, orthopnea, PND, lower extremity edema, claudication, dizziness, presyncope, syncope, bleeding, or neurologic sequela. The patient is tolerating medications without difficulties.    Past Medical History:  Diagnosis Date   Atrial fibrillation ([place])    post-operative    Hepatitis    Hyperlipidemia    Hypertension    Premature atrial contractions    systomatic PAC's and documented SVT ( likley atrial tachycardia )   Stroke (cerebrum) ([place])    Past Surgical History:  Procedure Laterality Date   HEMORROIDECTOMY     LOOP RECORDER INSERTION N/A 09/16/2018   Procedure: LOOP RECORDER INSERTION;  Surgeon: Thompson Grayer, MD;  Location: [place] [facility];  Service: Cardiovascular;  Laterality: N/A;   LUMBAR LAMINECTOMY/DECOMPRESSION MICRODISCECTOMY  02/04/2011   L2-L3 and L3-L4 with diskectomy and plating   SHOULDER ARTHROSCOPY W/ ROTATOR CUFF REPAIR Left      Current Outpatient Medications  Medication Sig Dispense Refill   acetaminophen (TYLENOL) 500 MG tablet Take 1,500 mg by mouth daily as needed for moderate pain.      acyclovir (ZOVIRAX) 200 MG capsule Take 200 mg by mouth 2 (two) times  daily.      apixaban (ELIQUIS) 5 MG TABS tablet Take 1 tablet (5 mg total) by mouth 2 (two) times daily. 180 tablet 3   BELSOMRA 20 MG TABS TAKE 1 TABLET BY MOUTH AT BEDTIME 30 tablet 5   diltiazem (CARDIZEM CD) 180 MG 24 hr capsule Take 1 capsule (180 mg total) by mouth in the morning and at bedtime. 180 capsule 3   gabapentin (NEURONTIN) 300 MG capsule Take 300 mg by mouth 2 (two) times daily.     LORazepam (ATIVAN) 1 MG tablet Take 1 mg by mouth at bedtime.      losartan (COZAAR) 25 MG tablet Take 25 mg by mouth daily.     oxyCODONE-acetaminophen (PERCOCET) 7.5-325 MG tablet Take 2 tablets by mouth 2 (two) times daily.     simvastatin (ZOCOR) 20 MG tablet Take 20 mg by mouth every evening.      SYSTANE ULTRA 0.4-0.3 % SOLN SMARTSIG:1 In Eye(s) PRN     ursodiol (ACTIGALL) 250 MG tablet Take 250 mg by mouth 3 (three) times daily.     HYDROcodone-acetaminophen (NORCO) 10-325 MG tablet Take by mouth. (Patient not taking: Reported on 06/04/2022)     nitroGLYCERIN (NITROSTAT) 0.4 MG SL tablet Place 1 tablet under the tongue every 5 minutes. Max 3 doses. If no relief after 3 doses, call 911 (Patient not taking: Reported on 06/04/2022) 25 tablet 3   No current facility-administered medications for this visit.    Allergies:   Lisinopril and Lipitor [atorvastatin]   Social History:  The patient  reports that she quit smoking about 5 years ago. Her smoking use included cigarettes. She smoked an average of .3 packs per day. She has never used smokeless tobacco. She reports that she does not drink alcohol and does not use drugs.   Family History:  The patient's family history includes Hypertension in her mother; Other in her father.    ROS:  Please see the history of present illness.   Otherwise, review of systems is positive for none.   All other systems are reviewed and negative.    PHYSICAL EXAM: VS:  BP 114/70   Pulse (!) 102   Ht '5\' 3"'$  (1.6 m)   Wt 123 lb 9.6 oz (56.1 kg)   SpO2 99%   BMI  21.89 kg/m  , BMI Body mass index is 21.89 kg/m. GEN: Well nourished, well developed, in no acute distress  HEENT: normal  Neck: no JVD, carotid bruits, or masses Cardiac: irregular; no murmurs, rubs, or gallops,no edema  Respiratory:  clear to auscultation bilaterally, normal work of breathing GI: soft, nontender, nondistended, + BS MS: no deformity or atrophy  Skin: warm and dry, device pocket is well healed Neuro:  Strength and sensation are intact Psych: euthymic mood, full affect  EKG:  EKG is ordered today. Personal review of the ekg ordered shows atrial fibrillation   Recent Labs: 11/07/2021: BUN 20; Creatinine, Ser 0.91; Hemoglobin 14.9; Platelets 358; Potassium 5.2; Sodium 139; TSH 1.440    Lipid Panel     Component Value Date/Time   CHOL 151 09/10/2018 1031   TRIG 124 09/10/2018 1031   HDL 62 09/10/2018 1031   CHOLHDL 2.4 09/10/2018 1031   LDLCALC 64 09/10/2018 1031     Wt Readings from Last 3 Encounters:  06/04/22 123 lb 9.6 oz (56.1 kg)  11/07/21 118 lb 9.6 oz (53.8 kg)  07/31/21 117 lb 6.4 oz (53.3 kg)      Other studies Reviewed: Additional studies/ records that were reviewed today include: epic notes  ASSESSMENT AND PLAN:  1.  Persistent atrial fibrillation: CHA2DS2-VASc of 5.  Currently on Eliquis 5 mg twice daily.  She remains in atrial fibrillation with an approximate 100% burden.  She is likely permanent at this point.  She is overall feeling well.  No changes.  2.  Hypertension: Currently well controlled  3.  Hyperlipidemia: Continue Zocor per PCP  4.  Secondary hypercoagulable state: Currently on Eliquis for atrial fibrillation as above  Current medicines are reviewed at length with the patient today.   The patient does not have concerns regarding her medicines.  The following changes were made today:  none  Labs/ tests ordered today include:  Orders Placed This Encounter  Procedures   EKG 12-Lead     Disposition:   FU with Icarus Partch 1 year  Signed, Brindley Madarang Meredith Leeds, MD  06/04/2022 2:44 PM     [facility] [address] [place] [address] [phone] (office) [phone] (fax)

## 2022-06-09 DIAGNOSIS — M7989 Other specified soft tissue disorders: Secondary | ICD-10-CM | POA: Diagnosis not present

## 2022-06-09 DIAGNOSIS — Z79899 Other long term (current) drug therapy: Secondary | ICD-10-CM | POA: Diagnosis not present

## 2022-06-09 DIAGNOSIS — S52591A Other fractures of lower end of right radius, initial encounter for closed fracture: Secondary | ICD-10-CM | POA: Diagnosis not present

## 2022-06-12 ENCOUNTER — Telehealth: Payer: Self-pay | Admitting: Cardiology

## 2022-06-12 ENCOUNTER — Telehealth: Payer: Self-pay | Admitting: *Deleted

## 2022-06-12 DIAGNOSIS — S52501A Unspecified fracture of the lower end of right radius, initial encounter for closed fracture: Secondary | ICD-10-CM | POA: Diagnosis not present

## 2022-06-12 DIAGNOSIS — S52601A Unspecified fracture of lower end of right ulna, initial encounter for closed fracture: Secondary | ICD-10-CM | POA: Diagnosis not present

## 2022-06-12 NOTE — Telephone Encounter (Signed)
   Pre-operative Risk Assessment    Patient Name: Abigail Sullivan  DOB: 1946-03-01 MRN: [id number]      Request for Surgical Clearance    Procedure:   ORIF RIGHT DISTAL RADIUS  Date of Surgery:  Clearance TBD                                 Surgeon:  DR. Joya Salm Surgeon's Group or Practice Name:  [place] Phone number:  [phone] Fax number:  [phone] ATTN: CASSIE   Type of Clearance Requested:   - Medical  - Pharmacy:  Hold Apixaban (Eliquis)     Type of Anesthesia:  General    Additional requests/questions:    [name]   06/12/2022, 1:22 PM

## 2022-06-12 NOTE — Telephone Encounter (Signed)
See encounter Clearance TBD

## 2022-06-12 NOTE — Telephone Encounter (Signed)
   Pre-operative Risk Assessment    Patient Name: Abigail Sullivan  DOB: 04-27-1946 MRN: [id number]      Request for Surgical Clearance    Procedure:   Right wrist surgery  Date of Surgery:  Clearance 06/15/22                                 Surgeon:    Dr. Joya Salm Surgeon's Group or Practice Name:  [facility] Phone number:  [phone] Fax number:  [phone]   Type of Clearance Requested:   - Medical    Type of Anesthesia:  General    Additional requests/questions:    Caller would like to hold patient's Eliquis for this surgery.  Caller will also be faxing a clearance form.   Signed, Heloise Beecham   06/12/2022, 11:18 AM

## 2022-06-12 NOTE — Telephone Encounter (Signed)
Dr. Curt Bears, you saw this patient on 06/04/22 who is now pending ORIF right distal radius. Can you comment on cardiac clearance and route your response to p cv div preop.  Additionally, request is being forwarded to Clinical Pharm D.  Thank you, Emmaline Life, NP-C    06/12/2022, 3:27 PM [place] [address] Office [phone] Fax [phone]

## 2022-06-12 NOTE — Telephone Encounter (Signed)
Patient with diagnosis of afib on Eliquis for anticoagulation.    Procedure: ORIF RIGHT DISTAL RADIUS Date of procedure: TBD   CHA2DS2-VASc Score = 7   This indicates a 11.2% annual risk of stroke. The patient's score is based upon: CHF History: 0 HTN History: 1 Diabetes History: 0 Stroke History: 2 Vascular Disease History: 1 Age Score: 2 Gender Score: 1      CrCl 43 ml/min  ACC/AHA does not recommend routine bridging with lovenox for DOAC.   Per office protocol, patient can hold Eliquis for 2 days prior to procedure.   Patient should resume Eliquis as soon as safely possible after surgery.  **This guidance is not considered finalized until pre-operative APP has relayed final recommendations.**

## 2022-06-13 NOTE — Telephone Encounter (Signed)
   Primary Cardiologist: None  Chart reviewed as part of pre-operative protocol coverage. Given past medical history and time since last visit, based on ACC/AHA guidelines for Abigail Sullivan, per Dr. Curt Bears who recently evaluated the patient, she is at intermediate risk for MACE for an intermediate risk surgery. She may proceed without further testing.   Per office protocol, patient can hold Eliquis for 2 days prior to procedure.   Patient should resume Eliquis as soon as safely possible after surgery. Per ACC/AHA guidelines, there is no recommendation for bridging with lovenox for DOAC.  I will route this recommendation to the requesting party via Epic fax function and remove from pre-op pool.  Please call with questions.  Emmaline Life, NP-C    06/13/2022, 8:36 AM [place], [address] Office [phone] Fax [phone]

## 2022-06-15 DIAGNOSIS — G8918 Other acute postprocedural pain: Secondary | ICD-10-CM | POA: Diagnosis not present

## 2022-06-15 DIAGNOSIS — K219 Gastro-esophageal reflux disease without esophagitis: Secondary | ICD-10-CM | POA: Diagnosis not present

## 2022-06-15 DIAGNOSIS — I10 Essential (primary) hypertension: Secondary | ICD-10-CM | POA: Diagnosis not present

## 2022-06-15 DIAGNOSIS — S52501A Unspecified fracture of the lower end of right radius, initial encounter for closed fracture: Secondary | ICD-10-CM | POA: Diagnosis not present

## 2022-06-15 DIAGNOSIS — S52551A Other extraarticular fracture of lower end of right radius, initial encounter for closed fracture: Secondary | ICD-10-CM | POA: Diagnosis not present

## 2022-06-15 DIAGNOSIS — Z8673 Personal history of transient ischemic attack (TIA), and cerebral infarction without residual deficits: Secondary | ICD-10-CM | POA: Diagnosis not present

## 2022-06-27 DIAGNOSIS — S52501A Unspecified fracture of the lower end of right radius, initial encounter for closed fracture: Secondary | ICD-10-CM | POA: Diagnosis not present

## 2022-06-27 DIAGNOSIS — S52601A Unspecified fracture of lower end of right ulna, initial encounter for closed fracture: Secondary | ICD-10-CM | POA: Diagnosis not present

## 2022-07-03 DIAGNOSIS — E440 Moderate protein-calorie malnutrition: Secondary | ICD-10-CM | POA: Diagnosis not present

## 2022-07-03 DIAGNOSIS — K754 Autoimmune hepatitis: Secondary | ICD-10-CM | POA: Diagnosis not present

## 2022-07-18 DIAGNOSIS — G4700 Insomnia, unspecified: Secondary | ICD-10-CM | POA: Diagnosis not present

## 2022-07-24 DIAGNOSIS — M4126 Other idiopathic scoliosis, lumbar region: Secondary | ICD-10-CM | POA: Diagnosis not present

## 2022-07-24 DIAGNOSIS — M48061 Spinal stenosis, lumbar region without neurogenic claudication: Secondary | ICD-10-CM | POA: Diagnosis not present

## 2022-07-24 DIAGNOSIS — M545 Low back pain, unspecified: Secondary | ICD-10-CM | POA: Diagnosis not present

## 2022-07-24 DIAGNOSIS — G894 Chronic pain syndrome: Secondary | ICD-10-CM | POA: Diagnosis not present

## 2022-07-24 DIAGNOSIS — M4316 Spondylolisthesis, lumbar region: Secondary | ICD-10-CM | POA: Diagnosis not present

## 2022-07-24 DIAGNOSIS — I129 Hypertensive chronic kidney disease with stage 1 through stage 4 chronic kidney disease, or unspecified chronic kidney disease: Secondary | ICD-10-CM | POA: Diagnosis not present

## 2022-07-24 DIAGNOSIS — I4891 Unspecified atrial fibrillation: Secondary | ICD-10-CM | POA: Diagnosis not present

## 2022-07-24 DIAGNOSIS — M549 Dorsalgia, unspecified: Secondary | ICD-10-CM | POA: Diagnosis not present

## 2022-07-24 DIAGNOSIS — M5416 Radiculopathy, lumbar region: Secondary | ICD-10-CM | POA: Diagnosis not present

## 2022-07-25 ENCOUNTER — Other Ambulatory Visit: Payer: Self-pay | Admitting: Internal Medicine

## 2022-07-25 DIAGNOSIS — M25551 Pain in right hip: Secondary | ICD-10-CM | POA: Diagnosis not present

## 2022-07-25 DIAGNOSIS — M5416 Radiculopathy, lumbar region: Secondary | ICD-10-CM | POA: Diagnosis not present

## 2022-07-25 DIAGNOSIS — Z87440 Personal history of urinary (tract) infections: Secondary | ICD-10-CM | POA: Diagnosis not present

## 2022-07-25 DIAGNOSIS — Z87891 Personal history of nicotine dependence: Secondary | ICD-10-CM | POA: Diagnosis not present

## 2022-07-25 DIAGNOSIS — I2510 Atherosclerotic heart disease of native coronary artery without angina pectoris: Secondary | ICD-10-CM | POA: Diagnosis not present

## 2022-07-25 DIAGNOSIS — M1990 Unspecified osteoarthritis, unspecified site: Secondary | ICD-10-CM | POA: Diagnosis not present

## 2022-07-25 DIAGNOSIS — Z87442 Personal history of urinary calculi: Secondary | ICD-10-CM | POA: Diagnosis not present

## 2022-07-25 DIAGNOSIS — M4126 Other idiopathic scoliosis, lumbar region: Secondary | ICD-10-CM | POA: Diagnosis not present

## 2022-07-25 DIAGNOSIS — M4316 Spondylolisthesis, lumbar region: Secondary | ICD-10-CM | POA: Diagnosis not present

## 2022-07-25 DIAGNOSIS — N189 Chronic kidney disease, unspecified: Secondary | ICD-10-CM | POA: Diagnosis not present

## 2022-07-25 DIAGNOSIS — Z7952 Long term (current) use of systemic steroids: Secondary | ICD-10-CM | POA: Diagnosis not present

## 2022-07-25 DIAGNOSIS — M47816 Spondylosis without myelopathy or radiculopathy, lumbar region: Secondary | ICD-10-CM | POA: Diagnosis not present

## 2022-07-25 DIAGNOSIS — M79652 Pain in left thigh: Secondary | ICD-10-CM | POA: Diagnosis not present

## 2022-07-25 DIAGNOSIS — Z8673 Personal history of transient ischemic attack (TIA), and cerebral infarction without residual deficits: Secondary | ICD-10-CM | POA: Diagnosis not present

## 2022-07-25 DIAGNOSIS — M545 Low back pain, unspecified: Secondary | ICD-10-CM | POA: Diagnosis not present

## 2022-07-25 DIAGNOSIS — Z96642 Presence of left artificial hip joint: Secondary | ICD-10-CM | POA: Diagnosis not present

## 2022-07-25 DIAGNOSIS — Z79899 Other long term (current) drug therapy: Secondary | ICD-10-CM | POA: Diagnosis not present

## 2022-07-25 DIAGNOSIS — Z79891 Long term (current) use of opiate analgesic: Secondary | ICD-10-CM | POA: Diagnosis not present

## 2022-07-25 DIAGNOSIS — M549 Dorsalgia, unspecified: Secondary | ICD-10-CM | POA: Diagnosis not present

## 2022-07-25 DIAGNOSIS — R2689 Other abnormalities of gait and mobility: Secondary | ICD-10-CM | POA: Diagnosis not present

## 2022-07-25 DIAGNOSIS — Z7901 Long term (current) use of anticoagulants: Secondary | ICD-10-CM | POA: Diagnosis not present

## 2022-07-25 DIAGNOSIS — I129 Hypertensive chronic kidney disease with stage 1 through stage 4 chronic kidney disease, or unspecified chronic kidney disease: Secondary | ICD-10-CM | POA: Diagnosis not present

## 2022-07-25 DIAGNOSIS — I252 Old myocardial infarction: Secondary | ICD-10-CM | POA: Diagnosis not present

## 2022-07-25 DIAGNOSIS — G894 Chronic pain syndrome: Secondary | ICD-10-CM | POA: Diagnosis not present

## 2022-07-25 DIAGNOSIS — Z471 Aftercare following joint replacement surgery: Secondary | ICD-10-CM | POA: Diagnosis not present

## 2022-07-25 DIAGNOSIS — M47817 Spondylosis without myelopathy or radiculopathy, lumbosacral region: Secondary | ICD-10-CM | POA: Diagnosis not present

## 2022-07-25 DIAGNOSIS — M48061 Spinal stenosis, lumbar region without neurogenic claudication: Secondary | ICD-10-CM | POA: Diagnosis not present

## 2022-07-25 DIAGNOSIS — I4891 Unspecified atrial fibrillation: Secondary | ICD-10-CM | POA: Diagnosis present

## 2022-07-25 DIAGNOSIS — E7800 Pure hypercholesterolemia, unspecified: Secondary | ICD-10-CM | POA: Diagnosis not present

## 2022-07-25 DIAGNOSIS — F319 Bipolar disorder, unspecified: Secondary | ICD-10-CM | POA: Diagnosis present

## 2022-07-27 DIAGNOSIS — G609 Hereditary and idiopathic neuropathy, unspecified: Secondary | ICD-10-CM | POA: Diagnosis not present

## 2022-07-27 DIAGNOSIS — I4891 Unspecified atrial fibrillation: Secondary | ICD-10-CM | POA: Diagnosis not present

## 2022-07-27 DIAGNOSIS — I2510 Atherosclerotic heart disease of native coronary artery without angina pectoris: Secondary | ICD-10-CM | POA: Diagnosis not present

## 2022-07-27 DIAGNOSIS — M1990 Unspecified osteoarthritis, unspecified site: Secondary | ICD-10-CM | POA: Diagnosis not present

## 2022-07-27 DIAGNOSIS — I129 Hypertensive chronic kidney disease with stage 1 through stage 4 chronic kidney disease, or unspecified chronic kidney disease: Secondary | ICD-10-CM | POA: Diagnosis not present

## 2022-07-27 DIAGNOSIS — G4700 Insomnia, unspecified: Secondary | ICD-10-CM | POA: Diagnosis not present

## 2022-07-27 DIAGNOSIS — N1831 Chronic kidney disease, stage 3a: Secondary | ICD-10-CM | POA: Diagnosis not present

## 2022-07-27 DIAGNOSIS — I739 Peripheral vascular disease, unspecified: Secondary | ICD-10-CM | POA: Diagnosis not present

## 2022-07-27 DIAGNOSIS — M5430 Sciatica, unspecified side: Secondary | ICD-10-CM | POA: Diagnosis not present

## 2022-07-27 DIAGNOSIS — G894 Chronic pain syndrome: Secondary | ICD-10-CM | POA: Diagnosis not present

## 2022-07-27 DIAGNOSIS — Z8673 Personal history of transient ischemic attack (TIA), and cerebral infarction without residual deficits: Secondary | ICD-10-CM | POA: Diagnosis not present

## 2022-07-27 DIAGNOSIS — H832X9 Labyrinthine dysfunction, unspecified ear: Secondary | ICD-10-CM | POA: Diagnosis not present

## 2022-07-27 DIAGNOSIS — D6869 Other thrombophilia: Secondary | ICD-10-CM | POA: Diagnosis not present

## 2022-07-27 DIAGNOSIS — I700 Atherosclerosis of aorta: Secondary | ICD-10-CM | POA: Diagnosis not present

## 2022-07-27 DIAGNOSIS — I252 Old myocardial infarction: Secondary | ICD-10-CM | POA: Diagnosis not present

## 2022-07-27 DIAGNOSIS — Z7901 Long term (current) use of anticoagulants: Secondary | ICD-10-CM | POA: Diagnosis not present

## 2022-07-27 DIAGNOSIS — K760 Fatty (change of) liver, not elsewhere classified: Secondary | ICD-10-CM | POA: Diagnosis not present

## 2022-07-27 DIAGNOSIS — K754 Autoimmune hepatitis: Secondary | ICD-10-CM | POA: Diagnosis not present

## 2022-07-27 DIAGNOSIS — K745 Biliary cirrhosis, unspecified: Secondary | ICD-10-CM | POA: Diagnosis not present

## 2022-07-27 DIAGNOSIS — F3160 Bipolar disorder, current episode mixed, unspecified: Secondary | ICD-10-CM | POA: Diagnosis not present

## 2022-07-27 DIAGNOSIS — F411 Generalized anxiety disorder: Secondary | ICD-10-CM | POA: Diagnosis not present

## 2022-07-27 DIAGNOSIS — M5416 Radiculopathy, lumbar region: Secondary | ICD-10-CM | POA: Diagnosis not present

## 2022-07-27 DIAGNOSIS — E46 Unspecified protein-calorie malnutrition: Secondary | ICD-10-CM | POA: Diagnosis not present

## 2022-07-27 DIAGNOSIS — K7290 Hepatic failure, unspecified without coma: Secondary | ICD-10-CM | POA: Diagnosis not present

## 2022-07-27 DIAGNOSIS — E7800 Pure hypercholesterolemia, unspecified: Secondary | ICD-10-CM | POA: Diagnosis not present

## 2022-07-30 DIAGNOSIS — I2510 Atherosclerotic heart disease of native coronary artery without angina pectoris: Secondary | ICD-10-CM | POA: Diagnosis not present

## 2022-07-30 DIAGNOSIS — N1831 Chronic kidney disease, stage 3a: Secondary | ICD-10-CM | POA: Diagnosis not present

## 2022-07-30 DIAGNOSIS — M5416 Radiculopathy, lumbar region: Secondary | ICD-10-CM | POA: Diagnosis not present

## 2022-07-30 DIAGNOSIS — I129 Hypertensive chronic kidney disease with stage 1 through stage 4 chronic kidney disease, or unspecified chronic kidney disease: Secondary | ICD-10-CM | POA: Diagnosis not present

## 2022-07-30 DIAGNOSIS — M5430 Sciatica, unspecified side: Secondary | ICD-10-CM | POA: Diagnosis not present

## 2022-07-30 DIAGNOSIS — G894 Chronic pain syndrome: Secondary | ICD-10-CM | POA: Diagnosis not present

## 2022-07-31 DIAGNOSIS — E782 Mixed hyperlipidemia: Secondary | ICD-10-CM | POA: Diagnosis not present

## 2022-07-31 DIAGNOSIS — G609 Hereditary and idiopathic neuropathy, unspecified: Secondary | ICD-10-CM | POA: Diagnosis not present

## 2022-07-31 DIAGNOSIS — Z6820 Body mass index (BMI) 20.0-20.9, adult: Secondary | ICD-10-CM | POA: Diagnosis not present

## 2022-07-31 DIAGNOSIS — Z131 Encounter for screening for diabetes mellitus: Secondary | ICD-10-CM | POA: Diagnosis not present

## 2022-07-31 DIAGNOSIS — M5459 Other low back pain: Secondary | ICD-10-CM | POA: Diagnosis not present

## 2022-08-03 DIAGNOSIS — E440 Moderate protein-calorie malnutrition: Secondary | ICD-10-CM | POA: Diagnosis not present

## 2022-08-03 DIAGNOSIS — G894 Chronic pain syndrome: Secondary | ICD-10-CM | POA: Diagnosis not present

## 2022-08-03 DIAGNOSIS — K754 Autoimmune hepatitis: Secondary | ICD-10-CM | POA: Diagnosis not present

## 2022-08-03 DIAGNOSIS — M5416 Radiculopathy, lumbar region: Secondary | ICD-10-CM | POA: Diagnosis not present

## 2022-08-03 DIAGNOSIS — N1831 Chronic kidney disease, stage 3a: Secondary | ICD-10-CM | POA: Diagnosis not present

## 2022-08-03 DIAGNOSIS — M5430 Sciatica, unspecified side: Secondary | ICD-10-CM | POA: Diagnosis not present

## 2022-08-03 DIAGNOSIS — I129 Hypertensive chronic kidney disease with stage 1 through stage 4 chronic kidney disease, or unspecified chronic kidney disease: Secondary | ICD-10-CM | POA: Diagnosis not present

## 2022-08-03 DIAGNOSIS — I2510 Atherosclerotic heart disease of native coronary artery without angina pectoris: Secondary | ICD-10-CM | POA: Diagnosis not present

## 2022-08-08 DIAGNOSIS — S52601A Unspecified fracture of lower end of right ulna, initial encounter for closed fracture: Secondary | ICD-10-CM | POA: Diagnosis not present

## 2022-08-08 DIAGNOSIS — S52501A Unspecified fracture of the lower end of right radius, initial encounter for closed fracture: Secondary | ICD-10-CM | POA: Diagnosis not present

## 2022-08-10 ENCOUNTER — Other Ambulatory Visit: Payer: Self-pay | Admitting: Internal Medicine

## 2022-08-10 NOTE — Telephone Encounter (Signed)
Prescription refill request for Eliquis received. Indication:Afib Last office visit:7/23 Scr:0.9 Age: 76 Weight:56.1 kg  Prescription refilled

## 2022-08-20 ENCOUNTER — Ambulatory Visit: Payer: Medicare Other | Admitting: Podiatry

## 2022-08-20 ENCOUNTER — Ambulatory Visit (INDEPENDENT_AMBULATORY_CARE_PROVIDER_SITE_OTHER): Payer: Medicare Other | Admitting: Podiatry

## 2022-08-20 DIAGNOSIS — D689 Coagulation defect, unspecified: Secondary | ICD-10-CM | POA: Diagnosis not present

## 2022-08-20 DIAGNOSIS — M79674 Pain in right toe(s): Secondary | ICD-10-CM | POA: Diagnosis not present

## 2022-08-20 DIAGNOSIS — M2041 Other hammer toe(s) (acquired), right foot: Secondary | ICD-10-CM

## 2022-08-20 DIAGNOSIS — B351 Tinea unguium: Secondary | ICD-10-CM

## 2022-08-20 DIAGNOSIS — M2042 Other hammer toe(s) (acquired), left foot: Secondary | ICD-10-CM

## 2022-08-20 DIAGNOSIS — M79675 Pain in left toe(s): Secondary | ICD-10-CM

## 2022-08-20 NOTE — Progress Notes (Signed)
  Subjective:  Patient ID: Abigail Sullivan, female    DOB: 12/19/1945,  MRN: [id number]  Chief Complaint  Patient presents with   Foot Problem    Routine foot care     76 y.o. female presents with the above complaint. History confirmed with patient. Patient presents for thickened elongated dystrophic nails that are causing her pain. Nails growing into tip of toe. Also has known hammertoe contractures both feet but they do not cause pain. Unable to trim her nails herself due to prior strokes that have limited her mobility. Does have a history of taking Eliquis for afib long term.   Objective:  Physical Exam: warm, good capillary refill, nail exam onychomycosis of the toenails and dystrophic nails, no trophic changes or ulcerative lesions. DP pulses palpable, PT pulses palpable, and protective sensation intact Left Foot: normal exam, no swelling, tenderness, instability; ligaments intact, full range of motion of all ankle/foot joints hammertoe of all digits.  Right Foot: normal exam, no swelling, tenderness, instability; ligaments intact, full range of motion of all ankle/foot joints. Hammertoe of all digits.   No images are attached to the encounter.  Assessment:   1. Pain due to onychomycosis of toenails of both feet   2. Hammertoes of both feet   3. Coagulation defect [facility])      Plan:  Patient was evaluated and treated and all questions answered.  Onychomycosis with pain  -Nails palliatively debrided as below. -Educated on self-care  Procedure: Nail Debridement Rationale: Pain Type of Debridement: manual, sharp debridement. Instrumentation: Nail nipper, rotary burr. Number of Nails: 10  # Hammertoe rigid contracture all digits bilaterally - Continue use of gel toe cap PRN for great toe  Return in about 3 months (around 11/19/2022) for RFC.         Everitt Amber, DPM [facility] [place] / [facility]

## 2022-08-29 DIAGNOSIS — Z23 Encounter for immunization: Secondary | ICD-10-CM | POA: Diagnosis not present

## 2022-09-02 DIAGNOSIS — E440 Moderate protein-calorie malnutrition: Secondary | ICD-10-CM | POA: Diagnosis not present

## 2022-09-02 DIAGNOSIS — K754 Autoimmune hepatitis: Secondary | ICD-10-CM | POA: Diagnosis not present

## 2022-09-10 DIAGNOSIS — S52501A Unspecified fracture of the lower end of right radius, initial encounter for closed fracture: Secondary | ICD-10-CM | POA: Diagnosis not present

## 2022-09-10 DIAGNOSIS — S52601A Unspecified fracture of lower end of right ulna, initial encounter for closed fracture: Secondary | ICD-10-CM | POA: Diagnosis not present

## 2022-10-03 DIAGNOSIS — K754 Autoimmune hepatitis: Secondary | ICD-10-CM | POA: Diagnosis not present

## 2022-10-03 DIAGNOSIS — E440 Moderate protein-calorie malnutrition: Secondary | ICD-10-CM | POA: Diagnosis not present

## 2022-10-09 DIAGNOSIS — M25552 Pain in left hip: Secondary | ICD-10-CM | POA: Diagnosis not present

## 2022-10-09 DIAGNOSIS — Z6820 Body mass index (BMI) 20.0-20.9, adult: Secondary | ICD-10-CM | POA: Diagnosis not present

## 2022-10-09 DIAGNOSIS — M47816 Spondylosis without myelopathy or radiculopathy, lumbar region: Secondary | ICD-10-CM | POA: Diagnosis not present

## 2022-10-15 DIAGNOSIS — Z961 Presence of intraocular lens: Secondary | ICD-10-CM | POA: Diagnosis not present

## 2022-10-15 DIAGNOSIS — H0102B Squamous blepharitis left eye, upper and lower eyelids: Secondary | ICD-10-CM | POA: Diagnosis not present

## 2022-10-15 DIAGNOSIS — H40013 Open angle with borderline findings, low risk, bilateral: Secondary | ICD-10-CM | POA: Diagnosis not present

## 2022-10-15 DIAGNOSIS — H0102A Squamous blepharitis right eye, upper and lower eyelids: Secondary | ICD-10-CM | POA: Diagnosis not present

## 2022-10-15 DIAGNOSIS — H53461 Homonymous bilateral field defects, right side: Secondary | ICD-10-CM | POA: Diagnosis not present

## 2022-10-15 DIAGNOSIS — H04123 Dry eye syndrome of bilateral lacrimal glands: Secondary | ICD-10-CM | POA: Diagnosis not present

## 2022-11-02 DIAGNOSIS — I10 Essential (primary) hypertension: Secondary | ICD-10-CM | POA: Diagnosis not present

## 2022-11-02 DIAGNOSIS — K745 Biliary cirrhosis, unspecified: Secondary | ICD-10-CM | POA: Diagnosis not present

## 2022-11-09 DIAGNOSIS — E2839 Other primary ovarian failure: Secondary | ICD-10-CM | POA: Diagnosis not present

## 2022-11-09 DIAGNOSIS — Z1231 Encounter for screening mammogram for malignant neoplasm of breast: Secondary | ICD-10-CM | POA: Diagnosis not present

## 2022-11-09 DIAGNOSIS — M810 Age-related osteoporosis without current pathological fracture: Secondary | ICD-10-CM | POA: Diagnosis not present

## 2022-11-20 ENCOUNTER — Ambulatory Visit (INDEPENDENT_AMBULATORY_CARE_PROVIDER_SITE_OTHER): Payer: Medicare Other | Admitting: Podiatry

## 2022-11-20 DIAGNOSIS — M2042 Other hammer toe(s) (acquired), left foot: Secondary | ICD-10-CM

## 2022-11-20 DIAGNOSIS — M79675 Pain in left toe(s): Secondary | ICD-10-CM | POA: Diagnosis not present

## 2022-11-20 DIAGNOSIS — M79674 Pain in right toe(s): Secondary | ICD-10-CM | POA: Diagnosis not present

## 2022-11-20 DIAGNOSIS — B351 Tinea unguium: Secondary | ICD-10-CM

## 2022-11-20 DIAGNOSIS — M2041 Other hammer toe(s) (acquired), right foot: Secondary | ICD-10-CM

## 2022-11-20 DIAGNOSIS — D689 Coagulation defect, unspecified: Secondary | ICD-10-CM

## 2022-11-20 NOTE — Progress Notes (Signed)
  Subjective:  Patient ID: Abigail Sullivan, female    DOB: 1946/11/16,  MRN: [id number]  Chief Complaint  Patient presents with   Nail Problem    Routine Foot Care     76 y.o. female presents with the above complaint. History confirmed with patient. Patient presenting with pain related to dystrophic thickened elongated nails. Patient is unable to trim own nails related to nail dystrophy and/or mobility issues. Patient does not have a history of T2DM.    Objective:  Physical Exam: warm, good capillary refill nail exam onychomycosis of the toenails, onycholysis, and dystrophic nails DP pulses palpable, PT pulses palpable, and protective sensation intact Left Foot:  Pain with palpation of nails due to elongation and dystrophic growth.  Right Foot: Pain with palpation of nails due to elongation and dystrophic growth.   Assessment:   1. Pain due to onychomycosis of toenails of both feet   2. Hammertoes of both feet   3. Coagulation defect [facility])      Plan:  Patient was evaluated and treated and all questions answered.   #Onychomycosis with pain  -Nails palliatively debrided as below. -Educated on self-care  Procedure: Nail Debridement Rationale: Pain Type of Debridement: manual, sharp debridement. Instrumentation: Nail nipper, rotary burr. Number of Nails: 10  Return in about 3 months (around 02/19/2023) for RFC.         Everitt Amber, DPM [facility] [place] / [facility]

## 2022-12-03 DIAGNOSIS — K745 Biliary cirrhosis, unspecified: Secondary | ICD-10-CM | POA: Diagnosis not present

## 2022-12-03 DIAGNOSIS — I10 Essential (primary) hypertension: Secondary | ICD-10-CM | POA: Diagnosis not present

## 2022-12-10 DIAGNOSIS — E782 Mixed hyperlipidemia: Secondary | ICD-10-CM | POA: Diagnosis not present

## 2022-12-10 DIAGNOSIS — K754 Autoimmune hepatitis: Secondary | ICD-10-CM | POA: Diagnosis not present

## 2022-12-17 DIAGNOSIS — Z1339 Encounter for screening examination for other mental health and behavioral disorders: Secondary | ICD-10-CM | POA: Diagnosis not present

## 2022-12-17 DIAGNOSIS — E782 Mixed hyperlipidemia: Secondary | ICD-10-CM | POA: Diagnosis not present

## 2022-12-17 DIAGNOSIS — Z6820 Body mass index (BMI) 20.0-20.9, adult: Secondary | ICD-10-CM | POA: Diagnosis not present

## 2022-12-17 DIAGNOSIS — Z139 Encounter for screening, unspecified: Secondary | ICD-10-CM | POA: Diagnosis not present

## 2022-12-17 DIAGNOSIS — Z1331 Encounter for screening for depression: Secondary | ICD-10-CM | POA: Diagnosis not present

## 2022-12-17 DIAGNOSIS — Z136 Encounter for screening for cardiovascular disorders: Secondary | ICD-10-CM | POA: Diagnosis not present

## 2022-12-17 DIAGNOSIS — N951 Menopausal and female climacteric states: Secondary | ICD-10-CM | POA: Diagnosis not present

## 2022-12-17 DIAGNOSIS — K754 Autoimmune hepatitis: Secondary | ICD-10-CM | POA: Diagnosis not present

## 2022-12-17 DIAGNOSIS — N3281 Overactive bladder: Secondary | ICD-10-CM | POA: Diagnosis not present

## 2022-12-17 DIAGNOSIS — Z0000 Encounter for general adult medical examination without abnormal findings: Secondary | ICD-10-CM | POA: Diagnosis not present

## 2022-12-17 DIAGNOSIS — Z1389 Encounter for screening for other disorder: Secondary | ICD-10-CM | POA: Diagnosis not present

## 2023-01-03 DIAGNOSIS — K745 Biliary cirrhosis, unspecified: Secondary | ICD-10-CM | POA: Diagnosis not present

## 2023-01-03 DIAGNOSIS — I10 Essential (primary) hypertension: Secondary | ICD-10-CM | POA: Diagnosis not present

## 2023-01-08 DIAGNOSIS — M810 Age-related osteoporosis without current pathological fracture: Secondary | ICD-10-CM | POA: Diagnosis not present

## 2023-01-28 DIAGNOSIS — G4700 Insomnia, unspecified: Secondary | ICD-10-CM | POA: Diagnosis not present

## 2023-01-30 ENCOUNTER — Other Ambulatory Visit: Payer: Self-pay | Admitting: Gastroenterology

## 2023-01-30 DIAGNOSIS — K743 Primary biliary cirrhosis: Secondary | ICD-10-CM

## 2023-01-30 DIAGNOSIS — E875 Hyperkalemia: Secondary | ICD-10-CM | POA: Diagnosis not present

## 2023-01-30 DIAGNOSIS — K869 Disease of pancreas, unspecified: Secondary | ICD-10-CM | POA: Diagnosis not present

## 2023-02-01 DIAGNOSIS — K745 Biliary cirrhosis, unspecified: Secondary | ICD-10-CM | POA: Diagnosis not present

## 2023-02-01 DIAGNOSIS — I10 Essential (primary) hypertension: Secondary | ICD-10-CM | POA: Diagnosis not present

## 2023-02-26 ENCOUNTER — Ambulatory Visit (INDEPENDENT_AMBULATORY_CARE_PROVIDER_SITE_OTHER): Payer: Medicare Other | Admitting: Podiatry

## 2023-02-26 DIAGNOSIS — M79675 Pain in left toe(s): Secondary | ICD-10-CM

## 2023-02-26 DIAGNOSIS — M79674 Pain in right toe(s): Secondary | ICD-10-CM | POA: Diagnosis not present

## 2023-02-26 DIAGNOSIS — B351 Tinea unguium: Secondary | ICD-10-CM

## 2023-02-26 DIAGNOSIS — D689 Coagulation defect, unspecified: Secondary | ICD-10-CM

## 2023-02-26 NOTE — Progress Notes (Signed)
  Subjective:  Patient ID: Abigail Sullivan, female    DOB: [birth year],  MRN: [id number]  Chief Complaint  Patient presents with   Nail Problem    Nail trim     77 y.o. female presents with the above complaint. History confirmed with patient. Patient presenting with pain related to dystrophic thickened elongated nails. Patient is unable to trim own nails related to nail dystrophy and/or mobility issues. Patient does not have a history of T2DM.    Objective:  Physical Exam: warm, good capillary refill nail exam onychomycosis of the toenails, onycholysis, and dystrophic nails DP pulses palpable, PT pulses palpable, and protective sensation intact Left Foot:  Pain with palpation of nails due to elongation and dystrophic growth.  Right Foot: Pain with palpation of nails due to elongation and dystrophic growth.   Assessment:   1. Pain due to onychomycosis of toenails of both feet   2. Coagulation defect [facility])       Plan:  Patient was evaluated and treated and all questions answered.   #Onychomycosis with pain  -Nails palliatively debrided as below. -Educated on self-care  Procedure: Nail Debridement Rationale: Pain Type of Debridement: manual, sharp debridement. Instrumentation: Nail nipper, rotary burr. Number of Nails: 10  Return in about 3 months (around 05/29/2023) for RFC.         Everitt Amber, DPM [facility] [place] / [facility]

## 2023-03-04 DIAGNOSIS — K745 Biliary cirrhosis, unspecified: Secondary | ICD-10-CM | POA: Diagnosis not present

## 2023-03-04 DIAGNOSIS — I10 Essential (primary) hypertension: Secondary | ICD-10-CM | POA: Diagnosis not present

## 2023-03-26 ENCOUNTER — Ambulatory Visit
Admission: RE | Admit: 2023-03-26 | Discharge: 2023-03-26 | Disposition: A | Discharge status: Home / Self Care | Payer: Medicare Other | Source: Ambulatory Visit | Attending: Gastroenterology | Admitting: Gastroenterology

## 2023-03-26 DIAGNOSIS — N200 Calculus of kidney: Secondary | ICD-10-CM | POA: Diagnosis not present

## 2023-03-26 DIAGNOSIS — M438X6 Other specified deforming dorsopathies, lumbar region: Secondary | ICD-10-CM | POA: Diagnosis not present

## 2023-03-26 DIAGNOSIS — K743 Primary biliary cirrhosis: Secondary | ICD-10-CM

## 2023-03-26 DIAGNOSIS — K869 Disease of pancreas, unspecified: Secondary | ICD-10-CM

## 2023-03-26 DIAGNOSIS — K862 Cyst of pancreas: Secondary | ICD-10-CM | POA: Diagnosis not present

## 2023-03-26 DIAGNOSIS — I700 Atherosclerosis of aorta: Secondary | ICD-10-CM | POA: Diagnosis not present

## 2023-03-26 MED ORDER — IOPAMIDOL (ISOVUE-300) INJECTION 61%
90.0000 mL | Freq: Once | INTRAVENOUS | Status: AC | PRN
  Administered 2023-03-26: 90 mL via INTRAVENOUS

## 2023-04-03 DIAGNOSIS — G4700 Insomnia, unspecified: Secondary | ICD-10-CM | POA: Diagnosis not present

## 2023-04-03 DIAGNOSIS — K745 Biliary cirrhosis, unspecified: Secondary | ICD-10-CM | POA: Diagnosis not present

## 2023-04-03 DIAGNOSIS — I10 Essential (primary) hypertension: Secondary | ICD-10-CM | POA: Diagnosis not present

## 2023-04-05 DIAGNOSIS — R3915 Urgency of urination: Secondary | ICD-10-CM | POA: Diagnosis not present

## 2023-04-05 DIAGNOSIS — R351 Nocturia: Secondary | ICD-10-CM | POA: Diagnosis not present

## 2023-04-05 DIAGNOSIS — N3941 Urge incontinence: Secondary | ICD-10-CM | POA: Diagnosis not present

## 2023-05-04 DIAGNOSIS — K745 Biliary cirrhosis, unspecified: Secondary | ICD-10-CM | POA: Diagnosis not present

## 2023-05-04 DIAGNOSIS — I10 Essential (primary) hypertension: Secondary | ICD-10-CM | POA: Diagnosis not present

## 2023-05-24 ENCOUNTER — Ambulatory Visit: Payer: Medicare Other | Admitting: Podiatry

## 2023-05-28 ENCOUNTER — Ambulatory Visit (INDEPENDENT_AMBULATORY_CARE_PROVIDER_SITE_OTHER): Payer: Medicare Other | Admitting: Podiatry

## 2023-05-28 DIAGNOSIS — B351 Tinea unguium: Secondary | ICD-10-CM

## 2023-05-28 DIAGNOSIS — D689 Coagulation defect, unspecified: Secondary | ICD-10-CM

## 2023-05-28 DIAGNOSIS — M79675 Pain in left toe(s): Secondary | ICD-10-CM | POA: Diagnosis not present

## 2023-05-28 DIAGNOSIS — M79674 Pain in right toe(s): Secondary | ICD-10-CM | POA: Diagnosis not present

## 2023-05-28 NOTE — Progress Notes (Signed)
  Subjective:  Patient ID: Abigail Sullivan, female    DOB: August 19, 1946,  MRN: [id number]  RFC   77 y.o. female presents with the above complaint. History confirmed with patient. Patient presenting with pain related to dystrophic thickened elongated nails. Patient is unable to trim own nails related to nail dystrophy and/or mobility issues. Patient does not have a history of T2DM.    Objective:  Physical Exam: warm, good capillary refill nail exam onychomycosis of the toenails, onycholysis, and dystrophic nails DP pulses palpable, PT pulses palpable, and protective sensation intact Left Foot:  Pain with palpation of nails due to elongation and dystrophic growth.  Right Foot: Pain with palpation of nails due to elongation and dystrophic growth.   Assessment:   1. Pain due to onychomycosis of toenails of both feet   2. Coagulation defect [facility])        Plan:  Patient was evaluated and treated and all questions answered.   #Onychomycosis with pain  -Nails palliatively debrided as below. -Educated on self-care  Procedure: Nail Debridement Rationale: Pain Type of Debridement: manual, sharp debridement. Instrumentation: Nail nipper, rotary burr. Number of Nails: 10  Return in about 3 months (around 08/28/2023) for RFC.         Corinna Gab, DPM [facility] / [facility]

## 2023-05-29 ENCOUNTER — Ambulatory Visit: Payer: Medicare Other | Admitting: Podiatry

## 2023-05-29 DIAGNOSIS — R351 Nocturia: Secondary | ICD-10-CM | POA: Diagnosis not present

## 2023-05-29 DIAGNOSIS — N3941 Urge incontinence: Secondary | ICD-10-CM | POA: Diagnosis not present

## 2023-06-03 DIAGNOSIS — I10 Essential (primary) hypertension: Secondary | ICD-10-CM | POA: Diagnosis not present

## 2023-06-03 DIAGNOSIS — K745 Biliary cirrhosis, unspecified: Secondary | ICD-10-CM | POA: Diagnosis not present

## 2023-06-12 DIAGNOSIS — G609 Hereditary and idiopathic neuropathy, unspecified: Secondary | ICD-10-CM | POA: Diagnosis not present

## 2023-06-12 DIAGNOSIS — E782 Mixed hyperlipidemia: Secondary | ICD-10-CM | POA: Diagnosis not present

## 2023-06-12 DIAGNOSIS — I10 Essential (primary) hypertension: Secondary | ICD-10-CM | POA: Diagnosis not present

## 2023-06-12 DIAGNOSIS — Z131 Encounter for screening for diabetes mellitus: Secondary | ICD-10-CM | POA: Diagnosis not present

## 2023-06-14 DIAGNOSIS — E875 Hyperkalemia: Secondary | ICD-10-CM | POA: Diagnosis not present

## 2023-06-18 DIAGNOSIS — E782 Mixed hyperlipidemia: Secondary | ICD-10-CM | POA: Diagnosis not present

## 2023-06-18 DIAGNOSIS — Z6821 Body mass index (BMI) 21.0-21.9, adult: Secondary | ICD-10-CM | POA: Diagnosis not present

## 2023-06-18 DIAGNOSIS — I739 Peripheral vascular disease, unspecified: Secondary | ICD-10-CM | POA: Diagnosis not present

## 2023-06-18 DIAGNOSIS — N1831 Chronic kidney disease, stage 3a: Secondary | ICD-10-CM | POA: Diagnosis not present

## 2023-06-18 DIAGNOSIS — I4891 Unspecified atrial fibrillation: Secondary | ICD-10-CM | POA: Diagnosis not present

## 2023-06-20 NOTE — Progress Notes (Deleted)
  Electrophysiology Office Note:   Date:  06/20/2023  ID:  Abigail Sullivan, DOB [birth year], MRN [id number]  Primary Cardiologist: None Electrophysiologist: Will Jorja Loa, MD  {Click to update primary MD,subspecialty MD or APP then REFRESH:1}    History of Present Illness:   Abigail Sullivan is a 77 y.o. female with h/o CVA, PAC's, HTN, HLD, permanent AF seen today for routine electrophysiology followup.    Since last being seen in our clinic the patient reports doing ***.  she denies chest pain, palpitations, dyspnea, PND, orthopnea, nausea, vomiting, dizziness, syncope, edema, weight gain, or early satiety.   Review of systems complete and found to be negative unless listed in HPI.   EP Information / Studies Reviewed:    {EKGtoday:28818}      Studies:    Risk Assessment/Calculations:    CHA2DS2-VASc Score =    {Confirm score is correct.  If not, click here to update score.  REFRESH note.  :1} This indicates a  % annual risk of stroke. The patient's score is based upon:    {This patient has a significant risk of stroke if diagnosed with atrial fibrillation.  Please consider VKA or DOAC agent for anticoagulation if the bleeding risk is acceptable.   You can also use the SmartPhrase .[email or web address] for documentation.   :[id number]} No BP recorded.  {Refresh Note OR Click here to enter BP  :1}***        Physical Exam:   VS:  There were no vitals taken for this visit.   Wt Readings from Last 3 Encounters:  06/04/22 123 lb 9.6 oz (56.1 kg)  11/07/21 118 lb 9.6 oz (53.8 kg)  07/31/21 117 lb 6.4 oz (53.3 kg)     GEN: Well nourished, well developed in no acute distress NECK: No JVD; No carotid bruits CARDIAC: {EPRHYTHM:28826}, no murmurs, rubs, gallops RESPIRATORY:  Clear to auscultation without rales, wheezing or rhonchi  ABDOMEN: Soft, non-tender, non-distended EXTREMITIES:  No edema; No deformity   ASSESSMENT AND PLAN:    Permanent Atrial Fibrillation  Deemed permanent AF  per Dr. Elberta Fortis note in 06/2022 *** CHA2DS2Vasc ***  -continue eliquis  -***  Secondary Hypercoagulable State  -eliquis as above   HTN  -well controlled      Follow up with {GMWNU:27253} {EPFOLLOW GU:44034}  Signed, Canary Brim, NP

## 2023-06-21 ENCOUNTER — Ambulatory Visit (INDEPENDENT_AMBULATORY_CARE_PROVIDER_SITE_OTHER): Payer: Medicare Other | Admitting: Pulmonary Disease

## 2023-06-21 DIAGNOSIS — I4819 Other persistent atrial fibrillation: Secondary | ICD-10-CM

## 2023-07-04 DIAGNOSIS — K745 Biliary cirrhosis, unspecified: Secondary | ICD-10-CM | POA: Diagnosis not present

## 2023-07-04 DIAGNOSIS — I739 Peripheral vascular disease, unspecified: Secondary | ICD-10-CM | POA: Diagnosis not present

## 2023-07-19 DIAGNOSIS — N3941 Urge incontinence: Secondary | ICD-10-CM | POA: Diagnosis not present

## 2023-07-19 DIAGNOSIS — R351 Nocturia: Secondary | ICD-10-CM | POA: Diagnosis not present

## 2023-07-22 DIAGNOSIS — G4700 Insomnia, unspecified: Secondary | ICD-10-CM | POA: Diagnosis not present

## 2023-07-25 NOTE — Progress Notes (Signed)
  Electrophysiology Office Note:   Date:  07/26/2023  ID:  Abigail, Sullivan [birth year], MRN [id number]  Primary Cardiologist: None Electrophysiologist: Will Jorja Loa, MD      History of Present Illness:   Abigail Sullivan is a 77 y.o. female with h/o CVA, PACs, HTN, HLD, and permanent AF seen today for routine electrophysiology followup.   Since last being seen in our clinic the patient reports doing very well overall. Was having frequent falls from imbalance but worked with PT and now using a cane, hasn't fallen in several months.  Occasionally, she gets an episode of severe palpitations with a "funny" sensation in her chest. Can happen several times in a day, then go several months without any. She previously prescribed this as a "cold feeling" washing over her.   She does states she had similar symptoms previously while her loop was active. Otherwise, she denies exertion chest pain, dyspnea, PND, orthopnea, nausea, vomiting, dizziness, syncope, edema, weight gain, or early satiety.   Review of systems complete and found to be negative unless listed in HPI.   EP Information / Studies Reviewed:    EKG is ordered today. Personal review as below.  EKG Interpretation Date/Time:  Friday July 26 2023 09:25:16 EDT Ventricular Rate:  106 PR Interval:    QRS Duration:  82 QT Interval:  362 QTC Calculation: 480 R Axis:   77  Text Interpretation: Atrial fibrillation with rapid ventricular response Possible Anterior infarct , age undetermined Confirmed by Maxine Glenn [phone]) on 07/26/2023 9:38:09 AM    MDT loop recorder implanted 09/16/2018 for CVA - > AF. EOS.   Physical Exam:   VS:  BP 110/78   Pulse (!) 106   Ht 5\' 4"  (1.626 m)   Wt 129 lb (58.5 kg)   BMI 22.14 kg/m    Wt Readings from Last 3 Encounters:  07/26/23 129 lb (58.5 kg)  06/04/22 123 lb 9.6 oz (56.1 kg)  11/07/21 118 lb 9.6 oz (53.8 kg)     GEN: Well nourished, well developed in no acute distress NECK: No JVD;  No carotid bruits CARDIAC: Irregularly irregular rate and rhythm, no murmurs, rubs, gallops RESPIRATORY:  Clear to auscultation without rales, wheezing or rhonchi  ABDOMEN: Soft, non-tender, non-distended EXTREMITIES:  No edema; No deformity   ASSESSMENT AND PLAN:    Permanent AF EKG today shows AF with mild RVR, slowed once she rested in room. Continue eliquis for CHA2DS2VASc of at least 7 Continue diltiazem 180 mg BID Update Echo, last 2016.   HTN Stable on current regimen    Heavy palpitations Will review old loop data. No NSVT shown in epic search, which was one of my first thoughts.  Encouraged her to assess HR during these times, with a modality such as apple watch, fitbit, or Kardia if possible.   If becomes more frequent can monitor.  Labs today and Echo.    Follow up with Dr. Elberta Fortis in 12 months  Signed, Graciella Freer, PA-C

## 2023-07-26 ENCOUNTER — Ambulatory Visit: Payer: Medicare Other | Attending: Student | Admitting: Student

## 2023-07-26 ENCOUNTER — Encounter: Payer: Self-pay | Admitting: Student

## 2023-07-26 VITALS — BP 110/78 | HR 106 | Ht 64.0 in | Wt 129.0 lb

## 2023-07-26 DIAGNOSIS — I4819 Other persistent atrial fibrillation: Secondary | ICD-10-CM | POA: Diagnosis not present

## 2023-07-26 DIAGNOSIS — R002 Palpitations: Secondary | ICD-10-CM | POA: Diagnosis not present

## 2023-07-26 DIAGNOSIS — I10 Essential (primary) hypertension: Secondary | ICD-10-CM | POA: Diagnosis not present

## 2023-07-26 NOTE — Patient Instructions (Signed)
Medication Instructions:  Your physician recommends that you continue on your current medications as directed. Please refer to the Current Medication list given to you today.  *If you need a refill on your cardiac medications before your next appointment, please call your pharmacy*  Lab Work: BMET, CBC-TODAY If you have labs (blood work) drawn today and your tests are completely normal, you will receive your results only by: MyChart Message (if you have MyChart) OR A paper copy in the mail If you have any lab test that is abnormal or we need to change your treatment, we will call you to review the results.  Testing/Procedures: Your physician has requested that you have an echocardiogram. Echocardiography is a painless test that uses sound waves to create images of your heart. It provides your doctor with information about the size and shape of your heart and how well your heart's chambers and valves are working. This procedure takes approximately one hour. There are no restrictions for this procedure. Please do NOT wear cologne, perfume, aftershave, or lotions (deodorant is allowed). Please arrive 15 minutes prior to your appointment time.    Follow-Up: At [facility], you and your health needs are our priority.  As part of our continuing mission to provide you with exceptional heart care, we have created designated Provider Care Teams.  These Care Teams include your primary Cardiologist (physician) and Advanced Practice Providers (APPs -  Physician Assistants and Nurse Practitioners) who all work together to provide you with the care you need, when you need it.  We recommend signing up for the patient portal called "MyChart".  Sign up information is provided on this After Visit Summary.  MyChart is used to connect with patients for Virtual Visits (Telemedicine).  Patients are able to view lab/test results, encounter notes, upcoming appointments, etc.  Non-urgent messages can be sent to  your provider as well.   To learn more about what you can do with MyChart, go to [email or web address].    Your next appointment:   1 year(s)  Provider:   Dr Elberta Fortis in [place]

## 2023-07-27 LAB — CBC
Hematocrit: 44.9 % (ref 34.0–46.6)
Hemoglobin: 14.4 g/dL (ref 11.1–15.9)
MCH: 29.8 pg (ref 26.6–33.0)
MCHC: 32.1 g/dL (ref 31.5–35.7)
MCV: 93 fL (ref 79–97)
Platelets: 228 10*3/uL (ref 150–450)
RBC: 4.83 x10E6/uL (ref 3.77–5.28)
RDW: 12.9 % (ref 11.7–15.4)
WBC: 8.3 10*3/uL (ref 3.4–10.8)

## 2023-07-27 LAB — BASIC METABOLIC PANEL
BUN/Creatinine Ratio: 14 (ref 12–28)
BUN: 16 mg/dL (ref 8–27)
CO2: 23 mmol/L (ref 20–29)
Calcium: 10.3 mg/dL (ref 8.7–10.3)
Chloride: 104 mmol/L (ref 96–106)
Creatinine, Ser: 1.14 mg/dL — ABNORMAL HIGH (ref 0.57–1.00)
Glucose: 97 mg/dL (ref 70–99)
Potassium: 4.6 mmol/L (ref 3.5–5.2)
Sodium: 143 mmol/L (ref 134–144)
eGFR: 50 mL/min/{1.73_m2} — ABNORMAL LOW (ref >59)

## 2023-08-01 DIAGNOSIS — G8929 Other chronic pain: Secondary | ICD-10-CM | POA: Diagnosis not present

## 2023-08-01 DIAGNOSIS — M79606 Pain in leg, unspecified: Secondary | ICD-10-CM | POA: Diagnosis not present

## 2023-08-01 DIAGNOSIS — Z6822 Body mass index (BMI) 22.0-22.9, adult: Secondary | ICD-10-CM | POA: Diagnosis not present

## 2023-08-04 DIAGNOSIS — K745 Biliary cirrhosis, unspecified: Secondary | ICD-10-CM | POA: Diagnosis not present

## 2023-08-04 DIAGNOSIS — I739 Peripheral vascular disease, unspecified: Secondary | ICD-10-CM | POA: Diagnosis not present

## 2023-08-13 ENCOUNTER — Other Ambulatory Visit: Payer: Self-pay | Admitting: Cardiology

## 2023-08-15 ENCOUNTER — Other Ambulatory Visit: Payer: Self-pay | Admitting: *Deleted

## 2023-08-15 DIAGNOSIS — I480 Paroxysmal atrial fibrillation: Secondary | ICD-10-CM

## 2023-08-15 MED ORDER — APIXABAN 5 MG PO TABS: 5.0000 mg | ORAL_TABLET | Freq: Two times a day (BID) | ORAL | 1 refills | Status: DC

## 2023-08-15 NOTE — Telephone Encounter (Signed)
Eliquis 5mg  refill request received. Patient is 77 years old, weight-58.5kg, Crea-1.14 on 07/26/23, Diagnosis-Afib, and last seen by Otilio Saber on 07/26/23. Dose is appropriate based on dosing criteria. Will send in refill to requested pharmacy.

## 2023-08-26 DIAGNOSIS — H04123 Dry eye syndrome of bilateral lacrimal glands: Secondary | ICD-10-CM | POA: Diagnosis not present

## 2023-08-26 DIAGNOSIS — H43811 Vitreous degeneration, right eye: Secondary | ICD-10-CM | POA: Diagnosis not present

## 2023-08-26 DIAGNOSIS — H40013 Open angle with borderline findings, low risk, bilateral: Secondary | ICD-10-CM | POA: Diagnosis not present

## 2023-08-26 DIAGNOSIS — H0102A Squamous blepharitis right eye, upper and lower eyelids: Secondary | ICD-10-CM | POA: Diagnosis not present

## 2023-08-26 DIAGNOSIS — H0102B Squamous blepharitis left eye, upper and lower eyelids: Secondary | ICD-10-CM | POA: Diagnosis not present

## 2023-08-26 DIAGNOSIS — H53461 Homonymous bilateral field defects, right side: Secondary | ICD-10-CM | POA: Diagnosis not present

## 2023-09-02 ENCOUNTER — Ambulatory Visit (INDEPENDENT_AMBULATORY_CARE_PROVIDER_SITE_OTHER): Payer: Medicare Other | Admitting: Podiatry

## 2023-09-02 DIAGNOSIS — M79675 Pain in left toe(s): Secondary | ICD-10-CM | POA: Diagnosis not present

## 2023-09-02 DIAGNOSIS — M79674 Pain in right toe(s): Secondary | ICD-10-CM

## 2023-09-02 DIAGNOSIS — B351 Tinea unguium: Secondary | ICD-10-CM

## 2023-09-02 DIAGNOSIS — D689 Coagulation defect, unspecified: Secondary | ICD-10-CM

## 2023-09-02 NOTE — Progress Notes (Signed)
Subjective:  Patient ID: Abigail Sullivan, female    DOB: Jan 16, 1946,  MRN: [id number]  RFC   77 y.o. female presents with the above complaint. History confirmed with patient. Patient presenting with pain related to dystrophic thickened elongated nails. Patient is unable to trim own nails related to nail dystrophy and/or mobility issues. Patient does not have a history of T2DM.    Objective:  Physical Exam: warm, good capillary refill nail exam onychomycosis of the toenails, onycholysis, and dystrophic nails DP pulses palpable, PT pulses palpable, and protective sensation intact Left Foot:  Pain with palpation of nails due to elongation and dystrophic growth.  Right Foot: Pain with palpation of nails due to elongation and dystrophic growth.   Assessment:   1. Pain due to onychomycosis of toenails of both feet   2. Coagulation defect [facility])         Plan:  Patient was evaluated and treated and all questions answered.   #Onychomycosis with pain  -Nails palliatively debrided as below. -Educated on self-care  Procedure: Nail Debridement Rationale: Pain Type of Debridement: manual, sharp debridement. Instrumentation: Nail nipper, rotary burr. Number of Nails: 10  Return in about 3 months (around 12/02/2023) for [facility].         Corinna Gab, DPM [facility]

## 2023-09-03 DIAGNOSIS — K745 Biliary cirrhosis, unspecified: Secondary | ICD-10-CM | POA: Diagnosis not present

## 2023-09-03 DIAGNOSIS — Z23 Encounter for immunization: Secondary | ICD-10-CM | POA: Diagnosis not present

## 2023-09-03 DIAGNOSIS — I739 Peripheral vascular disease, unspecified: Secondary | ICD-10-CM | POA: Diagnosis not present

## 2023-09-04 ENCOUNTER — Ambulatory Visit: Payer: Medicare Other

## 2023-10-04 DIAGNOSIS — K745 Biliary cirrhosis, unspecified: Secondary | ICD-10-CM | POA: Diagnosis not present

## 2023-10-04 DIAGNOSIS — I739 Peripheral vascular disease, unspecified: Secondary | ICD-10-CM | POA: Diagnosis not present

## 2023-10-07 ENCOUNTER — Ambulatory Visit: Payer: Medicare Other | Attending: Student

## 2023-10-07 DIAGNOSIS — I4819 Other persistent atrial fibrillation: Secondary | ICD-10-CM | POA: Insufficient documentation

## 2023-10-07 LAB — ECHOCARDIOGRAM COMPLETE
MV M vel: 5.35 m/s
MV Peak grad: 114.5 mm[Hg]
Radius: 1 cm
S' Lateral: 3.6 cm
Single Plane A4C EF: 56.5 %

## 2023-10-08 NOTE — Progress Notes (Signed)
Cancelled. Error.  

## 2023-10-23 DIAGNOSIS — G4700 Insomnia, unspecified: Secondary | ICD-10-CM | POA: Diagnosis not present

## 2023-10-28 DIAGNOSIS — M5416 Radiculopathy, lumbar region: Secondary | ICD-10-CM | POA: Diagnosis not present

## 2023-11-03 DIAGNOSIS — K745 Biliary cirrhosis, unspecified: Secondary | ICD-10-CM | POA: Diagnosis not present

## 2023-11-03 DIAGNOSIS — I739 Peripheral vascular disease, unspecified: Secondary | ICD-10-CM | POA: Diagnosis not present

## 2023-11-11 ENCOUNTER — Other Ambulatory Visit: Payer: Self-pay | Admitting: Family Medicine

## 2023-11-11 DIAGNOSIS — Z1231 Encounter for screening mammogram for malignant neoplasm of breast: Secondary | ICD-10-CM

## 2023-11-15 DIAGNOSIS — E782 Mixed hyperlipidemia: Secondary | ICD-10-CM | POA: Diagnosis not present

## 2023-11-15 DIAGNOSIS — I10 Essential (primary) hypertension: Secondary | ICD-10-CM | POA: Diagnosis not present

## 2023-11-15 DIAGNOSIS — G609 Hereditary and idiopathic neuropathy, unspecified: Secondary | ICD-10-CM | POA: Diagnosis not present

## 2023-11-20 ENCOUNTER — Ambulatory Visit
Admission: RE | Admit: 2023-11-20 | Discharge: 2023-11-20 | Disposition: A | Discharge status: Home / Self Care | Payer: Medicare Other | Source: Ambulatory Visit | Attending: Family Medicine | Admitting: Family Medicine

## 2023-11-20 DIAGNOSIS — Z1231 Encounter for screening mammogram for malignant neoplasm of breast: Secondary | ICD-10-CM

## 2023-12-02 DIAGNOSIS — I10 Essential (primary) hypertension: Secondary | ICD-10-CM | POA: Diagnosis not present

## 2023-12-02 DIAGNOSIS — R232 Flushing: Secondary | ICD-10-CM | POA: Diagnosis not present

## 2023-12-02 DIAGNOSIS — Z6823 Body mass index (BMI) 23.0-23.9, adult: Secondary | ICD-10-CM | POA: Diagnosis not present

## 2023-12-02 DIAGNOSIS — N1831 Chronic kidney disease, stage 3a: Secondary | ICD-10-CM | POA: Diagnosis not present

## 2023-12-02 DIAGNOSIS — E782 Mixed hyperlipidemia: Secondary | ICD-10-CM | POA: Diagnosis not present

## 2023-12-04 DIAGNOSIS — K745 Biliary cirrhosis, unspecified: Secondary | ICD-10-CM | POA: Diagnosis not present

## 2023-12-04 DIAGNOSIS — I739 Peripheral vascular disease, unspecified: Secondary | ICD-10-CM | POA: Diagnosis not present

## 2023-12-09 ENCOUNTER — Ambulatory Visit: Payer: Medicare Other | Admitting: Podiatry

## 2023-12-10 ENCOUNTER — Encounter: Payer: Self-pay | Admitting: Podiatry

## 2023-12-10 ENCOUNTER — Ambulatory Visit (INDEPENDENT_AMBULATORY_CARE_PROVIDER_SITE_OTHER): Payer: Medicare Other | Admitting: Podiatry

## 2023-12-10 DIAGNOSIS — M79674 Pain in right toe(s): Secondary | ICD-10-CM

## 2023-12-10 DIAGNOSIS — B351 Tinea unguium: Secondary | ICD-10-CM

## 2023-12-10 DIAGNOSIS — D689 Coagulation defect, unspecified: Secondary | ICD-10-CM

## 2023-12-10 DIAGNOSIS — M79675 Pain in left toe(s): Secondary | ICD-10-CM

## 2023-12-10 NOTE — Progress Notes (Signed)
  Subjective:  Patient ID: Abigail Sullivan, female    DOB: [birth year],  MRN: [id number]  RFC   78 y.o. female presents with the above complaint. History confirmed with patient. Patient presenting with pain related to dystrophic thickened elongated nails. Patient is unable to trim own nails related to nail dystrophy and/or mobility issues. Patient does not have a history of T2DM.  Patient is on Eliquis  for A-fib.  Objective:  Physical Exam: warm, good capillary refill nail exam onychomycosis of the toenails, onycholysis, and dystrophic nails DP pulses palpable, PT pulses palpable, and protective sensation intact Left Foot:  Pain with palpation of nails due to elongation and dystrophic growth.  Right Foot: Pain with palpation of nails due to elongation and dystrophic growth.   Assessment:   1. Pain due to onychomycosis of toenails of both feet   2. Coagulation defect [facility])         Plan:  Patient was evaluated and treated and all questions answered.   #Onychomycosis with pain  -Nails palliatively debrided as below. -Educated on self-care  Procedure: Nail Debridement Rationale: Pain Type of Debridement: manual, sharp debridement. Instrumentation: Nail nipper, rotary burr. Number of Nails: 10  Return in about 3 months (around 03/09/2024) for Routine Foot Care.         Ethan Saddler, DPM [facility] / [facility]

## 2023-12-11 DIAGNOSIS — M5416 Radiculopathy, lumbar region: Secondary | ICD-10-CM | POA: Diagnosis not present

## 2023-12-11 DIAGNOSIS — M4120 Other idiopathic scoliosis, site unspecified: Secondary | ICD-10-CM | POA: Diagnosis not present

## 2023-12-16 DIAGNOSIS — G4700 Insomnia, unspecified: Secondary | ICD-10-CM | POA: Diagnosis not present

## 2023-12-16 DIAGNOSIS — R2681 Unsteadiness on feet: Secondary | ICD-10-CM | POA: Diagnosis not present

## 2023-12-16 DIAGNOSIS — M549 Dorsalgia, unspecified: Secondary | ICD-10-CM | POA: Diagnosis not present

## 2023-12-16 DIAGNOSIS — F419 Anxiety disorder, unspecified: Secondary | ICD-10-CM | POA: Diagnosis not present

## 2023-12-17 ENCOUNTER — Other Ambulatory Visit: Payer: Self-pay | Admitting: Neurosurgery

## 2023-12-17 DIAGNOSIS — M5416 Radiculopathy, lumbar region: Secondary | ICD-10-CM

## 2024-01-03 DIAGNOSIS — K754 Autoimmune hepatitis: Secondary | ICD-10-CM | POA: Diagnosis not present

## 2024-01-04 DIAGNOSIS — I739 Peripheral vascular disease, unspecified: Secondary | ICD-10-CM | POA: Diagnosis not present

## 2024-01-04 DIAGNOSIS — K745 Biliary cirrhosis, unspecified: Secondary | ICD-10-CM | POA: Diagnosis not present

## 2024-01-16 DIAGNOSIS — M5135 Other intervertebral disc degeneration, thoracolumbar region: Secondary | ICD-10-CM | POA: Diagnosis not present

## 2024-01-16 DIAGNOSIS — M5416 Radiculopathy, lumbar region: Secondary | ICD-10-CM | POA: Diagnosis not present

## 2024-01-16 DIAGNOSIS — M4807 Spinal stenosis, lumbosacral region: Secondary | ICD-10-CM | POA: Diagnosis not present

## 2024-01-16 DIAGNOSIS — M48061 Spinal stenosis, lumbar region without neurogenic claudication: Secondary | ICD-10-CM | POA: Diagnosis not present

## 2024-01-16 DIAGNOSIS — M5126 Other intervertebral disc displacement, lumbar region: Secondary | ICD-10-CM | POA: Diagnosis not present

## 2024-01-24 DIAGNOSIS — R351 Nocturia: Secondary | ICD-10-CM | POA: Diagnosis not present

## 2024-01-24 DIAGNOSIS — N3941 Urge incontinence: Secondary | ICD-10-CM | POA: Diagnosis not present

## 2024-01-27 ENCOUNTER — Telehealth: Payer: Self-pay | Admitting: Cardiology

## 2024-01-27 NOTE — Telephone Encounter (Signed)
   Pre-operative Risk Assessment    Patient Name: Abigail Sullivan  DOB: 1946/08/16 MRN: [id number]   Date of last office visit: 07/26/2023 Date of next office visit: none   Request for Surgical Clearance    Procedure:   Rt L4-5 TFESI  Date of Surgery:  Clearance TBD                                Surgeon:  Dr. Aileen Fass  Surgeon's Group or Practice Name:  [facility] Phone number:  [phone] Fax number:  [phone]   Type of Clearance Requested:   - Medical  - Pharmacy:  Hold Apixaban (Eliquis) Hold for 3 days prior and resume the next day   Type of Anesthesia:  Not Indicated   Additional requests/questions:    Sharen Hones   01/27/2024, 4:26 PM

## 2024-01-27 NOTE — Telephone Encounter (Signed)
 Pharmacy please advise on holding Eliquis prior to right L4-L5 transforaminal ESI scheduled for TBD. Thank you.

## 2024-01-28 ENCOUNTER — Telehealth: Payer: Self-pay | Admitting: *Deleted

## 2024-01-28 NOTE — Telephone Encounter (Signed)
 Patient with diagnosis of Afib on Eliquis for anticoagulation.    Procedure: right L4-L5 transforaminal ESI  Date of procedure: TBD   CHA2DS2-VASc Score = 7   This indicates a 11.2% annual risk of stroke. The patient's score is based upon: CHF History: 0 HTN History: 1 Diabetes History: 0 Stroke History: 2 Vascular Disease History: 1 Age Score: 2 Gender Score: 1     CrCl 38 mL/min Platelet count 228 K   Per office protocol, patient can hold Eliquis for 3 days prior to procedure.     **This guidance is not considered finalized until pre-operative APP has relayed final recommendations.**

## 2024-01-28 NOTE — Telephone Encounter (Addendum)
 Pt scheduled tele preop 02/10/24. Med rec and consent are done.

## 2024-01-28 NOTE — Telephone Encounter (Signed)
   Name: Abigail Sullivan  DOB: [birth year]  MRN: [id number]  Primary Cardiologist: None   Preoperative team, please contact this patient and set up a phone call appointment for further preoperative risk assessment. Please obtain consent and complete medication review. Thank you for your help.  I confirm that guidance regarding antiplatelet and oral anticoagulation therapy has been completed and, if necessary, noted below.  Per office protocol, patient can hold Eliquis for 3 days prior to procedure.      I also confirmed the patient resides in the state of [place]. As per [facility] Medical Board telemedicine laws, the patient must reside in the state in which the provider is licensed.   Napoleon Form, Leodis Rains, NP 01/28/2024, 10:44 AM [facility]

## 2024-01-28 NOTE — Telephone Encounter (Signed)
 CORRECTION ON TELE APPT DATE 02/10/24, NOT 02/07/24

## 2024-01-28 NOTE — Telephone Encounter (Signed)
 Pt scheduled tele preop 02/07/24. Med rec and consent are done.     Patient Consent for Virtual Visit        Abigail Sullivan has provided verbal consent on 01/28/2024 for a virtual visit (video or telephone).   CONSENT FOR VIRTUAL VISIT FOR:  Abigail Sullivan  By participating in this virtual visit I agree to the following:  I hereby voluntarily request, consent and authorize [facility] and its employed or contracted physicians, physician assistants, nurse practitioners or other licensed health care professionals (the Practitioner), to provide me with telemedicine health care services (the "Services") as deemed necessary by the treating Practitioner. I acknowledge and consent to receive the Services by the Practitioner via telemedicine. I understand that the telemedicine visit will involve communicating with the Practitioner through live audiovisual communication technology and the disclosure of certain medical information by electronic transmission. I acknowledge that I have been given the opportunity to request an in-person assessment or other available alternative prior to the telemedicine visit and am voluntarily participating in the telemedicine visit.  I understand that I have the right to withhold or withdraw my consent to the use of telemedicine in the course of my care at any time, without affecting my right to future care or treatment, and that the Practitioner or I may terminate the telemedicine visit at any time. I understand that I have the right to inspect all information obtained and/or recorded in the course of the telemedicine visit and may receive copies of available information for a reasonable fee.  I understand that some of the potential risks of receiving the Services via telemedicine include:  Delay or interruption in medical evaluation due to technological equipment failure or disruption; Information transmitted may not be sufficient (e.g. poor resolution of images) to  allow for appropriate medical decision making by the Practitioner; and/or  In rare instances, security protocols could fail, causing a breach of personal health information.  Furthermore, I acknowledge that it is my responsibility to provide information about my medical history, conditions and care that is complete and accurate to the best of my ability. I acknowledge that Practitioner's advice, recommendations, and/or decision may be based on factors not within their control, such as incomplete or inaccurate data provided by me or distortions of diagnostic images or specimens that may result from electronic transmissions. I understand that the practice of medicine is not an exact science and that Practitioner makes no warranties or guarantees regarding treatment outcomes. I acknowledge that a copy of this consent can be made available to me via my patient portal [facility] MyChart), or I can request a printed copy by calling the office of [facility].    I understand that my insurance will be billed for this visit.   I have read or had this consent read to me. I understand the contents of this consent, which adequately explains the benefits and risks of the Services being provided via telemedicine.  I have been provided ample opportunity to ask questions regarding this consent and the Services and have had my questions answered to my satisfaction. I give my informed consent for the services to be provided through the use of telemedicine in my medical care

## 2024-01-31 DIAGNOSIS — M5416 Radiculopathy, lumbar region: Secondary | ICD-10-CM | POA: Diagnosis not present

## 2024-01-31 DIAGNOSIS — M4120 Other idiopathic scoliosis, site unspecified: Secondary | ICD-10-CM | POA: Diagnosis not present

## 2024-02-10 ENCOUNTER — Ambulatory Visit: Payer: Medicare Other | Attending: Physician Assistant | Admitting: Physician Assistant

## 2024-02-10 DIAGNOSIS — Z01810 Encounter for preprocedural cardiovascular examination: Secondary | ICD-10-CM | POA: Insufficient documentation

## 2024-02-10 NOTE — Progress Notes (Signed)
   Virtual Visit via Telephone Note   Because of Abigail Sullivan co-morbid illnesses, she is at least at moderate risk for complications without adequate follow up.  This format is felt to be most appropriate for this patient at this time.  Due to technical limitations with video connection (technology), today's appointment will be conducted as an audio only telehealth visit, and Inza Folta verbally agreed to proceed in this manner.   All issues noted in this document were discussed and addressed.  No physical exam could be performed with this format.  Evaluation Performed:  Preoperative cardiovascular risk assessment _____________   Date:  02/10/2024   Patient ID:  Abigail Sullivan, DOB [birth year], MRN [id number] Patient Location:  Home Provider location:   Office  Primary Care Provider:  Abner Greenspan, MD Primary Cardiologist:  None  Chief Complaint / Patient Profile   78 y.o. y/o female with a h/o  Permanent atrial fibrillation Hx of ILR TTE 10/07/2023: EF 55-60, no RWMA, mild LVH, BAE, PFO versus ASD, moderate MR Atrial tachycardia Hx CVA Hypertension Hyperlipidemia Myoview 08/18/2018 [facility]): EF 61, no ischemia PACs  who is pending epidural steroid injection with Dr. Lorrine Kin (type of anesthesia unknown) and presents today for telephonic preoperative cardiovascular risk assessment.  Request is to hold apixaban.  His history has been reviewed by our Pharm.D. with recommendations per protocol to hold apixaban for 3 days prior to his procedure.  History of Present Illness    Abigail Sullivan is a 78 y.o. female who presents via audio/video conferencing for a telehealth visit today.  Pt was last seen in cardiology clinic on 07/26/23 by Otilio Saber, PA-C.  At that time Cherice Glennie was doing well.  The patient is now pending procedure as outlined above. Since her last visit, she has been doing well.  She has not had chest discomfort, shortness of breath, syncope.   Physical Exam     Vital Signs:  Zyan Wolff does not have vital signs available for review today.  Given telephonic nature of communication, physical exam is limited. AAOx3. NAD. Normal affect.  Speech and respirations are unlabored.  Accessory Clinical Findings    None  Assessment & Plan    1.  Preoperative Cardiovascular Risk Assessment: Ms. Crossland perioperative risk of a major cardiac event is 0.9% according to the Revised Cardiac Risk Index (RCRI).  Therefore, she is at low risk for perioperative complications.   Her functional capacity is good at 4.31 METs according to the Duke Activity Status Index (DASI). Recommendations: According to ACC/AHA guidelines, no further cardiovascular testing needed.  The patient may proceed to surgery at acceptable risk.   Antiplatelet and/or Anticoagulation Recommendations: Eliquis (Apixaban) can be held for 3 days prior to surgery.  Please resume post op when felt to be safe.    A copy of this note will be routed to requesting surgeon.  Time:   Today, I have spent 10 minutes with the patient with telehealth technology discussing medical history, symptoms, and management plan.     Tereso Newcomer, PA-C  02/10/2024, 7:48 AM

## 2024-02-10 NOTE — Telephone Encounter (Signed)
 Notes faxed to surgeon. Tereso Newcomer, PA-C  02/10/2024 6:04 PM

## 2024-02-17 ENCOUNTER — Other Ambulatory Visit: Payer: Self-pay | Admitting: *Deleted

## 2024-02-17 DIAGNOSIS — I480 Paroxysmal atrial fibrillation: Secondary | ICD-10-CM

## 2024-02-17 MED ORDER — APIXABAN 5 MG PO TABS: 5.0000 mg | ORAL_TABLET | Freq: Two times a day (BID) | ORAL | 1 refills | Status: DC

## 2024-02-17 NOTE — Telephone Encounter (Signed)
 Eliquis 5mg  refill request received. Patient is 78 years old, weight-58.5kg, Crea-1.14 on 07/26/23, Diagnosis-Afib, and last seen by Otilio Saber on 823/24 and Tereso Newcomer on 02/10/24-preop appt. Dose is appropriate based on dosing criteria. Will send in refill to requested pharmacy.

## 2024-02-18 DIAGNOSIS — G609 Hereditary and idiopathic neuropathy, unspecified: Secondary | ICD-10-CM | POA: Diagnosis not present

## 2024-02-18 DIAGNOSIS — E782 Mixed hyperlipidemia: Secondary | ICD-10-CM | POA: Diagnosis not present

## 2024-02-18 DIAGNOSIS — I10 Essential (primary) hypertension: Secondary | ICD-10-CM | POA: Diagnosis not present

## 2024-02-21 DIAGNOSIS — E875 Hyperkalemia: Secondary | ICD-10-CM | POA: Diagnosis not present

## 2024-03-03 DIAGNOSIS — E46 Unspecified protein-calorie malnutrition: Secondary | ICD-10-CM | POA: Diagnosis not present

## 2024-03-03 DIAGNOSIS — N1831 Chronic kidney disease, stage 3a: Secondary | ICD-10-CM | POA: Diagnosis not present

## 2024-03-05 DIAGNOSIS — E875 Hyperkalemia: Secondary | ICD-10-CM | POA: Diagnosis not present

## 2024-03-09 DIAGNOSIS — K862 Cyst of pancreas: Secondary | ICD-10-CM | POA: Diagnosis not present

## 2024-03-09 DIAGNOSIS — K7460 Unspecified cirrhosis of liver: Secondary | ICD-10-CM | POA: Diagnosis not present

## 2024-03-09 DIAGNOSIS — K743 Primary biliary cirrhosis: Secondary | ICD-10-CM | POA: Diagnosis not present

## 2024-03-10 ENCOUNTER — Ambulatory Visit (INDEPENDENT_AMBULATORY_CARE_PROVIDER_SITE_OTHER): Payer: Medicare Other | Admitting: Podiatry

## 2024-03-10 ENCOUNTER — Encounter: Payer: Self-pay | Admitting: Podiatry

## 2024-03-10 DIAGNOSIS — E875 Hyperkalemia: Secondary | ICD-10-CM | POA: Diagnosis not present

## 2024-03-10 DIAGNOSIS — M79675 Pain in left toe(s): Secondary | ICD-10-CM | POA: Diagnosis not present

## 2024-03-10 DIAGNOSIS — M79674 Pain in right toe(s): Secondary | ICD-10-CM

## 2024-03-10 DIAGNOSIS — B351 Tinea unguium: Secondary | ICD-10-CM

## 2024-03-10 DIAGNOSIS — D689 Coagulation defect, unspecified: Secondary | ICD-10-CM

## 2024-03-10 DIAGNOSIS — M2042 Other hammer toe(s) (acquired), left foot: Secondary | ICD-10-CM

## 2024-03-10 DIAGNOSIS — M2041 Other hammer toe(s) (acquired), right foot: Secondary | ICD-10-CM

## 2024-03-10 NOTE — Progress Notes (Signed)
  Subjective:  Patient ID: Abigail Sullivan, female    DOB: 02-May-1946,  MRN: [id number]  RFC   78 y.o. female presents with the above complaint. History confirmed with patient. Patient presenting with pain related to dystrophic thickened elongated nails. Patient is unable to trim own nails related to nail dystrophy and/or mobility issues. Patient does not have a history of T2DM.  Patient is on Eliquis for A-fib.  Objective:  Physical Exam: warm, good capillary refill nail exam onychomycosis of the toenails, onycholysis, and dystrophic nails DP pulses palpable, PT pulses palpable, and protective sensation intact Left Foot:  Pain with palpation of nails due to elongation and dystrophic growth.  Hammertoes of all digits, semirigid Right Foot: Pain with palpation of nails due to elongation and dystrophic growth.  Hammertoes of all digits, semirigid  Ambulates with cane  Assessment:   1. Pain due to onychomycosis of toenails of both feet   2. Coagulation defect (HCC)   3. Hammertoes of both feet         Plan:  Patient was evaluated and treated and all questions answered.   #Onychomycosis with pain  -Nails palliatively debrided as below. -Educated on self-care -On chronic Eliquis  Procedure: Nail Debridement Rationale: Pain Type of Debridement: manual, sharp debridement. Instrumentation: Nail nipper, rotary burr. Number of Nails: 10  Monitor hammertoes for signs of skin irritation, callus formation or skin breakdown at dorsal joint prominences or tips of toes.  Return in about 3 months (around 06/09/2024) for Routine Foot Care.         Bronwen Betters, DPM [facility] / [facility]

## 2024-04-06 DIAGNOSIS — Z136 Encounter for screening for cardiovascular disorders: Secondary | ICD-10-CM | POA: Diagnosis not present

## 2024-04-06 DIAGNOSIS — Z6823 Body mass index (BMI) 23.0-23.9, adult: Secondary | ICD-10-CM | POA: Diagnosis not present

## 2024-04-06 DIAGNOSIS — Z1389 Encounter for screening for other disorder: Secondary | ICD-10-CM | POA: Diagnosis not present

## 2024-04-06 DIAGNOSIS — Z139 Encounter for screening, unspecified: Secondary | ICD-10-CM | POA: Diagnosis not present

## 2024-04-06 DIAGNOSIS — Z1331 Encounter for screening for depression: Secondary | ICD-10-CM | POA: Diagnosis not present

## 2024-04-06 DIAGNOSIS — N1831 Chronic kidney disease, stage 3a: Secondary | ICD-10-CM | POA: Diagnosis not present

## 2024-04-06 DIAGNOSIS — Z0000 Encounter for general adult medical examination without abnormal findings: Secondary | ICD-10-CM | POA: Diagnosis not present

## 2024-04-06 DIAGNOSIS — K745 Biliary cirrhosis, unspecified: Secondary | ICD-10-CM | POA: Diagnosis not present

## 2024-04-06 DIAGNOSIS — Z1339 Encounter for screening examination for other mental health and behavioral disorders: Secondary | ICD-10-CM | POA: Diagnosis not present

## 2024-04-06 DIAGNOSIS — D6869 Other thrombophilia: Secondary | ICD-10-CM | POA: Diagnosis not present

## 2024-04-06 DIAGNOSIS — I4891 Unspecified atrial fibrillation: Secondary | ICD-10-CM | POA: Diagnosis not present

## 2024-05-03 DIAGNOSIS — E46 Unspecified protein-calorie malnutrition: Secondary | ICD-10-CM | POA: Diagnosis not present

## 2024-05-03 DIAGNOSIS — N1831 Chronic kidney disease, stage 3a: Secondary | ICD-10-CM | POA: Diagnosis not present

## 2024-05-05 DIAGNOSIS — B009 Herpesviral infection, unspecified: Secondary | ICD-10-CM | POA: Diagnosis not present

## 2024-05-05 DIAGNOSIS — D2239 Melanocytic nevi of other parts of face: Secondary | ICD-10-CM | POA: Diagnosis not present

## 2024-05-11 DIAGNOSIS — R351 Nocturia: Secondary | ICD-10-CM | POA: Diagnosis not present

## 2024-05-22 DIAGNOSIS — I10 Essential (primary) hypertension: Secondary | ICD-10-CM | POA: Diagnosis not present

## 2024-05-22 DIAGNOSIS — E782 Mixed hyperlipidemia: Secondary | ICD-10-CM | POA: Diagnosis not present

## 2024-05-23 DIAGNOSIS — L820 Inflamed seborrheic keratosis: Secondary | ICD-10-CM | POA: Diagnosis not present

## 2024-05-23 DIAGNOSIS — D485 Neoplasm of uncertain behavior of skin: Secondary | ICD-10-CM | POA: Diagnosis not present

## 2024-05-30 ENCOUNTER — Encounter (HOSPITAL_COMMUNITY): Payer: Self-pay | Admitting: Interventional Radiology

## 2024-06-01 DIAGNOSIS — N951 Menopausal and female climacteric states: Secondary | ICD-10-CM | POA: Diagnosis not present

## 2024-06-01 DIAGNOSIS — Z6823 Body mass index (BMI) 23.0-23.9, adult: Secondary | ICD-10-CM | POA: Diagnosis not present

## 2024-06-01 DIAGNOSIS — K745 Biliary cirrhosis, unspecified: Secondary | ICD-10-CM | POA: Diagnosis not present

## 2024-06-01 DIAGNOSIS — E782 Mixed hyperlipidemia: Secondary | ICD-10-CM | POA: Diagnosis not present

## 2024-06-02 DIAGNOSIS — N1831 Chronic kidney disease, stage 3a: Secondary | ICD-10-CM | POA: Diagnosis not present

## 2024-06-02 DIAGNOSIS — E46 Unspecified protein-calorie malnutrition: Secondary | ICD-10-CM | POA: Diagnosis not present

## 2024-06-09 ENCOUNTER — Ambulatory Visit: Admitting: Podiatry

## 2024-06-09 DIAGNOSIS — B351 Tinea unguium: Secondary | ICD-10-CM

## 2024-06-09 DIAGNOSIS — M79674 Pain in right toe(s): Secondary | ICD-10-CM

## 2024-06-09 DIAGNOSIS — M79675 Pain in left toe(s): Secondary | ICD-10-CM

## 2024-06-09 DIAGNOSIS — D689 Coagulation defect, unspecified: Secondary | ICD-10-CM

## 2024-06-09 NOTE — Progress Notes (Unsigned)
  Subjective:  Patient ID: Abigail Sullivan, female    DOB: November 08, 1946,  MRN: [id number]  RFC   78 y.o. female presents with the above complaint. History confirmed with patient. Patient presenting with pain related to dystrophic thickened elongated nails. Patient is unable to trim own nails related to nail dystrophy and/or mobility issues. Patient does not have a history of T2DM.  Patient is on Eliquis  for A-fib.  Objective:  Physical Exam: warm, good capillary refill nail exam onychomycosis of the toenails, onycholysis, and dystrophic nails DP pulses palpable, PT pulses palpable, and protective sensation intact Left Foot:  Pain with palpation of nails due to elongation and dystrophic growth.  Hammertoes of all digits, semirigid Right Foot: Pain with palpation of nails due to elongation and dystrophic growth.  Hammertoes of all digits, semirigid  Ambulates with cane  Assessment:   1. Pain due to onychomycosis of toenails of both feet   2. Coagulation defect [facility])      Plan:  Patient was evaluated and treated and all questions answered.   #Onychomycosis with pain  -Nails palliatively debrided as below. -Educated on self-care -On chronic Eliquis , coagulation defect  Procedure: Nail Debridement Rationale: Pain Type of Debridement: manual, sharp debridement. Instrumentation: Nail nipper, rotary burr. Number of Nails: 10  Monitor dorsal scabs to right first toe hammertoes, these are stable appearing.  Return in about 3 months (around 09/09/2024) for Routine Foot Care.         Ethan Saddler, DPM [facility] / [facility]

## 2024-06-11 ENCOUNTER — Encounter: Payer: Self-pay | Admitting: Podiatry

## 2024-07-03 DIAGNOSIS — N1831 Chronic kidney disease, stage 3a: Secondary | ICD-10-CM | POA: Diagnosis not present

## 2024-07-03 DIAGNOSIS — E46 Unspecified protein-calorie malnutrition: Secondary | ICD-10-CM | POA: Diagnosis not present

## 2024-07-06 DIAGNOSIS — R319 Hematuria, unspecified: Secondary | ICD-10-CM | POA: Diagnosis not present

## 2024-07-06 DIAGNOSIS — N200 Calculus of kidney: Secondary | ICD-10-CM | POA: Diagnosis not present

## 2024-07-06 DIAGNOSIS — Z6823 Body mass index (BMI) 23.0-23.9, adult: Secondary | ICD-10-CM | POA: Diagnosis not present

## 2024-07-06 DIAGNOSIS — R39198 Other difficulties with micturition: Secondary | ICD-10-CM | POA: Diagnosis not present

## 2024-07-07 DIAGNOSIS — N200 Calculus of kidney: Secondary | ICD-10-CM | POA: Diagnosis not present

## 2024-07-07 DIAGNOSIS — N201 Calculus of ureter: Secondary | ICD-10-CM | POA: Diagnosis not present

## 2024-07-10 DIAGNOSIS — M810 Age-related osteoporosis without current pathological fracture: Secondary | ICD-10-CM | POA: Diagnosis not present

## 2024-07-12 NOTE — Progress Notes (Signed)
  Electrophysiology Office Note:   Date:  07/13/2024  ID:  Abigail Sullivan, DOB August 09, 1946, MRN [id number]  Primary Cardiologist: None Primary Heart Failure: None Electrophysiologist: Kahle Mcqueen Gladis Norton, MD      History of Present Illness:   Abigail Sullivan is a 78 y.o. female with h/o PACs, CVA, atrial fibrillation, hypertension, hyperlipidemia seen today for routine electrophysiology followup.   Since last being seen in our clinic the patient reports doing overall well.  She has no chest pain or shortness of breath.  She is able to do all of her daily activities without restriction.  She has no awareness of her atrial fibrillation.  she denies chest pain, palpitations, dyspnea, PND, orthopnea, nausea, vomiting, dizziness, syncope, edema, weight gain, or early satiety.   Review of systems complete and found to be negative unless listed in HPI.   EP Information / Studies Reviewed:    EKG is ordered today. Personal review as below.  EKG Interpretation Date/Time:  Monday July 13 2024 10:43:17 EDT Ventricular Rate:  107 PR Interval:    QRS Duration:  78 QT Interval:  362 QTC Calculation: 483 R Axis:   51  Text Interpretation: Atrial fibrillation with rapid ventricular response When compared with ECG of 26-Jul-2023 09:25, Nonspecific T wave abnormality no longer evident in Inferior leads Nonspecific T wave abnormality, improved in Lateral leads Confirmed by Kingslee Dowse ([id number]) on 07/13/2024 10:53:28 AM    Risk Assessment/Calculations:    CHA2DS2-VASc Score = 7   This indicates a 11.2% annual risk of stroke. The patient's score is based upon: CHF History: 0 HTN History: 1 Diabetes History: 0 Stroke History: 2 Vascular Disease History: 1 Age Score: 2 Gender Score: 1            Physical Exam:   VS:  BP 110/74   Pulse (!) 107   Ht 5' 5 (1.651 m)   Wt 137 lb 6.4 oz (62.3 kg)   SpO2 96%   BMI 22.86 kg/m    Wt Readings from Last 3 Encounters:  07/13/24 137 lb 6.4 oz (62.3  kg)  07/26/23 129 lb (58.5 kg)  06/04/22 123 lb 9.6 oz (56.1 kg)     GEN: Well nourished, well developed in no acute distress NECK: No JVD; No carotid bruits CARDIAC: Irregularly irregular rate and rhythm, no murmurs, rubs, gallops RESPIRATORY:  Clear to auscultation without rales, wheezing or rhonchi  ABDOMEN: Soft, non-tender, non-distended EXTREMITIES:  No edema; No deformity   ASSESSMENT AND PLAN:    1.  Permanent atrial fibrillation: Heart rate is usually well-controlled.  It is elevated today but has come down nicely with rest.  She is not very active.  Her ejection fraction is normal.  [name] continue with current management.  Heart rate goal average less than 100.  2.  Hypertension: Well-controlled  3.  Hyperlipidemia: Continue medications per PCP  4.  Secondary to coagulable state: On Eliquis   Follow up with EP Team in 12 months  Signed, Kameran Lallier Gladis Norton, MD

## 2024-07-13 ENCOUNTER — Ambulatory Visit: Attending: Cardiology | Admitting: Cardiology

## 2024-07-13 ENCOUNTER — Encounter: Payer: Self-pay | Admitting: Cardiology

## 2024-07-13 VITALS — BP 110/74 | HR 107 | Ht 65.0 in | Wt 137.4 lb

## 2024-07-13 DIAGNOSIS — I10 Essential (primary) hypertension: Secondary | ICD-10-CM | POA: Insufficient documentation

## 2024-07-13 DIAGNOSIS — I4819 Other persistent atrial fibrillation: Secondary | ICD-10-CM | POA: Diagnosis not present

## 2024-07-13 DIAGNOSIS — D6869 Other thrombophilia: Secondary | ICD-10-CM | POA: Insufficient documentation

## 2024-07-13 MED ORDER — NITROGLYCERIN 0.4 MG SL SUBL: SUBLINGUAL_TABLET | SUBLINGUAL | 3 refills | Status: AC

## 2024-07-13 NOTE — Patient Instructions (Addendum)
 Medication Instructions:  Your physician recommends that you continue on your current medications as directed. Please refer to the Current Medication list given to you today.  *If you need a refill on your cardiac medications before your next appointment, please call your pharmacy*  Lab Work: None ordered.  You may go to any [facility] Location for your lab work:  [facility] - [address] ([facility]) - [address] [phone] [address]  [place] - [address]   [place]  - [address]   [place] - [address] - [address] [employer]  - [address] - [address] ([facility]   If you have labs (blood work) drawn today and your tests are completely normal, you will receive your results only by: [employer] (if you have MyChart)  If you have any lab test that is abnormal or we need to change your treatment, we will call you or send a MyChart message to review the results.  Testing/Procedures: None ordered.  Follow-Up: At [facility], you and your health needs are our priority.  As part of our continuing mission to provide you with exceptional heart care, we have created designated Provider Care Teams.  These Care Teams include your primary Cardiologist (physician) and Advanced Practice Providers (APPs -  Physician Assistants and Nurse Practitioners) who all work together to provide you with the care you need, when you need it.  We recommend signing up for the patient portal called MyChart.  Sign up information is provided on this After Visit Summary.  MyChart is used to connect with patients for Virtual Visits (Telemedicine).  Patients are able to view lab/test results, encounter notes, upcoming appointments, etc.  Non-urgent messages can be sent to your provider as well.   To learn more about what you can do with MyChart, go to  [email or web address].    Your next appointment:   1 year(s)  The format for your next appointment:   In Person  Provider:   Delon Hoover, NP

## 2024-07-24 DIAGNOSIS — R351 Nocturia: Secondary | ICD-10-CM | POA: Diagnosis not present

## 2024-07-24 DIAGNOSIS — R3915 Urgency of urination: Secondary | ICD-10-CM | POA: Diagnosis not present

## 2024-07-24 DIAGNOSIS — N201 Calculus of ureter: Secondary | ICD-10-CM | POA: Diagnosis not present

## 2024-07-24 DIAGNOSIS — R310 Gross hematuria: Secondary | ICD-10-CM | POA: Diagnosis not present

## 2024-07-29 ENCOUNTER — Telehealth: Payer: Self-pay | Admitting: Cardiology

## 2024-07-29 ENCOUNTER — Other Ambulatory Visit: Payer: Self-pay | Admitting: Urology

## 2024-07-29 NOTE — Telephone Encounter (Signed)
   Pre-operative Risk Assessment    Patient Name: Abigail Sullivan  DOB: 1946/03/01 MRN: [id number]   Date of last office visit: 07/13/24 Date of next office visit: Not yet scheduled   Request for Surgical Clearance    Procedure:  Extracorporeal Shockwave Lithotripsy on the Left Side  Date of Surgery:  Clearance 08/07/24                                Surgeon:  Dr. Ronal Leeroy Shank Surgeon's Group or Practice Name:  [facility] Phone number:  [phone]  Fax number:  [phone]   Type of Clearance Requested:   - Medical  - Pharmacy:  Hold Apixaban  (Eliquis )     Type of Anesthesia:  Local    Additional requests/questions:  Caller Preston) stated patient will need to stop Eliquis  3 days prior to procedure.    Signed, Jasmin B Wilson   07/29/2024, 4:08 PM

## 2024-07-29 NOTE — Telephone Encounter (Deleted)
 Pharmacy please advise on holding Eliquis  prior to lithotripsy scheduled for 08/07/2024. Thank you.

## 2024-07-30 ENCOUNTER — Encounter (HOSPITAL_COMMUNITY): Payer: Self-pay | Admitting: Urology

## 2024-07-30 NOTE — Telephone Encounter (Signed)
   Patient Name: Abigail Sullivan  DOB: Jul 30, 1946 MRN: [id number]  Primary Cardiologist: None  Chart reviewed as part of pre-operative protocol coverage. Given past medical history and time since last visit, based on ACC/AHA guidelines, Abigail Sullivan is at acceptable risk for the planned procedure without further cardiovascular testing.   The patient was advised that if she develops new symptoms prior to surgery to contact our office to arrange for a follow-up visit, and she verbalized understanding.  Per office protocol, patient can hold Eliquis  for 3 days prior to procedure.   I will route this recommendation to the requesting party via Epic fax function and remove from pre-op pool.  Please call with questions.  Wyn Raddle, Jackee Shove, NP 07/30/2024, 4:36 PM

## 2024-07-30 NOTE — Progress Notes (Signed)
 Spoke w/ via phone for pre-op interview--patient Lab needs dos-KUB      Lab results------ COVID test -Not indicated-patient states asymptomatic no test needed Arrive at --0915 NPO after MN  Pre-Surgery Ensure or G2:  Med rec completed Medications to take morning of surgery --all as needed, except Eliquis , supplements   GLP1 agonist last dose: GLP1 instructions:  Patient instructed no nail polish to be worn day of surgery Patient instructed to bring photo id and insurance card day of surgery Patient aware to have Driver (ride ) / caregiver    for 24 hours after surgery -spouseGLENWOOD Loving [phone]  Patient Special Instructions ---Take laxative of choice on Thursday, hydrate well, eat light meal that evening, no metal from waist down, no flip flops, sandals, bring blue folder from [facility] Pre-Op special Instructions --Per Norita Dustman / Litho  Patient verbalized understanding of instructions that were given at this phone interview. Patient denies chest pain, sob, fever, cough at the interview.

## 2024-07-30 NOTE — Telephone Encounter (Signed)
 Patient with diagnosis of afib on Eliquis  for anticoagulation.    Procedure:  Extracorporeal Shockwave Lithotripsy on the Left Side  Date of procedure: 08/07/24   CHA2DS2-VASc Score = 7   This indicates a 11.2% annual risk of stroke. The patient's score is based upon: CHF History: 0 HTN History: 1 Diabetes History: 0 Stroke History: 2 Vascular Disease History: 1 Age Score: 2 Gender Score: 1      CrCl 51 ml/min Platelet count 250  Patient has not had an Afib/aflutter ablation within the last 3 months or DCCV within the last 30 days  Per office protocol, patient can hold Eliquis  for 3 days prior to procedure.  Patient previously cleared to hold Eliquis  for 3 days  **This guidance is not considered finalized until pre-operative APP has relayed final recommendations.**

## 2024-08-03 DIAGNOSIS — N1831 Chronic kidney disease, stage 3a: Secondary | ICD-10-CM | POA: Diagnosis not present

## 2024-08-03 DIAGNOSIS — E46 Unspecified protein-calorie malnutrition: Secondary | ICD-10-CM | POA: Diagnosis not present

## 2024-08-06 NOTE — H&P (Signed)
 cc: Nocturia. OAB, ureteral calculus   04/05/23: 78 year old woman comes in with longstanding history of nocturia 1-3 times a night. Most often she wakes twice at night during the hours of 10:30 PM and 7:30 AM. She does not snore. She does not drink a lot of fluids during the day. She stops drinking around 5:30 PM but does take a few sips of water with her pills at 10:30 PM. She does not drink water during the day but will have a few swings of Coke. She has some urge urinary incontinence associated with nocturia. She denies UTIs or Szabo hematuria. She has a remote history of kidney stones.   Overactive bladder validate question screener: 1, 0, 0, 2, 3, 3, 3, 3=15/40   07/19/2023: 78 year old woman with a history of nocturia, urgency and urge incontinence here for follow-up after trial of Myrbetriq. She reports 90% improvement in symptoms. She does have an occasional episode of incontinence on her way to the bathroom. Overall she is very satisfied with the improvement and would like to stay on the medication.   01/24/24: 78 year old woman with a history of nocturia 1-3 times a night, urgency and urge incontinence here for follow-up. She has been on Myrbetriq for the last several months and initially did well but says it is now stopped working. She will go all day without voiding and then have to void 1-3 times overnight. She stopped drinking fluids around 4 PM. Her PVR is 254 cc today.   05/11/24: 78 year old woman with a history of nocturia, urgency and urge incontinence here for follow-up. She has previously tried [employer] and is now using Gemtesa. Her nocturia has cut down to 1 time at night. She has a persistently elevated PVR in the 200s. She continues to have urge incontinence during the day.   07/24/24: 78 year old woman with a history of nocturia, urgency and urge incontinence returns after an episode of Sheard hematuria. She subsequently had a renal ultrasound at an outside facility and it showed a 9  mm right UPJ calculus. Pain is controlled.     ALLERGIES: nka/nkda    MEDICATIONS: Gemtesa 75 MG Tablet 1 tablet PO Daily  Simvastatin 20 MG Tablet  Acyclovir 200 MG Capsule  dilTIAZem  HCl ER Coated Beads 180 MG Capsule Extended Release 24 Hour  Eliquis  5 MG Tablet  Gabapentin 600 MG Tablet  HYDROcodone-Acetaminophen 10-325 MG Tablet  LORazepam  1 MG Tablet  Losartan Potassium 25 MG Tablet  Ursodiol 300 MG Capsule     GU PSH: No GU PSH      PSH Notes: back surgery- 2010  wrist surgery- 2023   NON-GU PSH: Visit Complexity (formerly GPC1X) - 05/11/2024, 01/24/2024     GU PMH: Nocturia - 05/11/2024, - 01/24/2024, - 07/19/2023, - 05/29/2023, - 04/05/2023 Urge incontinence - 05/11/2024, - 01/24/2024, - 07/19/2023, - 05/29/2023, - 04/05/2023 Urinary Urgency - 05/11/2024, - 01/24/2024, - 07/19/2023, - 05/29/2023, - 04/05/2023    NON-GU PMH: Absolute glaucoma, unspecified eye Arthritis Atrial Fibrillation Heart disease, unspecified Hypercholesterolemia Liver Disease Other secondary hypertension Stroke/TIA    FAMILY HISTORY: No Family History    SOCIAL HISTORY: Marital Status: Single Current Smoking Status: Patient does not smoke anymore. Has not smoked since 04/02/1973. Smoked for 5 years.   Tobacco Use Assessment Completed: Used Tobacco in last 30 days? Has never drank.  Drinks 2 caffeinated drinks per day.    REVIEW OF SYSTEMS:    GU Review Female:   Patient denies frequent urination, hard to postpone urination, burning /pain  with urination, get up at night to urinate, leakage of urine, stream starts and stops, trouble starting your stream, have to strain to urinate, and being pregnant.  Gastrointestinal (Upper):   Patient denies nausea, vomiting, and indigestion/ heartburn.  Gastrointestinal (Lower):   Patient denies diarrhea and constipation.  Constitutional:   Patient denies fever, night sweats, weight loss, and fatigue.  Skin:   Patient denies skin rash/ lesion and itching.  Eyes:    Patient denies blurred vision and double vision.  Ears/ Nose/ Throat:   Patient denies sore throat and sinus problems.  Hematologic/Lymphatic:   Patient denies swollen glands and easy bruising.  Cardiovascular:   Patient denies leg swelling and chest pains.  Respiratory:   Patient denies cough and shortness of breath.  Endocrine:   Patient denies excessive thirst.  Musculoskeletal:   Patient denies back pain and joint pain.  Neurological:   Patient denies headaches and dizziness.  Psychologic:   Patient denies depression and anxiety.   Notes: Blood in urine since august the 3.    VITAL SIGNS:      07/24/2024 11:28 AM  BP 144/79 mmHg  Pulse 90 /min  Temperature 97.3 F / 36.2 C   MULTI-SYSTEM PHYSICAL EXAMINATION:    Constitutional: Well-nourished. No physical deformities. Normally developed. Good grooming.  Neck: Neck symmetrical, not swollen. Normal tracheal position.  Respiratory: No labored breathing, no use of accessory muscles.   Skin: No paleness, no jaundice, no cyanosis. No lesion, no ulcer, no rash.  Neurologic / Psychiatric: Oriented to time, oriented to place, oriented to person. No depression, no anxiety, no agitation.  Eyes: Normal conjunctivae. Normal eyelids.  Ears, Nose, Mouth, and Throat: Left ear no scars, no lesions, no masses. Right ear no scars, no lesions, no masses. Nose no scars, no lesions, no masses. Normal hearing. Normal lips.  Musculoskeletal: Normal gait and station of head and neck.     Complexity of Data:  Records Review:   Previous Patient Records, POC Tool  X-Ray Review: Outside Ultrasound: Reviewed Report. Discussed With Patient.  KUB: Reviewed Films. Discussed With Patient. 9 mm left mid uretearl calculus clearly seen    PROCEDURES:         KUB - [id number]  A single view of the abdomen is obtained.      Patient confirmed No Neulasta OnPro Device.     ASSESSMENT:      ICD-10 Details  1 GU:   Urge incontinence - N39.41 Chronic, Stable  2    Urinary Urgency - R39.15 Chronic, Stable  3   Nocturia - R35.1 Chronic, Stable  4   Ureteral calculus - N20.1 Acute, Uncomplicated  5   Berish hematuria - R31.0 Acute, Uncomplicated   PLAN:           Orders X-Rays: KUB          Schedule Return Visit/Planned Activity: Next Available Appointment - Urodynamics          Document Letter(s):  Created for Patient: Clinical Summary         Notes:   1. Urolithiasis:  - Clearly visible left mid ureteral calculus  - Patient is not experiencing any pain and would like to move forward with ESWL  - She will need cardiology clearance to hold her Eliquis   - Risks and benefits discussed including but elevated to pain, bleeding, infection, inability to fragment stone, need for additional treatment, damage to surrounding structures, ureteral stricture, retroperitoneal hematoma   2. Overactive bladder: She will  schedule urodynamics   Schedule ESWL. She does need urinalysis and culture sent prior to.   cc: Dr. Landry Georgi        Next Appointment:      Next Appointment: 09/09/2024 09:15 AM    Appointment Type: Urodynamics    Location: [facility] [phone]    Provider: [facility] U[facility]    Reason for Visit: NXT AVAIL UDS

## 2024-08-07 ENCOUNTER — Ambulatory Visit (HOSPITAL_COMMUNITY)
Admission: RE | Admit: 2024-08-07 | Discharge: 2024-08-07 | Disposition: A | Discharge status: Home / Self Care | Attending: Urology | Admitting: Urology

## 2024-08-07 ENCOUNTER — Ambulatory Visit (HOSPITAL_COMMUNITY)

## 2024-08-07 ENCOUNTER — Encounter (HOSPITAL_COMMUNITY): Payer: Self-pay | Admitting: Urology

## 2024-08-07 ENCOUNTER — Encounter (HOSPITAL_COMMUNITY)
Admission: RE | Disposition: A | Discharge status: Home / Self Care | Payer: Self-pay | Source: Home / Self Care | Attending: Urology

## 2024-08-07 ENCOUNTER — Other Ambulatory Visit: Payer: Self-pay

## 2024-08-07 DIAGNOSIS — M419 Scoliosis, unspecified: Secondary | ICD-10-CM | POA: Diagnosis not present

## 2024-08-07 DIAGNOSIS — Z538 Procedure and treatment not carried out for other reasons: Secondary | ICD-10-CM | POA: Insufficient documentation

## 2024-08-07 DIAGNOSIS — N2889 Other specified disorders of kidney and ureter: Secondary | ICD-10-CM | POA: Diagnosis not present

## 2024-08-07 DIAGNOSIS — B9620 Unspecified Escherichia coli [E. coli] as the cause of diseases classified elsewhere: Secondary | ICD-10-CM | POA: Insufficient documentation

## 2024-08-07 DIAGNOSIS — N201 Calculus of ureter: Secondary | ICD-10-CM | POA: Diagnosis not present

## 2024-08-07 DIAGNOSIS — Z96642 Presence of left artificial hip joint: Secondary | ICD-10-CM | POA: Diagnosis not present

## 2024-08-07 DIAGNOSIS — M51369 Other intervertebral disc degeneration, lumbar region without mention of lumbar back pain or lower extremity pain: Secondary | ICD-10-CM | POA: Diagnosis not present

## 2024-08-07 HISTORY — PX: EXTRACORPOREAL SHOCK WAVE LITHOTRIPSY: SHX1557

## 2024-08-07 HISTORY — DX: Acute myocardial infarction, unspecified: I21.9

## 2024-08-07 LAB — URINALYSIS, ROUTINE W REFLEX MICROSCOPIC
Bilirubin Urine: NEGATIVE
Glucose, UA: NEGATIVE mg/dL
Ketones, ur: NEGATIVE mg/dL
Nitrite: POSITIVE — AB
Protein, ur: 30 mg/dL — AB
RBC / HPF: >50 RBC/hpf (ref 0–5)
Specific Gravity, Urine: 1.013 (ref 1.005–1.030)
pH: 5 (ref 5.0–8.0)

## 2024-08-07 SURGERY — LITHOTRIPSY, ESWL
Anesthesia: LOCAL | Laterality: Left

## 2024-08-07 MED ORDER — DIPHENHYDRAMINE HCL 25 MG PO CAPS
25.0000 mg | ORAL_CAPSULE | ORAL | Status: AC
  Administered 2024-08-07: 25 mg via ORAL
  Filled 2024-08-07: qty 1

## 2024-08-07 MED ORDER — SODIUM CHLORIDE 0.9 % IV SOLN: INTRAVENOUS | Status: DC

## 2024-08-07 MED ORDER — DIAZEPAM 5 MG PO TABS: 10.0000 mg | ORAL_TABLET | ORAL | Status: DC

## 2024-08-07 MED ORDER — CIPROFLOXACIN HCL 500 MG PO TABS
500.0000 mg | ORAL_TABLET | ORAL | Status: AC
  Administered 2024-08-07: 500 mg via ORAL
  Filled 2024-08-07: qty 1

## 2024-08-07 MED ORDER — CEPHALEXIN 500 MG PO CAPS: 500.0000 mg | ORAL_CAPSULE | Freq: Three times a day (TID) | ORAL | 0 refills | Status: AC

## 2024-08-07 MED ORDER — BISACODYL 5 MG PO TBEC: 5.0000 mg | DELAYED_RELEASE_TABLET | Freq: Every day | ORAL | Status: DC | PRN

## 2024-08-07 MED ORDER — DIAZEPAM 5 MG PO TABS
5.0000 mg | ORAL_TABLET | ORAL | Status: AC
  Administered 2024-08-07: 5 mg via ORAL
  Filled 2024-08-07: qty 1

## 2024-08-07 NOTE — Interval H&P Note (Signed)
 History and Physical Interval Note: A cath urine was obtained today and she was noted to have a grossly infected urine which I will send for culture.    We will need to cancel ESWL today.    08/07/2024 10:52 AM  Abigail Sullivan  has presented today for surgery, with the diagnosis of LEFT URTERAL CALCULUS.  The various methods of treatment have been discussed with the patient and family. After consideration of risks, benefits and other options for treatment, the patient has consented to  Procedure(s) with comments: LITHOTRIPSY, ESWL (Left) - LEFT EXTRACORPOREAL SHOCKWAVE LITHOTRISPY as a surgical intervention.  The patient's history has been reviewed, patient examined, no change in status, stable for surgery.  I have reviewed the patient's chart and labs.  Questions were answered to the patient's satisfaction.     Edana Aguado

## 2024-08-07 NOTE — Progress Notes (Signed)
 In and out catheter done for urinalysis after time out with Verneita Das NT. Returned 300 ML cloudy, dark yellow urine.

## 2024-08-08 ENCOUNTER — Other Ambulatory Visit: Payer: Self-pay | Admitting: Cardiology

## 2024-08-10 ENCOUNTER — Encounter (HOSPITAL_COMMUNITY): Payer: Self-pay | Admitting: Urology

## 2024-08-10 ENCOUNTER — Other Ambulatory Visit: Payer: Self-pay | Admitting: Urology

## 2024-08-11 DIAGNOSIS — N201 Calculus of ureter: Secondary | ICD-10-CM | POA: Diagnosis not present

## 2024-08-11 DIAGNOSIS — R310 Gross hematuria: Secondary | ICD-10-CM | POA: Diagnosis not present

## 2024-08-11 LAB — URINE CULTURE: Culture: >=100000 — AB

## 2024-08-12 ENCOUNTER — Other Ambulatory Visit: Payer: Self-pay | Admitting: Student

## 2024-08-12 DIAGNOSIS — I480 Paroxysmal atrial fibrillation: Secondary | ICD-10-CM

## 2024-08-12 NOTE — Telephone Encounter (Signed)
 Prescription refill request for Eliquis  received. Indication:afib Last office visit:8/25 Scr:0.89 2025 Age: 78 Weight:62.1  kg  Prescription refilled

## 2024-08-17 DIAGNOSIS — R197 Diarrhea, unspecified: Secondary | ICD-10-CM | POA: Diagnosis not present

## 2024-08-17 DIAGNOSIS — N939 Abnormal uterine and vaginal bleeding, unspecified: Secondary | ICD-10-CM | POA: Diagnosis not present

## 2024-08-21 ENCOUNTER — Encounter (HOSPITAL_COMMUNITY): Payer: Self-pay | Admitting: Urology

## 2024-08-21 NOTE — Progress Notes (Signed)
 Spoke w/ via phone for pre-op interview---[facility]iel[facility] Lab needs dos---- KUB              COVID test -----patient states asymptomatic no test needed Arrive at -------0915 NPO after MN NO Solid Food.  Clear liquids from MN until--- Med rec completed. Pt aware to hold ASA/NSAIDs and supplements per [facility] protocol.  Medications to take morning of surgery -----oxycontin, activan, gabapentin, zocor, cozaar, zovirax, tiazac , veozah, actigall, systane, gemtesa Diabetic/Weight loss medication ----- No Alcohol  or recreational drugs for 24 hours/Tobacco products for 6 hours ---- Patient instructed to bring blue lithotripsy folder, photo id and insurance card day of surgery. Patient aware to have Driver (ride ) / caregiver for 24 hours after surgery -----Ed Patient Special Instructions -----wear comfortable clothes and bring blue folder Pre-Op special Instructions ----- take laxative of choice day before procedure Patient verbalized understanding of instructions that were given at this phone interview. Patient denies shortness of breath, chest pain, fever, cough at this phone interview.

## 2024-08-24 ENCOUNTER — Encounter (HOSPITAL_COMMUNITY)
Admission: RE | Disposition: A | Discharge status: Home / Self Care | Payer: Self-pay | Source: Home / Self Care | Attending: Urology

## 2024-08-24 ENCOUNTER — Other Ambulatory Visit: Payer: Self-pay

## 2024-08-24 ENCOUNTER — Encounter (HOSPITAL_COMMUNITY): Payer: Self-pay | Admitting: Urology

## 2024-08-24 ENCOUNTER — Ambulatory Visit (HOSPITAL_COMMUNITY)
Admission: RE | Admit: 2024-08-24 | Discharge: 2024-08-24 | Disposition: A | Discharge status: Home / Self Care | Attending: Urology | Admitting: Urology

## 2024-08-24 ENCOUNTER — Ambulatory Visit (HOSPITAL_COMMUNITY)

## 2024-08-24 DIAGNOSIS — N200 Calculus of kidney: Secondary | ICD-10-CM

## 2024-08-24 DIAGNOSIS — N202 Calculus of kidney with calculus of ureter: Secondary | ICD-10-CM | POA: Diagnosis not present

## 2024-08-24 DIAGNOSIS — Z8673 Personal history of transient ischemic attack (TIA), and cerebral infarction without residual deficits: Secondary | ICD-10-CM | POA: Diagnosis not present

## 2024-08-24 DIAGNOSIS — I252 Old myocardial infarction: Secondary | ICD-10-CM | POA: Insufficient documentation

## 2024-08-24 DIAGNOSIS — N201 Calculus of ureter: Secondary | ICD-10-CM | POA: Diagnosis not present

## 2024-08-24 DIAGNOSIS — I10 Essential (primary) hypertension: Secondary | ICD-10-CM | POA: Insufficient documentation

## 2024-08-24 DIAGNOSIS — Z8249 Family history of ischemic heart disease and other diseases of the circulatory system: Secondary | ICD-10-CM | POA: Diagnosis not present

## 2024-08-24 DIAGNOSIS — Z96642 Presence of left artificial hip joint: Secondary | ICD-10-CM | POA: Diagnosis not present

## 2024-08-24 HISTORY — DX: Personal history of urinary calculi: Z87.442

## 2024-08-24 HISTORY — PX: EXTRACORPOREAL SHOCK WAVE LITHOTRIPSY: SHX1557

## 2024-08-24 SURGERY — LITHOTRIPSY, ESWL
Anesthesia: LOCAL | Laterality: Left

## 2024-08-24 MED ORDER — MORPHINE SULFATE (PF) 2 MG/ML IV SOLN
INTRAVENOUS | Status: AC
  Filled 2024-08-24: qty 1

## 2024-08-24 MED ORDER — DIPHENHYDRAMINE HCL 25 MG PO CAPS
25.0000 mg | ORAL_CAPSULE | ORAL | Status: AC
Start: 2024-08-24 — End: 2024-08-24
  Administered 2024-08-24: 25 mg via ORAL
  Filled 2024-08-24: qty 1

## 2024-08-24 MED ORDER — ONDANSETRON HCL 4 MG/2ML IJ SOLN
4.0000 mg | Freq: Once | INTRAMUSCULAR | Status: AC
  Administered 2024-08-24: 4 mg via INTRAVENOUS

## 2024-08-24 MED ORDER — BISACODYL 5 MG PO TBEC: 5.0000 mg | DELAYED_RELEASE_TABLET | Freq: Every day | ORAL | Status: DC | PRN

## 2024-08-24 MED ORDER — OXYCODONE-ACETAMINOPHEN 10-325 MG PO TABS: 1.0000 | ORAL_TABLET | Freq: Four times a day (QID) | ORAL | 0 refills | Status: AC | PRN

## 2024-08-24 MED ORDER — TAMSULOSIN HCL 0.4 MG PO CAPS: 0.4000 mg | ORAL_CAPSULE | Freq: Every day | ORAL | 0 refills | Status: AC

## 2024-08-24 MED ORDER — KETOROLAC TROMETHAMINE 30 MG/ML IJ SOLN
30.0000 mg | Freq: Once | INTRAMUSCULAR | Status: AC
  Administered 2024-08-24: 30 mg via INTRAVENOUS

## 2024-08-24 MED ORDER — MORPHINE SULFATE (PF) 2 MG/ML IV SOLN
2.0000 mg | Freq: Once | INTRAVENOUS | Status: AC
  Administered 2024-08-24: 2 mg via INTRAVENOUS

## 2024-08-24 MED ORDER — DIAZEPAM 5 MG PO TABS: 10.0000 mg | ORAL_TABLET | ORAL | Status: DC

## 2024-08-24 MED ORDER — DIAZEPAM 5 MG PO TABS
5.0000 mg | ORAL_TABLET | ORAL | Status: AC
  Administered 2024-08-24: 5 mg via ORAL
  Filled 2024-08-24: qty 1

## 2024-08-24 MED ORDER — CIPROFLOXACIN HCL 500 MG PO TABS
500.0000 mg | ORAL_TABLET | ORAL | Status: AC
  Administered 2024-08-24: 500 mg via ORAL
  Filled 2024-08-24: qty 1

## 2024-08-24 MED ORDER — SODIUM CHLORIDE 0.9 % IV SOLN
INTRAVENOUS | Status: DC
  Administered 2024-08-24: 500 mL via INTRAVENOUS
  Administered 2024-08-24: 1000 mL via INTRAVENOUS

## 2024-08-24 MED ORDER — ONDANSETRON HCL 4 MG/2ML IJ SOLN
INTRAMUSCULAR | Status: AC
  Filled 2024-08-24: qty 2

## 2024-08-24 MED ORDER — KETOROLAC TROMETHAMINE 30 MG/ML IJ SOLN
INTRAMUSCULAR | Status: AC
  Filled 2024-08-24: qty 1

## 2024-08-24 NOTE — Discharge Instructions (Signed)
Activity:  You are encouraged to ambulate frequently (about every hour during waking hours) to help prevent blood clots from forming in your legs or lungs.   ? ?Diet: You should advance your diet as instructed by your physician.  It will be normal to have some bloating, nausea, and abdominal discomfort intermittently. ? ?Prescriptions:  You will be provided a prescription for pain medication to take as needed.  If your pain is not severe enough to require the prescription pain medication, you may take extra strength Tylenol instead which will have less side effects.  You should also take a prescribed stool softener to avoid straining with bowel movements as the prescription pain medication may constipate you. ? ?What to call us about: You should call the office ([phone]) if you develop fever > 101 or develop persistent vomiting. Activity:  You are encouraged to ambulate frequently (about every hour during waking hours) to help prevent blood clots from forming in your legs or lungs.  ?

## 2024-08-24 NOTE — H&P (Signed)
 Urology Preoperative H&P   Chief Complaint: RIGHT proximal ureteral/renal stone  History of Present Illness: Abigail Sullivan is a 78 y.o. female with a RIGHT proximal ureteral/renal stone seen on KUB today.   History reviewed with patient. Prior KUB last month revealed possible left ureteral stone however this was not seen on recent KUB. She denies abdominal pain, flank pain, fevers, chills, dysuria. She did have a UTI earlier this month that resolved with antibiotics. She is asymptomatic for infection today. She wants to proceed with treating her right proximal ureteral/renal stone today. Plan reviewed with Ubaldo Eagles and Dr. Elisabeth.    Past Medical History:  Diagnosis Date   Atrial fibrillation (HCC)    post-operative    Hepatitis    History of kidney stones    Hyperlipidemia    Hypertension    Myocardial infarction (HCC)    Premature atrial contractions    systomatic PAC's and documented SVT ( likley atrial tachycardia )   Stroke (cerebrum) [facility])     Past Surgical History:  Procedure Laterality Date   EXTRACORPOREAL SHOCK WAVE LITHOTRIPSY Left 08/07/2024   Procedure: LITHOTRIPSY, ESWL;  Surgeon: Watt Rush, MD;  Location: [facility];  Service: Urology;  Laterality: Left;  LEFT EXTRACORPOREAL SHOCKWAVE LITHOTRISPY   HEMORROIDECTOMY     LOOP RECORDER INSERTION N/A 09/16/2018   Procedure: LOOP RECORDER INSERTION;  Surgeon: Kelsie Agent, MD;  Location: [facility];  Service: Cardiovascular;  Laterality: N/A;   LUMBAR LAMINECTOMY/DECOMPRESSION MICRODISCECTOMY  02/04/2011   L2-L3 and L3-L4 with diskectomy and plating   SHOULDER ARTHROSCOPY W/ ROTATOR CUFF REPAIR Left    TOTAL HIP ARTHROPLASTY Left     Allergies: No Known Allergies  Family History  Problem Relation Age of Onset   Hypertension Mother    Other Father        CABGx4   Breast cancer Neg Hx     Social History:  reports that she quit smoking about 7 years ago. Her smoking use included cigarettes. She has never  used smokeless tobacco. She reports that she does not drink alcohol  and does not use drugs.  ROS: A complete review of systems was performed.  All systems are negative except for pertinent findings as noted.  Physical Exam:  Vital signs in last 24 hours: Temp:  [98.1 F (36.7 C)] 98.1 F (36.7 C) (09/22 1007) Pulse Rate:  [115] 115 (09/22 1007) Resp:  [18] 18 (09/22 1007) BP: (154)/(97) 154/97 (09/22 1007) SpO2:  [98 %] 98 % (09/22 1007) Weight:  [62.6 kg] 62.6 kg (09/22 1007) Constitutional:  Alert and oriented, No acute distress Cardiovascular: Regular rate and rhythm Respiratory: Normal respiratory effort, Lungs clear bilaterally GI: Abdomen is soft, nontender, nondistended, no abdominal masses GU: No CVA tenderness Lymphatic: No lymphadenopathy Neurologic: Grossly intact, no focal deficits Psychiatric: Normal mood and affect  Laboratory Data:  No results for input(s): WBC, HGB, HCT, PLT in the last 72 hours.  No results for input(s): NA, K, CL, GLUCOSE, BUN, CALCIUM, CREATININE in the last 72 hours.  Invalid input(s): CO3   No results found for this or any previous visit (from the past 24 hours). No results found for this or any previous visit (from the past 240 hours).  Renal Function: No results for input(s): CREATININE in the last 168 hours. CrCl cannot be calculated (Patient's most recent lab result is older than the maximum 21 days allowed.).  Radiologic Imaging: DG Abd 1 View Result Date: 08/24/2024 CLINICAL DATA:  Left ureteral calculus. Patient  reports upcoming right-sided lithotripsy. EXAM: ABDOMEN - 1 VIEW COMPARISON:  Radiograph 08/07/2024 FINDINGS: Stable positioning of suspected right renal calculus. The previous calcification to the left of L2 is not definitively seen on the current exam, may be due to differences in positioning. Vascular calcifications in the pelvis. Normal bowel gas pattern. Moderate colonic stool burden.  Scoliosis with degenerative and postoperative change in the lumbar spine. Left hip arthroplasty. IMPRESSION: 1. Stable positioning of suspected right renal calculus. 2. The previous calcification to the left of L2 is not definitively seen on the current exam, may be due to differences in positioning. Electronically Signed   By: Andrea Gasman M.D.   On: 08/24/2024 11:28    I independently reviewed the above imaging studies.  Assessment and Plan Abigail Sullivan is a 78 y.o. female with a RIGHT proximal ureteral/renal stone.  The risks, benefits and alternatives of RIGHT ESWL was discussed with the patient. I described the risks which include arrhythmia, kidney contusion, kidney hemorrhage, need for transfusion, back discomfort, flank ecchymosis, flank abrasion, inability to fracture the stone, inability to pass stone fragments, Steinstrasse, infection associated with obstructing stones, need for an alternative surgical procedure and possible need for repeat shockwave lithotripsy.  The patient voices understanding and wishes to proceed.       Matt R. Demetra Moya MD 08/24/2024, 11:31 AM  [facility] Pager: [phone]): [phone]

## 2024-08-24 NOTE — Progress Notes (Signed)
 IV removed and patinet attempted to urinate prior to departure.   Pt. Has retention issues and many times not able to void.   MD aware of retention issues.  1445 called and informed Dr. Selma of not being able to void.   Bladder scan completed- 764 cc in bladder.   Order to place foley cath and will leave in till office MD appointment to address retention.     1505 education provided to pt. And husband at bedside of foley cath; how to empty; keep off ground, no loops, and cleaning to prevent infection.

## 2024-08-24 NOTE — Op Note (Signed)
[facility] Operative Note  Treating Physician: Tennessee Perra, MD  Pre-op diagnosis: Right renal stone  Post-op diagnosis: Same   Procedure: Right ESWL  See [facility] OP note scanned into chart. Also because of the size, density, location and other factors that cannot be anticipated I feel this will likely be a staged procedure. This fact supersedes any indication in the scanned [facility] operative note to the contrary.  Matt R. Tomothy Eddins MD [facility]  Pager: [phone]   

## 2024-08-25 ENCOUNTER — Encounter (HOSPITAL_COMMUNITY): Payer: Self-pay | Admitting: Urology

## 2024-09-02 DIAGNOSIS — G609 Hereditary and idiopathic neuropathy, unspecified: Secondary | ICD-10-CM | POA: Diagnosis not present

## 2024-09-02 DIAGNOSIS — E46 Unspecified protein-calorie malnutrition: Secondary | ICD-10-CM | POA: Diagnosis not present

## 2024-09-02 DIAGNOSIS — I10 Essential (primary) hypertension: Secondary | ICD-10-CM | POA: Diagnosis not present

## 2024-09-02 DIAGNOSIS — Z23 Encounter for immunization: Secondary | ICD-10-CM | POA: Diagnosis not present

## 2024-09-02 DIAGNOSIS — E782 Mixed hyperlipidemia: Secondary | ICD-10-CM | POA: Diagnosis not present

## 2024-09-02 DIAGNOSIS — N1831 Chronic kidney disease, stage 3a: Secondary | ICD-10-CM | POA: Diagnosis not present

## 2024-09-03 DIAGNOSIS — S37021A Major contusion of right kidney, initial encounter: Secondary | ICD-10-CM | POA: Diagnosis not present

## 2024-09-03 DIAGNOSIS — N201 Calculus of ureter: Secondary | ICD-10-CM | POA: Diagnosis not present

## 2024-09-03 DIAGNOSIS — R338 Other retention of urine: Secondary | ICD-10-CM | POA: Diagnosis not present

## 2024-09-08 ENCOUNTER — Ambulatory Visit: Admitting: Podiatry

## 2024-09-08 ENCOUNTER — Encounter: Payer: Self-pay | Admitting: Podiatry

## 2024-09-08 DIAGNOSIS — B351 Tinea unguium: Secondary | ICD-10-CM | POA: Diagnosis not present

## 2024-09-08 DIAGNOSIS — M79675 Pain in left toe(s): Secondary | ICD-10-CM | POA: Diagnosis not present

## 2024-09-08 DIAGNOSIS — M79674 Pain in right toe(s): Secondary | ICD-10-CM

## 2024-09-08 DIAGNOSIS — D689 Coagulation defect, unspecified: Secondary | ICD-10-CM

## 2024-09-08 NOTE — Progress Notes (Signed)
  Subjective:  Patient ID: Abigail Sullivan, female    DOB: 01/10/1946,  MRN: [id number]  RFC   78 y.o. female presents with the above complaint. History confirmed with patient. Patient presenting with pain related to dystrophic thickened elongated nails. Patient is unable to trim own nails related to nail dystrophy and/or mobility issues. Patient does not have a history of T2DM.  Patient is on Eliquis  for A-fib.  Objective:  Physical Exam: warm, good capillary refill nail exam onychomycosis of the toenails, onycholysis, and dystrophic nails greater than 3 mm thickening DP pulses palpable, PT pulses palpable, and protective sensation intact Left Foot:  Pain with palpation of nails due to elongation and dystrophic growth.  Hammertoes of all digits, semirigid Right Foot: Pain with palpation of nails due to elongation and dystrophic growth.  Hammertoes of all digits, semirigid  Ambulates with cane  Assessment:   1. Pain due to onychomycosis of toenails of both feet   2. Coagulation defect      Plan:  Patient was evaluated and treated and all questions answered.   #Onychomycosis with pain  -Nails palliatively debrided as below. -Educated on self-care -Chronic anticoagulation on eliquis   Procedure: Nail Debridement Rationale: Pain Type of Debridement: manual, sharp debridement. Instrumentation: Nail nipper, rotary burr. Number of Nails: 10    Return in about 3 months (around 12/09/2024) for Routine Foot Care.         Ethan Saddler, DPM [facility] / [facility]

## 2024-09-09 DIAGNOSIS — N3946 Mixed incontinence: Secondary | ICD-10-CM | POA: Diagnosis not present

## 2024-09-14 DIAGNOSIS — Z6823 Body mass index (BMI) 23.0-23.9, adult: Secondary | ICD-10-CM | POA: Diagnosis not present

## 2024-09-14 DIAGNOSIS — K745 Biliary cirrhosis, unspecified: Secondary | ICD-10-CM | POA: Diagnosis not present

## 2024-09-14 DIAGNOSIS — N1831 Chronic kidney disease, stage 3a: Secondary | ICD-10-CM | POA: Diagnosis not present

## 2024-09-14 DIAGNOSIS — F3160 Bipolar disorder, current episode mixed, unspecified: Secondary | ICD-10-CM | POA: Diagnosis not present

## 2024-09-14 DIAGNOSIS — E2839 Other primary ovarian failure: Secondary | ICD-10-CM | POA: Diagnosis not present

## 2024-09-28 DIAGNOSIS — H0102A Squamous blepharitis right eye, upper and lower eyelids: Secondary | ICD-10-CM | POA: Diagnosis not present

## 2024-09-28 DIAGNOSIS — H04123 Dry eye syndrome of bilateral lacrimal glands: Secondary | ICD-10-CM | POA: Diagnosis not present

## 2024-09-28 DIAGNOSIS — H43811 Vitreous degeneration, right eye: Secondary | ICD-10-CM | POA: Diagnosis not present

## 2024-09-28 DIAGNOSIS — H0102B Squamous blepharitis left eye, upper and lower eyelids: Secondary | ICD-10-CM | POA: Diagnosis not present

## 2024-09-28 DIAGNOSIS — Z961 Presence of intraocular lens: Secondary | ICD-10-CM | POA: Diagnosis not present

## 2024-09-28 DIAGNOSIS — H53461 Homonymous bilateral field defects, right side: Secondary | ICD-10-CM | POA: Diagnosis not present

## 2024-09-28 DIAGNOSIS — H40013 Open angle with borderline findings, low risk, bilateral: Secondary | ICD-10-CM | POA: Diagnosis not present

## 2024-09-30 DIAGNOSIS — N3946 Mixed incontinence: Secondary | ICD-10-CM | POA: Diagnosis not present

## 2024-09-30 DIAGNOSIS — N200 Calculus of kidney: Secondary | ICD-10-CM | POA: Diagnosis not present

## 2024-10-03 DIAGNOSIS — N1831 Chronic kidney disease, stage 3a: Secondary | ICD-10-CM | POA: Diagnosis not present

## 2024-10-03 DIAGNOSIS — E46 Unspecified protein-calorie malnutrition: Secondary | ICD-10-CM | POA: Diagnosis not present

## 2024-10-31 ENCOUNTER — Encounter: Payer: Self-pay | Admitting: Gastroenterology

## 2024-11-16 DIAGNOSIS — N200 Calculus of kidney: Secondary | ICD-10-CM | POA: Diagnosis not present

## 2024-11-16 DIAGNOSIS — N3946 Mixed incontinence: Secondary | ICD-10-CM | POA: Diagnosis not present

## 2024-12-14 ENCOUNTER — Ambulatory Visit: Admitting: Podiatry

## 2024-12-15 ENCOUNTER — Encounter: Payer: Self-pay | Admitting: Podiatry

## 2024-12-15 ENCOUNTER — Ambulatory Visit: Admitting: Podiatry

## 2024-12-15 DIAGNOSIS — M79675 Pain in left toe(s): Secondary | ICD-10-CM

## 2024-12-15 DIAGNOSIS — D689 Coagulation defect, unspecified: Secondary | ICD-10-CM

## 2024-12-15 DIAGNOSIS — B351 Tinea unguium: Secondary | ICD-10-CM

## 2024-12-15 DIAGNOSIS — M79674 Pain in right toe(s): Secondary | ICD-10-CM

## 2024-12-15 NOTE — Progress Notes (Signed)
"  °  Subjective:  Patient ID: Abigail Sullivan, female    DOB: 1946-11-19,  MRN: [id number]  RFC   79 y.o. female presents with the above complaint. History confirmed with patient. Patient presenting with pain related to dystrophic thickened elongated nails. Patient is unable to trim own nails related to nail dystrophy and/or mobility issues. Patient does not have a history of T2DM.  Patient is on Eliquis  for A-fib.  Objective:  Physical Exam: warm, good capillary refill nail exam onychomycosis of the toenails, onycholysis, and dystrophic nails greater than 3 mm thickening DP pulses palpable, PT pulses palpable, and protective sensation intact Left Foot:  Pain with palpation of nails due to elongation and dystrophic growth.  Hammertoes of all digits, semirigid Right Foot: Pain with palpation of nails due to elongation and dystrophic growth.  Hammertoes of all digits, semirigid  Ambulates with cane  Assessment:   1. Pain due to onychomycosis of toenails of both feet   2. Coagulation defect      Plan:  Patient was evaluated and treated and all questions answered.   #Onychomycosis with pain  -Nails palliatively debrided as below. -Educated on self-care -Chronic anticoagulation on eliquis   Procedure: Nail Debridement Rationale: Pain Type of Debridement: manual, sharp debridement. Instrumentation: Nail nipper, rotary burr. Number of Nails: 10    Return in about 3 months (around 03/15/2025) for Routine Foot Care.         Ethan Saddler, DPM [facility] / [facility]                   "

## 2025-03-23 ENCOUNTER — Ambulatory Visit: Admitting: Podiatry
